# Patient Record
Sex: Male | Born: 1935 | Race: White | Hispanic: No | Marital: Married | Smoking: Never smoker
Health system: Southern US, Community
[De-identification: ages and names within clinical notes are randomized; demographics above are authoritative.]

## PROBLEM LIST (undated history)

## (undated) DIAGNOSIS — R1310 Dysphagia, unspecified: Secondary | ICD-10-CM

## (undated) DIAGNOSIS — G459 Transient cerebral ischemic attack, unspecified: Secondary | ICD-10-CM

## (undated) DIAGNOSIS — M5136 Other intervertebral disc degeneration, lumbar region: Secondary | ICD-10-CM

## (undated) DIAGNOSIS — N183 Chronic kidney disease, stage 3 unspecified: Secondary | ICD-10-CM

## (undated) DIAGNOSIS — E785 Hyperlipidemia, unspecified: Secondary | ICD-10-CM

## (undated) DIAGNOSIS — M545 Low back pain: Secondary | ICD-10-CM

## (undated) DIAGNOSIS — I10 Essential (primary) hypertension: Secondary | ICD-10-CM

## (undated) DIAGNOSIS — K1120 Sialoadenitis, unspecified: Secondary | ICD-10-CM

## (undated) DIAGNOSIS — Z741 Need for assistance with personal care: Secondary | ICD-10-CM

## (undated) DIAGNOSIS — R296 Repeated falls: Secondary | ICD-10-CM

## (undated) DIAGNOSIS — R4182 Altered mental status, unspecified: Secondary | ICD-10-CM

## (undated) DIAGNOSIS — M51369 Other intervertebral disc degeneration, lumbar region without mention of lumbar back pain or lower extremity pain: Secondary | ICD-10-CM

## (undated) DIAGNOSIS — G8929 Other chronic pain: Secondary | ICD-10-CM

## (undated) DIAGNOSIS — E119 Type 2 diabetes mellitus without complications: Secondary | ICD-10-CM

## (undated) DIAGNOSIS — F0390 Unspecified dementia without behavioral disturbance: Secondary | ICD-10-CM

## (undated) DIAGNOSIS — C801 Malignant (primary) neoplasm, unspecified: Secondary | ICD-10-CM

## (undated) DIAGNOSIS — G40909 Epilepsy, unspecified, not intractable, without status epilepticus: Secondary | ICD-10-CM

## (undated) HISTORY — PX: BRAIN TUMOR EXCISION: SHX577

---

## 1997-12-04 ENCOUNTER — Other Ambulatory Visit: Admission: RE | Admit: 1997-12-04 | Discharge: 1997-12-04 | Payer: Self-pay | Admitting: Internal Medicine

## 1998-05-08 ENCOUNTER — Encounter: Payer: Self-pay | Admitting: Internal Medicine

## 1998-05-08 ENCOUNTER — Ambulatory Visit (HOSPITAL_COMMUNITY): Admission: RE | Admit: 1998-05-08 | Discharge: 1998-05-08 | Payer: Self-pay | Admitting: Internal Medicine

## 2000-08-07 HISTORY — PX: BRAIN MENINGIOMA EXCISION: SHX576

## 2001-04-16 ENCOUNTER — Encounter (INDEPENDENT_AMBULATORY_CARE_PROVIDER_SITE_OTHER): Payer: Self-pay | Admitting: Specialist

## 2001-04-16 ENCOUNTER — Ambulatory Visit (HOSPITAL_COMMUNITY): Admission: RE | Admit: 2001-04-16 | Discharge: 2001-04-16 | Payer: Self-pay | Admitting: Gastroenterology

## 2009-05-06 ENCOUNTER — Inpatient Hospital Stay (HOSPITAL_COMMUNITY): Admission: EM | Admit: 2009-05-06 | Discharge: 2009-05-15 | Payer: Self-pay | Admitting: Emergency Medicine

## 2009-05-06 ENCOUNTER — Emergency Department (HOSPITAL_COMMUNITY): Admission: EM | Admit: 2009-05-06 | Discharge: 2009-05-06 | Payer: Self-pay | Admitting: Emergency Medicine

## 2009-06-26 ENCOUNTER — Encounter (HOSPITAL_BASED_OUTPATIENT_CLINIC_OR_DEPARTMENT_OTHER): Admission: RE | Admit: 2009-06-26 | Discharge: 2009-08-14 | Payer: Self-pay | Admitting: Internal Medicine

## 2010-08-25 LAB — GLUCOSE, CAPILLARY: Glucose-Capillary: 104 mg/dL — ABNORMAL HIGH (ref 70–99)

## 2010-09-10 LAB — BASIC METABOLIC PANEL
BUN: 13 mg/dL (ref 6–23)
BUN: 17 mg/dL (ref 6–23)
CO2: 29 mEq/L (ref 19–32)
Calcium: 10.4 mg/dL (ref 8.4–10.5)
Chloride: 101 mEq/L (ref 96–112)
Chloride: 102 mEq/L (ref 96–112)
Creatinine, Ser: 1.71 mg/dL — ABNORMAL HIGH (ref 0.4–1.5)
GFR calc Af Amer: 48 mL/min — ABNORMAL LOW (ref >60)
GFR calc non Af Amer: 39 mL/min — ABNORMAL LOW (ref >60)
GFR calc non Af Amer: 41 mL/min — ABNORMAL LOW (ref >60)
Glucose, Bld: 132 mg/dL — ABNORMAL HIGH (ref 70–99)
Glucose, Bld: 149 mg/dL — ABNORMAL HIGH (ref 70–99)
Glucose, Bld: 150 mg/dL — ABNORMAL HIGH (ref 70–99)
Potassium: 3.9 mEq/L (ref 3.5–5.1)
Potassium: 4.2 mEq/L (ref 3.5–5.1)
Potassium: 4.3 mEq/L (ref 3.5–5.1)
Sodium: 136 mEq/L (ref 135–145)
Sodium: 136 mEq/L (ref 135–145)

## 2010-09-10 LAB — GLUCOSE, CAPILLARY
Glucose-Capillary: 107 mg/dL — ABNORMAL HIGH (ref 70–99)
Glucose-Capillary: 121 mg/dL — ABNORMAL HIGH (ref 70–99)
Glucose-Capillary: 123 mg/dL — ABNORMAL HIGH (ref 70–99)
Glucose-Capillary: 125 mg/dL — ABNORMAL HIGH (ref 70–99)
Glucose-Capillary: 128 mg/dL — ABNORMAL HIGH (ref 70–99)
Glucose-Capillary: 131 mg/dL — ABNORMAL HIGH (ref 70–99)
Glucose-Capillary: 133 mg/dL — ABNORMAL HIGH (ref 70–99)
Glucose-Capillary: 138 mg/dL — ABNORMAL HIGH (ref 70–99)
Glucose-Capillary: 143 mg/dL — ABNORMAL HIGH (ref 70–99)
Glucose-Capillary: 147 mg/dL — ABNORMAL HIGH (ref 70–99)
Glucose-Capillary: 148 mg/dL — ABNORMAL HIGH (ref 70–99)
Glucose-Capillary: 151 mg/dL — ABNORMAL HIGH (ref 70–99)
Glucose-Capillary: 165 mg/dL — ABNORMAL HIGH (ref 70–99)
Glucose-Capillary: 167 mg/dL — ABNORMAL HIGH (ref 70–99)
Glucose-Capillary: 172 mg/dL — ABNORMAL HIGH (ref 70–99)
Glucose-Capillary: 194 mg/dL — ABNORMAL HIGH (ref 70–99)
Glucose-Capillary: 196 mg/dL — ABNORMAL HIGH (ref 70–99)
Glucose-Capillary: 294 mg/dL — ABNORMAL HIGH (ref 70–99)

## 2010-09-10 LAB — PROTEIN AND GLUCOSE, CSF: Total  Protein, CSF: 38 mg/dL (ref 15–45)

## 2010-09-10 LAB — VITAMIN B1: Vitamin B1 (Thiamine): 12 nmol/L (ref 9–44)

## 2010-09-10 LAB — RPR: RPR Ser Ql: NONREACTIVE

## 2010-09-10 LAB — CRYPTOCOCCAL ANTIGEN, CSF: Crypto Ag: NEGATIVE

## 2010-09-10 LAB — CBC
HCT: 41.6 % (ref 39.0–52.0)
HCT: 42.9 % (ref 39.0–52.0)
Hemoglobin: 14.2 g/dL (ref 13.0–17.0)
MCV: 93.5 fL (ref 78.0–100.0)
Platelets: 167 10*3/uL (ref 150–400)
RBC: 4.48 MIL/uL (ref 4.22–5.81)
RDW: 13.4 % (ref 11.5–15.5)
RDW: 13.6 % (ref 11.5–15.5)
WBC: 4.9 10*3/uL (ref 4.0–10.5)

## 2010-09-10 LAB — LIPID PANEL
Cholesterol: 209 mg/dL — ABNORMAL HIGH (ref 0–200)
LDL Cholesterol: 148 mg/dL — ABNORMAL HIGH (ref 0–99)
Triglycerides: 105 mg/dL (ref <150)

## 2010-09-10 LAB — PHENYTOIN LEVEL, TOTAL: Phenytoin Lvl: 7 ug/mL — ABNORMAL LOW (ref 10.0–20.0)

## 2010-09-10 LAB — CSF CELL COUNT WITH DIFFERENTIAL
RBC Count, CSF: 3 /mm3 — ABNORMAL HIGH
WBC, CSF: 1 /mm3 (ref 0–5)

## 2010-09-10 LAB — VITAMIN B12: Vitamin B-12: 383 pg/mL (ref 211–911)

## 2010-09-10 LAB — T4, FREE: Free T4: 1.36 ng/dL (ref 0.80–1.80)

## 2010-09-10 LAB — HEMOGLOBIN A1C
Hgb A1c MFr Bld: 5.7 % (ref 4.6–6.1)
Mean Plasma Glucose: 117 mg/dL

## 2010-09-11 LAB — GLUCOSE, CAPILLARY
Glucose-Capillary: 100 mg/dL — ABNORMAL HIGH (ref 70–99)
Glucose-Capillary: 116 mg/dL — ABNORMAL HIGH (ref 70–99)
Glucose-Capillary: 118 mg/dL — ABNORMAL HIGH (ref 70–99)
Glucose-Capillary: 126 mg/dL — ABNORMAL HIGH (ref 70–99)
Glucose-Capillary: 135 mg/dL — ABNORMAL HIGH (ref 70–99)
Glucose-Capillary: 138 mg/dL — ABNORMAL HIGH (ref 70–99)
Glucose-Capillary: 138 mg/dL — ABNORMAL HIGH (ref 70–99)
Glucose-Capillary: 150 mg/dL — ABNORMAL HIGH (ref 70–99)
Glucose-Capillary: 52 mg/dL — ABNORMAL LOW (ref 70–99)
Glucose-Capillary: 55 mg/dL — ABNORMAL LOW (ref 70–99)
Glucose-Capillary: 75 mg/dL (ref 70–99)
Glucose-Capillary: 93 mg/dL (ref 70–99)

## 2010-09-11 LAB — POCT CARDIAC MARKERS
CKMB, poc: 2.5 ng/mL (ref 1.0–8.0)
Troponin i, poc: <0.05 ng/mL (ref 0.00–0.09)

## 2010-09-11 LAB — COMPREHENSIVE METABOLIC PANEL WITH GFR
Alkaline Phosphatase: 61 U/L (ref 39–117)
BUN: 18 mg/dL (ref 6–23)
CO2: 27 meq/L (ref 19–32)
Calcium: 9.9 mg/dL (ref 8.4–10.5)
GFR calc non Af Amer: 41 mL/min — ABNORMAL LOW (ref >60)
Glucose, Bld: 53 mg/dL — ABNORMAL LOW (ref 70–99)
Potassium: 4.4 meq/L (ref 3.5–5.1)
Total Protein: 7.2 g/dL (ref 6.0–8.3)

## 2010-09-11 LAB — URINALYSIS, ROUTINE W REFLEX MICROSCOPIC
Protein, ur: 100 mg/dL — AB
Urobilinogen, UA: 0.2 mg/dL (ref 0.0–1.0)

## 2010-09-11 LAB — DIFFERENTIAL
Basophils Absolute: 0 K/uL (ref 0.0–0.1)
Basophils Relative: 0 % (ref 0–1)
Basophils Relative: 0 % (ref 0–1)
Eosinophils Absolute: 0 10*3/uL (ref 0.0–0.7)
Eosinophils Absolute: 0 10*3/uL (ref 0.0–0.7)
Eosinophils Relative: 0 % (ref 0–5)
Lymphocytes Relative: 17 % (ref 12–46)
Lymphs Abs: 0.9 10*3/uL (ref 0.7–4.0)
Monocytes Absolute: 0.2 10*3/uL (ref 0.1–1.0)
Monocytes Relative: 4 % (ref 3–12)
Monocytes Relative: 8 % (ref 3–12)
Neutro Abs: 4.3 K/uL (ref 1.7–7.7)
Neutrophils Relative %: 64 % (ref 43–77)
Neutrophils Relative %: 79 % — ABNORMAL HIGH (ref 43–77)

## 2010-09-11 LAB — COMPREHENSIVE METABOLIC PANEL
ALT: 14 U/L (ref 0–53)
ALT: 14 U/L (ref 0–53)
AST: 21 U/L (ref 0–37)
Albumin: 3.8 g/dL (ref 3.5–5.2)
Alkaline Phosphatase: 58 U/L (ref 39–117)
CO2: 27 mEq/L (ref 19–32)
Chloride: 107 mEq/L (ref 96–112)
Chloride: 109 mEq/L (ref 96–112)
Creatinine, Ser: 1.65 mg/dL — ABNORMAL HIGH (ref 0.4–1.5)
GFR calc Af Amer: 49 mL/min — ABNORMAL LOW (ref >60)
GFR calc non Af Amer: 39 mL/min — ABNORMAL LOW (ref >60)
Glucose, Bld: 67 mg/dL — ABNORMAL LOW (ref 70–99)
Potassium: 4.3 mEq/L (ref 3.5–5.1)
Sodium: 139 mEq/L (ref 135–145)
Sodium: 140 mEq/L (ref 135–145)
Total Bilirubin: 0.6 mg/dL (ref 0.3–1.2)
Total Bilirubin: 0.9 mg/dL (ref 0.3–1.2)

## 2010-09-11 LAB — BASIC METABOLIC PANEL
BUN: 14 mg/dL (ref 6–23)
BUN: 15 mg/dL (ref 6–23)
Calcium: 10.1 mg/dL (ref 8.4–10.5)
Chloride: 103 mEq/L (ref 96–112)
Chloride: 105 mEq/L (ref 96–112)
Creatinine, Ser: 1.54 mg/dL — ABNORMAL HIGH (ref 0.4–1.5)
Creatinine, Ser: 1.67 mg/dL — ABNORMAL HIGH (ref 0.4–1.5)
GFR calc Af Amer: 53 mL/min — ABNORMAL LOW (ref >60)
Glucose, Bld: 156 mg/dL — ABNORMAL HIGH (ref 70–99)
Potassium: 4.6 mEq/L (ref 3.5–5.1)

## 2010-09-11 LAB — LIPASE, BLOOD: Lipase: 36 U/L (ref 11–59)

## 2010-09-11 LAB — CBC
HCT: 38.8 % — ABNORMAL LOW (ref 39.0–52.0)
HCT: 38.8 % — ABNORMAL LOW (ref 39.0–52.0)
Hemoglobin: 13.1 g/dL (ref 13.0–17.0)
Hemoglobin: 13.2 g/dL (ref 13.0–17.0)
MCHC: 34 g/dL (ref 30.0–36.0)
MCHC: 34 g/dL (ref 30.0–36.0)
MCV: 92.7 fL (ref 78.0–100.0)
MCV: 93.2 fL (ref 78.0–100.0)
MCV: 93.7 fL (ref 78.0–100.0)
Platelets: 147 10*3/uL — ABNORMAL LOW (ref 150–400)
Platelets: 152 10*3/uL (ref 150–400)
Platelets: 153 10*3/uL (ref 150–400)
RBC: 4.11 MIL/uL — ABNORMAL LOW (ref 4.22–5.81)
RBC: 4.17 MIL/uL — ABNORMAL LOW (ref 4.22–5.81)
RBC: 4.23 MIL/uL (ref 4.22–5.81)
RDW: 13.4 % (ref 11.5–15.5)
RDW: 13.6 % (ref 11.5–15.5)
WBC: 4.4 10*3/uL (ref 4.0–10.5)
WBC: 5.4 10*3/uL (ref 4.0–10.5)

## 2010-09-11 LAB — HEMOGLOBIN A1C
Hgb A1c MFr Bld: 5.9 % (ref 4.6–6.1)
Mean Plasma Glucose: 123 mg/dL

## 2010-09-11 LAB — URINE MICROSCOPIC-ADD ON

## 2010-09-11 LAB — URINE CULTURE: Colony Count: NO GROWTH

## 2010-10-25 NOTE — Procedures (Signed)
[facility]  Patient:    Kurt [name], Kurt [name] Visit Number: [id number] MRN: [id number]          Service Type: END Location: ENDO Attending Physician:  Orland Mustard Dictated by:   Llana Aliment. Randa Evens, M.D. Proc. Date: 04/16/01 Admit Date:  04/16/2001 Discharge Date: 04/16/2001   CC:         Dr. Jackquline Bosch   Procedure Report  PREOPERATIVE DIAGNOSIS:  POSTOPERATIVE DIAGNOSIS:  OPERATION/PROCEDURE:  Colonoscopy and polypectomy.  SURGEON:  James L. Randa Evens, M.D.  MEDICATIONS:  Fentanyl 75 uCG, Versed 8 mg IV.  INDICATIONS:  The patient with a history of colon polyps.  He refused repeat colonoscopy.  He has a strong family history of colon cancer.  This is done as a follow up.  SCOPE:  Adult Olympus videocolonoscope.  DESCRIPTION OF PROCEDURE:  The procedure had been explained to the patient and consent obtained.  With the patient in the left lateral decubitus position the Olympus adult videocolonoscope was inserted and advanced under direct visualization.  Prep excellent.  We were able to advance to the cecum.  The ileocecal valve and appendiceal orifice seen.  The scope withdrawn just above the cecum in the ascending colon.  A 0.5 cm sessile polyp was cauterized.  The scope was withdrawn and the remainder of the ascending colon, transverse colon, splenic flexure, and descending colon were seen well at 60 cm from the anal verge of the descending colon, approximately 1/2 somewhat larger sessile polyp was encountered.  It was removed with the snare and, we felt, sucked up against the scope and it was lost.  Despite multiple passes in this area I could never locate it.  The prep was somewhat marginal in this area and it was felt that it got back behind the fold of some stool.  There was no bleeding at either of the sites. No polyps were seen in the sigmoid colon or rectum.  The scope was withdrawn.  The patient tolerated the procedure well and  was maintained on low-flow oxygen and pulse oximeter throughout the procedure with no obvious problems.  ASSESSMENT: Two polyps removed.  One unfortunately lost  PLAN:  Routine postpolypectomy instructions.  We recommend repeating in three years. Dictated by:   Llana Aliment. Randa Evens, M.D. Attending Physician:  Orland Mustard DD:  04/16/01 TD:  04/18/01 Job: [id number]

## 2012-08-30 ENCOUNTER — Inpatient Hospital Stay (HOSPITAL_COMMUNITY)
Admission: EM | Admit: 2012-08-30 | Discharge: 2012-09-02 | DRG: 101 | Disposition: A | Discharge status: Skilled Nursing Facility | Payer: Medicare Other | Attending: Internal Medicine | Admitting: Internal Medicine

## 2012-08-30 ENCOUNTER — Emergency Department (HOSPITAL_COMMUNITY): Payer: Medicare Other

## 2012-08-30 ENCOUNTER — Encounter (HOSPITAL_COMMUNITY): Payer: Self-pay | Admitting: *Deleted

## 2012-08-30 DIAGNOSIS — N183 Chronic kidney disease, stage 3 unspecified: Secondary | ICD-10-CM

## 2012-08-30 DIAGNOSIS — G40909 Epilepsy, unspecified, not intractable, without status epilepticus: Secondary | ICD-10-CM | POA: Diagnosis present

## 2012-08-30 DIAGNOSIS — Z833 Family history of diabetes mellitus: Secondary | ICD-10-CM

## 2012-08-30 DIAGNOSIS — D320 Benign neoplasm of cerebral meninges: Secondary | ICD-10-CM | POA: Diagnosis present

## 2012-08-30 DIAGNOSIS — I10 Essential (primary) hypertension: Secondary | ICD-10-CM

## 2012-08-30 DIAGNOSIS — IMO0001 Reserved for inherently not codable concepts without codable children: Secondary | ICD-10-CM | POA: Diagnosis present

## 2012-08-30 DIAGNOSIS — G40802 Other epilepsy, not intractable, without status epilepticus: Principal | ICD-10-CM | POA: Diagnosis present

## 2012-08-30 DIAGNOSIS — D696 Thrombocytopenia, unspecified: Secondary | ICD-10-CM | POA: Diagnosis present

## 2012-08-30 DIAGNOSIS — M545 Low back pain, unspecified: Secondary | ICD-10-CM | POA: Diagnosis present

## 2012-08-30 DIAGNOSIS — G8929 Other chronic pain: Secondary | ICD-10-CM | POA: Diagnosis present

## 2012-08-30 DIAGNOSIS — I129 Hypertensive chronic kidney disease with stage 1 through stage 4 chronic kidney disease, or unspecified chronic kidney disease: Secondary | ICD-10-CM | POA: Diagnosis present

## 2012-08-30 DIAGNOSIS — Z8249 Family history of ischemic heart disease and other diseases of the circulatory system: Secondary | ICD-10-CM

## 2012-08-30 DIAGNOSIS — D631 Anemia in chronic kidney disease: Secondary | ICD-10-CM | POA: Diagnosis present

## 2012-08-30 DIAGNOSIS — G8384 Todd's paralysis (postepileptic): Secondary | ICD-10-CM

## 2012-08-30 DIAGNOSIS — Z66 Do not resuscitate: Secondary | ICD-10-CM | POA: Diagnosis present

## 2012-08-30 DIAGNOSIS — E785 Hyperlipidemia, unspecified: Secondary | ICD-10-CM

## 2012-08-30 DIAGNOSIS — F0390 Unspecified dementia without behavioral disturbance: Secondary | ICD-10-CM | POA: Diagnosis present

## 2012-08-30 DIAGNOSIS — R569 Unspecified convulsions: Secondary | ICD-10-CM

## 2012-08-30 DIAGNOSIS — E119 Type 2 diabetes mellitus without complications: Secondary | ICD-10-CM

## 2012-08-30 DIAGNOSIS — G8389 Other specified paralytic syndromes: Secondary | ICD-10-CM | POA: Diagnosis present

## 2012-08-30 HISTORY — DX: Other chronic pain: G89.29

## 2012-08-30 HISTORY — DX: Chronic kidney disease, stage 3 (moderate): N18.3

## 2012-08-30 HISTORY — DX: Hyperlipidemia, unspecified: E78.5

## 2012-08-30 HISTORY — DX: Low back pain: M54.5

## 2012-08-30 HISTORY — DX: Type 2 diabetes mellitus without complications: E11.9

## 2012-08-30 HISTORY — DX: Chronic kidney disease, stage 3 unspecified: N18.30

## 2012-08-30 HISTORY — DX: Epilepsy, unspecified, not intractable, without status epilepticus: G40.909

## 2012-08-30 HISTORY — DX: Essential (primary) hypertension: I10

## 2012-08-30 HISTORY — DX: Altered mental status, unspecified: R41.82

## 2012-08-30 LAB — GLUCOSE, CAPILLARY
Glucose-Capillary: 213 mg/dL — ABNORMAL HIGH (ref 70–99)
Glucose-Capillary: 162 mg/dL — ABNORMAL HIGH (ref 70–99)

## 2012-08-30 LAB — DIFFERENTIAL
Basophils Absolute: 0 10*3/uL (ref 0.0–0.1)
Basophils Relative: 0 % (ref 0–1)
Eosinophils Absolute: 0 10*3/uL (ref 0.0–0.7)
Eosinophils Relative: 0 % (ref 0–5)
Monocytes Absolute: 1.1 10*3/uL — ABNORMAL HIGH (ref 0.1–1.0)

## 2012-08-30 LAB — POCT I-STAT, CHEM 8
Creatinine, Ser: 1.9 mg/dL — ABNORMAL HIGH (ref 0.50–1.35)
Hemoglobin: 11.2 g/dL — ABNORMAL LOW (ref 13.0–17.0)
Potassium: 4.2 mEq/L (ref 3.5–5.1)
Sodium: 139 mEq/L (ref 135–145)

## 2012-08-30 LAB — LACTIC ACID, PLASMA: Lactic Acid, Venous: 2.4 mmol/L — ABNORMAL HIGH (ref 0.5–2.2)

## 2012-08-30 LAB — CBC
HCT: 30.6 % — ABNORMAL LOW (ref 39.0–52.0)
MCHC: 35.3 g/dL (ref 30.0–36.0)
MCV: 87.4 fL (ref 78.0–100.0)
RDW: 13.4 % (ref 11.5–15.5)

## 2012-08-30 LAB — PROTIME-INR: Prothrombin Time: 15.9 seconds — ABNORMAL HIGH (ref 11.6–15.2)

## 2012-08-30 LAB — COMPREHENSIVE METABOLIC PANEL
AST: 10 U/L (ref 0–37)
Albumin: 3.4 g/dL — ABNORMAL LOW (ref 3.5–5.2)
CO2: 21 mEq/L (ref 19–32)
Calcium: 9.5 mg/dL (ref 8.4–10.5)
Creatinine, Ser: 1.91 mg/dL — ABNORMAL HIGH (ref 0.50–1.35)
GFR calc non Af Amer: 31 mL/min — ABNORMAL LOW (ref >90)
Total Protein: 7.6 g/dL (ref 6.0–8.3)

## 2012-08-30 LAB — PHENYTOIN LEVEL, TOTAL: Phenytoin Lvl: 38 ug/mL (ref 10.0–20.0)

## 2012-08-30 LAB — POCT I-STAT TROPONIN I

## 2012-08-30 LAB — TROPONIN I: Troponin I: 0.39 ng/mL (ref <0.30)

## 2012-08-30 MED ORDER — INSULIN ASPART 100 UNIT/ML ~~LOC~~ SOLN: 0.0000 [IU] | Freq: Every day | SUBCUTANEOUS | Status: DC

## 2012-08-30 MED ORDER — ACETAMINOPHEN 325 MG PO TABS: 650.0000 mg | ORAL_TABLET | Freq: Four times a day (QID) | ORAL | Status: DC | PRN

## 2012-08-30 MED ORDER — ENOXAPARIN SODIUM 40 MG/0.4ML ~~LOC~~ SOLN
40.0000 mg | SUBCUTANEOUS | Status: DC
  Administered 2012-08-30 – 2012-09-01 (×3): 40 mg via SUBCUTANEOUS
  Filled 2012-08-30 (×4): qty 0.4

## 2012-08-30 MED ORDER — VITAMIN D3 25 MCG (1000 UNIT) PO TABS
5000.0000 [IU] | ORAL_TABLET | Freq: Every day | ORAL | Status: DC
  Administered 2012-08-30 – 2012-09-02 (×4): 5000 [IU] via ORAL
  Filled 2012-08-30 (×4): qty 5

## 2012-08-30 MED ORDER — SODIUM CHLORIDE 0.9 % IJ SOLN
3.0000 mL | Freq: Two times a day (BID) | INTRAMUSCULAR | Status: DC
  Administered 2012-08-30: 3 mL via INTRAVENOUS
  Administered 2012-08-31: 10:00:00 via INTRAVENOUS
  Administered 2012-08-31 – 2012-09-01 (×3): 3 mL via INTRAVENOUS

## 2012-08-30 MED ORDER — ATENOLOL 100 MG PO TABS
100.0000 mg | ORAL_TABLET | Freq: Every day | ORAL | Status: DC
  Administered 2012-08-30: 100 mg via ORAL
  Filled 2012-08-30 (×2): qty 1

## 2012-08-30 MED ORDER — LOSARTAN POTASSIUM 50 MG PO TABS
100.0000 mg | ORAL_TABLET | Freq: Every day | ORAL | Status: DC
  Administered 2012-08-30: 100 mg via ORAL
  Filled 2012-08-30 (×2): qty 2

## 2012-08-30 MED ORDER — SIMVASTATIN 40 MG PO TABS
40.0000 mg | ORAL_TABLET | Freq: Every day | ORAL | Status: DC
  Administered 2012-08-30 – 2012-09-01 (×3): 40 mg via ORAL
  Filled 2012-08-30 (×4): qty 1

## 2012-08-30 MED ORDER — FINASTERIDE 5 MG PO TABS
5.0000 mg | ORAL_TABLET | Freq: Every day | ORAL | Status: DC
  Administered 2012-08-30 – 2012-09-02 (×4): 5 mg via ORAL
  Filled 2012-08-30 (×4): qty 1

## 2012-08-30 MED ORDER — ASPIRIN EC 81 MG PO TBEC
81.0000 mg | DELAYED_RELEASE_TABLET | Freq: Every day | ORAL | Status: DC
  Administered 2012-08-30 – 2012-09-02 (×4): 81 mg via ORAL
  Filled 2012-08-30 (×4): qty 1

## 2012-08-30 MED ORDER — INSULIN DETEMIR 100 UNIT/ML ~~LOC~~ SOLN
5.0000 [IU] | Freq: Every day | SUBCUTANEOUS | Status: DC
  Administered 2012-08-30 – 2012-09-01 (×3): 5 [IU] via SUBCUTANEOUS
  Filled 2012-08-30 (×4): qty 0.05

## 2012-08-30 MED ORDER — ONDANSETRON HCL 4 MG/2ML IJ SOLN: 4.0000 mg | Freq: Four times a day (QID) | INTRAMUSCULAR | Status: DC | PRN

## 2012-08-30 MED ORDER — INSULIN ASPART 100 UNIT/ML ~~LOC~~ SOLN
0.0000 [IU] | Freq: Three times a day (TID) | SUBCUTANEOUS | Status: DC
  Administered 2012-09-01: 1 [IU] via SUBCUTANEOUS
  Administered 2012-09-01: 3 [IU] via SUBCUTANEOUS

## 2012-08-30 MED ORDER — HYDROCHLOROTHIAZIDE 12.5 MG PO CAPS
12.5000 mg | ORAL_CAPSULE | Freq: Every day | ORAL | Status: DC
  Administered 2012-08-30: 12.5 mg via ORAL
  Filled 2012-08-30 (×2): qty 1

## 2012-08-30 MED ORDER — ONDANSETRON HCL 4 MG PO TABS: 4.0000 mg | ORAL_TABLET | Freq: Four times a day (QID) | ORAL | Status: DC | PRN

## 2012-08-30 MED ORDER — LEVETIRACETAM 500 MG PO TABS
500.0000 mg | ORAL_TABLET | Freq: Two times a day (BID) | ORAL | Status: DC
  Administered 2012-08-30 – 2012-09-02 (×6): 500 mg via ORAL
  Filled 2012-08-30 (×7): qty 1

## 2012-08-30 MED ORDER — SODIUM CHLORIDE 0.9 % IV SOLN
1000.0000 mg | INTRAVENOUS | Status: AC
  Administered 2012-08-30: 1000 mg via INTRAVENOUS
  Filled 2012-08-30: qty 20

## 2012-08-30 MED ORDER — LOSARTAN POTASSIUM-HCTZ 100-12.5 MG PO TABS: 1.0000 | ORAL_TABLET | Freq: Every day | ORAL | Status: DC

## 2012-08-30 MED ORDER — ALBUTEROL SULFATE (5 MG/ML) 0.5% IN NEBU: 2.5000 mg | INHALATION_SOLUTION | RESPIRATORY_TRACT | Status: DC | PRN

## 2012-08-30 MED ORDER — ACETAMINOPHEN 650 MG RE SUPP: 650.0000 mg | Freq: Four times a day (QID) | RECTAL | Status: DC | PRN

## 2012-08-30 MED ORDER — SODIUM CHLORIDE 0.9 % IV SOLN
INTRAVENOUS | Status: AC
  Administered 2012-08-30: 19:00:00 via INTRAVENOUS

## 2012-08-30 NOTE — ED Notes (Signed)
Dr. Lavena Bullion pager # [phone]

## 2012-08-30 NOTE — ED Notes (Signed)
EMS states that patient was very warm to touch. Patient's sheets were soaked in what appeared to be sweat. "Strong bacteria like smelling odor coming from urine"

## 2012-08-30 NOTE — ED Notes (Signed)
Per EMS: Patient called out by family for seizure like activity. Patient's left side of body was flaccid on scene. Pt able to state his name and birthday at CT scanner.

## 2012-08-30 NOTE — Procedures (Signed)
History: 79 year old male with a history of frontal meningioma. He had a seizure this morning, and has taken a prolonged time for his left side to return to baseline. Concern for possible partial status epilepticus.  Sedation: None  Background: There is a well defined posterior dominant rhythm of 8.5 Hz that attenuates with eye opening. There is generalized irregular delta which is more prominent in the right side. He also has independent sharp waves in the right frontotemporal and left frontal regions.  Photic stimulation: Physiologic driving is not performed  EEG Abnormalities: 1) right frontotemporal sharp waves 2) left frontal sharp waves 3) right greater than left generalized irregular delta  Clinical Interpretation: This EEG is consistent with bilateral independent foci of potential epileptogenicity the right frontotemporal and left frontal regions. There is also evidence of right greater than left posterior dysfunction which could be consistent with his postictal state. There is no sign of ongoing seizure activity.  Ritta Slot, MD [facility] [phone]  If 7pm- 7am, please page neurology on call at [phone].

## 2012-08-30 NOTE — Progress Notes (Signed)
[facility] ED Cm covering for [facility] Cm consulted by [facility] about medication assistance for pt Reports pt is being admitted to [facility]. Cm reviewed pt chart Pt is listed as BLUE MEDICARE [facility] therefore is not eligible for [facility] MATCH program for medication assistance CM paged Dr Clifton Custard [phone] Pending return call from MD

## 2012-08-30 NOTE — ED Provider Notes (Signed)
History     CSN: [id number]  Arrival date & time 08/30/12  4098   First MD Initiated Contact with Patient 08/30/12 [phone]      Chief Complaint  Patient presents with  . Seizures    (Consider location/radiation/quality/duration/timing/severity/associated sxs/prior treatment) Patient is a 79 y.o. [name] presenting with seizures. The history is provided by the patient. No language interpreter was used.  Seizures Seizure activity on arrival: no   Seizure type:  Focal Initial focality:  None Severity:  Moderate Timing:  Once Progression:  Worsening Recent head injury:  No recent head injuries Pt is reported by EMS to have had a seizure at home.   Pt has a history of seizuresbut has not had a seizure in several months.   Pt has a history of a tumor on his brain.  No past medical history on file.  No past surgical history on file.  No family history on file.  History  Substance Use Topics  . Smoking status: Not on file  . Smokeless tobacco: Not on file  . Alcohol Use: Not on file      Review of Systems  Neurological: Positive for seizures.  All other systems reviewed and are negative.    Allergies  Review of patient's allergies indicates not on file.  Home Medications  No current outpatient prescriptions on file.  BP 199/85  Temp(Src) 98.3 F (36.8 C) (Oral)  Resp 21  SpO2 100%  Physical Exam  Nursing note and vitals reviewed. Constitutional: He appears well-developed and well-nourished.  HENT:  Head: Normocephalic.  Right Ear: External ear normal.  Left Ear: External ear normal.  Nose: Nose normal.  Mouth/Throat: Oropharynx is clear and moist.  Eyes: Conjunctivae are normal. Pupils are equal, round, and reactive to light.  Neck: Normal range of motion. Neck supple.  Cardiovascular: Normal rate and normal heart sounds.   Pulmonary/Chest: Effort normal.  Abdominal: Soft.  Musculoskeletal: He exhibits no tenderness.  Decreased range of motion left leg,   Weak when trying to lift.  Neurological: He is alert.  Skin: Skin is warm.  Psychiatric: He has a normal mood and affect.    ED Course  Procedures (including critical care time)  Labs Reviewed  PROTIME-INR - Abnormal; Notable for the following:    Prothrombin Time 15.9 (*)    All other components within normal limits  APTT - Abnormal; Notable for the following:    aPTT 39 (*)    All other components within normal limits  CBC - Abnormal; Notable for the following:    WBC 12.4 (*)    RBC 3.50 (*)    Hemoglobin 10.8 (*)    HCT 30.6 (*)    Platelets 128 (*)    All other components within normal limits  DIFFERENTIAL - Abnormal; Notable for the following:    Neutrophils Relative 87 (*)    Neutro Abs 10.8 (*)    Lymphocytes Relative 4 (*)    Lymphs Abs 0.5 (*)    Monocytes Absolute 1.1 (*)    All other components within normal limits  GLUCOSE, CAPILLARY - Abnormal; Notable for the following:    Glucose-Capillary 213 (*)    All other components within normal limits  POCT I-STAT, CHEM 8 - Abnormal; Notable for the following:    BUN 28 (*)    Creatinine, Ser 1.90 (*)    Glucose, Bld 231 (*)    Hemoglobin 11.2 (*)    HCT 33.0 (*)    All other components  within normal limits  POCT I-STAT TROPONIN I - Abnormal; Notable for the following:    Troponin i, poc 0.50 (*)    All other components within normal limits  COMPREHENSIVE METABOLIC PANEL  TROPONIN I  URINALYSIS, ROUTINE W REFLEX MICROSCOPIC  LACTIC ACID, PLASMA   No results found.   No diagnosis found.    MDM   Results for orders placed during the hospital encounter of 08/30/12  PROTIME-INR      Result Value Range   Prothrombin Time 15.9 (*) 11.6 - 15.2 seconds   INR 1.30  0.00 - 1.Kurt  APTT      Result Value Range   aPTT 39 (*) 24 - 37 seconds  CBC      Result Value Range   WBC 12.4 (*) 4.0 - 10.5 K/uL   RBC 3.50 (*) 4.22 - 5.81 MIL/uL   Hemoglobin 10.8 (*) 13.0 - 17.0 g/dL   HCT 96.0 (*) 45.4 - 09.8 %    MCV 87.4  78.0 - 100.0 fL   MCH 30.9  26.0 - 34.0 pg   MCHC 35.3  30.0 - 36.0 g/dL   RDW 11.9  14.7 - 82.9 %   Platelets 128 (*) 150 - 400 K/uL  DIFFERENTIAL      Result Value Range   Neutrophils Relative 87 (*) 43 - 77 %   Neutro Abs 10.8 (*) 1.7 - 7.7 K/uL   Lymphocytes Relative 4 (*) 12 - 46 %   Lymphs Abs 0.5 (*) 0.7 - 4.0 K/uL   Monocytes Relative 9  3 - 12 %   Monocytes Absolute 1.1 (*) 0.1 - 1.0 K/uL   Eosinophils Relative 0  0 - 5 %   Eosinophils Absolute 0.0  0.0 - 0.7 K/uL   Basophils Relative 0  0 - 1 %   Basophils Absolute 0.0  0.0 - 0.1 K/uL  COMPREHENSIVE METABOLIC PANEL      Result Value Range   Sodium 133 (*) 135 - 145 mEq/L   Potassium 3.9  3.5 - 5.1 mEq/L   Chloride 101  96 - 112 mEq/L   CO2 21  19 - 32 mEq/L   Glucose, Bld 230 (*) 70 - 99 mg/dL   BUN 28 (*) 6 - 23 mg/dL   Creatinine, Ser 5.62 (*) 0.50 - 1.35 mg/dL   Calcium 9.5  8.4 - 13.0 mg/dL   Total Protein 7.6  6.0 - 8.3 g/dL   Albumin 3.4 (*) 3.5 - 5.2 g/dL   AST 10  0 - 37 U/L   ALT 5  0 - 53 U/L   Alkaline Phosphatase 79  39 - 117 U/L   Total Bilirubin 0.5  0.3 - 1.2 mg/dL   GFR calc non Af Amer 31 (*) >90 mL/min   GFR calc Af Amer 36 (*) >90 mL/min  TROPONIN I      Result Value Range   Troponin I 0.58 (*) <0.30 ng/mL  LACTIC ACID, PLASMA      Result Value Range   Lactic Acid, Venous 2.4 (*) 0.5 - 2.2 mmol/L  GLUCOSE, CAPILLARY      Result Value Range   Glucose-Capillary 213 (*) 70 - 99 mg/dL  PHENYTOIN LEVEL, TOTAL      Result Value Range   Phenytoin Lvl 3.2 (*) 10.0 - 20.0 ug/mL  POCT I-STAT, CHEM 8      Result Value Range   Sodium 139  135 - 145 mEq/L   Potassium 4.2  3.5 -  5.1 mEq/L   Chloride 107  96 - 112 mEq/L   BUN 28 (*) 6 - 23 mg/dL   Creatinine, Ser 1.61 (*) 0.50 - 1.35 mg/dL   Glucose, Bld 096 (*) 70 - 99 mg/dL   Calcium, Ion 0.45  4.09 - 1.30 mmol/L   TCO2 22  0 - 100 mmol/L   Hemoglobin 11.2 (*) 13.0 - 17.0 g/dL   HCT 81.1 (*) 91.4 - 78.2 %  POCT I-STAT TROPONIN I       Result Value Range   Troponin i, poc 0.50 (*) 0.00 - 0.08 ng/mL   Comment NOTIFIED PHYSICIAN     Comment 3            Dg Chest 2 View  08/30/2012  *RADIOLOGY REPORT*  Clinical Data: Fever, hypertension, diabetes  CHEST - 2 VIEW  Comparison: 05/13/2009  Findings: Upper-normal size of cardiac silhouette. Mildly tortuous thoracic aorta with atherosclerotic calcification. Pulmonary vascularity normal. Lungs grossly clear. No pleural effusion, pneumothorax, or acute osseous abnormality.  IMPRESSION: No acute abnormalities.   Original Report Authenticated By: Ulyses Southward, M.D.    Ct Head Wo Contrast  08/30/2012  *RADIOLOGY REPORT*  Clinical Data: Left-sided weakness.  History of brain tumor.  CT HEAD WITHOUT CONTRAST  Technique:  Contiguous axial images were obtained from the base of the skull through the vertex without contrast.  Comparison: Head CT from 05/06/2009.  Brain MRI from 05/11/2009.  Findings: There is no evidence for an acute hemorrhage.  No hydrocephalus.  The bifrontal encephalomalacia with ex vacuo dilatation of both frontal horns is stable.  There is evidence of prior frontal craniotomy. There is progression of tumor along the superior cribiform plate, extending into the floor of the frontal skull.  The sphenoid sinuses are opacified.  On this CT scan from 05/06/2009, the right sphenoid sinus was opacified on today's study, both sphenoid sinuses are completely opacified and there is extension of the material in the posterior ethmoid air cells.  MRI from 05/11/2009 indicated that this represented residual tumor which has apparently progressed in the interval.  There is no interval substantial enlargement of the sphenoid sinuses and no gross destruction of the sphenoid or posterior ethmoid sinuses is evident.  IMPRESSION: Interval progression of tumor in the inferior frontal region with further expansion into the sphenoid sinuses and posterior ethmoid air cells.  Extensive postoperative  encephalomalacia in the frontal lobes with ex vacuo dilatation of the frontal horns.  This is not substantially changed in the interval.   Original Report Authenticated By: Kennith Center, M.D.    Mr Brain Wo Contrast  08/30/2012  *RADIOLOGY REPORT*  Clinical Data: 79 year old [name] with right side weakness.  Previous frontal craniotomy surgery for meningioma Renal failure precludes IV contrast today.  MRI HEAD WITHOUT CONTRAST  Technique:  Multiplanar, multiecho pulse sequences of the brain and surrounding structures were obtained according to standard protocol without intravenous contrast.  Comparison: Brain MRI without and with contrast 05/11/2009.  Head CT 08/30/2012, 05/06/2009.  Findings: Absence of IV contrast limits delineation of the residual skull base tumor.  Based on the 2010 exam, chronic meningioma at the planum sphenoidale owl and involving the posterior ethmoid and superior sphenoid sinuses is estimated to measure up to 43 x 51 x 37 mm (AP by transverse by CC).  Previously, enhancing component of the tumor at a similar level measured 43 x 53 x 36 mm.  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  As before, there are obstructed secretions in the sphenoid sinuses, and these have increased.  There appears to be more frank tumor extension into the right sphenoid, while the secretions on the left are felt to be inspissated.  Underlying sphenoid sinus hyperplasia again noted, they occupy most of the clivus.  Mildly restricted diffusion in the chronic tumor, especially at the right greater wing of the sphenoid. No restricted diffusion to suggest acute infarction.  Chronic by frontal encephalomalacia has progressed.  Ex vacuo enlargement of the ventricles has not significantly changed.  No midline shift. No acute intracranial hemorrhage identified.  Major intracranial vascular flow voids are preserved.     Stable gray and white matter signal elsewhere. Negative  cervicomedullary junction and visualized cervical spine.  Bone marrow signal outside the area of tumor infiltration is normal.  Orbit soft tissues within normal limits.  Maxillary sinuses and mastoids remain clear.  No acute scalp soft tissue abnormality.  IMPRESSION: 1.  No evidence of acute infarct. 2.  Chronic bulky skull base meningioma, with interval increased obstructed secretions in the sphenoid sinuses.  Gross tumor dimensions are not significantly changed since 2010 (tumor size today is estimated due to the lack of IV contrast). No associated cerebral edema. 3.  Progression of chronic bifrontal encephalomalacia which is at least in part postoperative. 4.  No new signal abnormality identified in the brain.   Original Report Authenticated By: Erskine Speed, M.D.     I discussed troponin with Dr. Jens Som.   Pt has no chest pain.  Troponin elevated due to seizure.    Ct scan returned and shows increase in menigoma   Pt has had improvement of symptoms but still has continued leg weakness.    Dr. Amada Jupiter Neurology consulted on pt,   MRi returned.   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    He advised Medicine admission      Elson Areas, PA-C 08/30/12 1357

## 2012-08-30 NOTE — Consult Note (Addendum)
NEURO HOSPITALIST CONSULT NOTE    Reason for Consult:Seizure  HPI:                                                                                                                                          Kurt [name] is an 79 y.o. male with known history of meningioma S/P resection and is followed by [facility] hospital.  He was last seen by [facility] about one year ago.  Family was told that meningioma has grown and Gama knife radiation would be next step.  Patient did not want any further treatment done. Patient comes to ED today after having witnessed seizure like activity. He had woken up this AM and apparently was diaphoretic and wife (who has known dementia ) told daughter she brought him water.  Daughter arrived at the house and noted he had bilateral UE arm jerking, staring forward but able to follow some commands and answer some questions.  He was brought to hospital for further evaluation.  Currently he is able to answer my questions, follow simple commands, he does not know the month or year and is neglecting his left arm and leg. Per out patient records he is on Dilantin but daughter at bedside was not sure about this. Patient lives at home with wife who has Dx dementia.  Dilantin level has returned at 3.2.     Past Medical History  Diagnosis Date  . Hypertension   . Diabetes mellitus without complication     Past Surgical History  Procedure Laterality Date  . Brain tumor excision      History reviewed. No pertinent family history.  Family History: Mother: HTN, DM Father HTN  Social History:  reports that he has never smoked. He does not have any smokeless tobacco history on file. He reports that he does not drink alcohol or use illicit drugs.  No Known Allergies  MEDICATIONS:                                                                                                                     No current facility-administered medications for this encounter.    Current Outpatient Prescriptions  Medication Sig Dispense Refill  . aspirin EC 81 MG tablet Take 81 mg by mouth daily.      Marland Kitchen  atenolol (TENORMIN) 100 MG tablet Take 100 mg by mouth daily.      . Cholecalciferol (D-3-5) 5000 UNITS capsule Take 5,000 Units by mouth daily.      . finasteride (PROSCAR) 5 MG tablet Take 5 mg by mouth daily.      Marland Kitchen losartan-hydrochlorothiazide (HYZAAR) 100-12.5 MG per tablet Take 1 tablet by mouth daily.      . metFORMIN (GLUMETZA) 500 MG (MOD) 24 hr tablet Take 1,000 mg by mouth 2 (two) times daily with a meal.      . phenytoin (DILANTIN) 100 MG ER capsule Take 200 mg by mouth 3 (three) times daily.      . simvastatin (ZOCOR) 40 MG tablet Take 40 mg by mouth every evening.          ROS:                                                                                                                                       History obtained from the patient  General ROS: negative for - chills, fatigue, fever, night sweats, weight gain or weight loss Psychological ROS: negative for - behavioral disorder, hallucinations, memory difficulties, mood swings or suicidal ideation Ophthalmic ROS: negative for - blurry vision, double vision, eye pain or loss of vision ENT ROS: negative for - epistaxis, nasal discharge, oral lesions, sore throat, tinnitus or vertigo Allergy and Immunology ROS: negative for - hives or itchy/watery eyes Hematological and Lymphatic ROS: negative for - bleeding problems, bruising or swollen lymph nodes Endocrine ROS: negative for - galactorrhea, hair pattern changes, polydipsia/polyuria or temperature intolerance Respiratory ROS: negative for - cough, hemoptysis, shortness of breath or wheezing Cardiovascular ROS: negative for - chest pain, dyspnea on exertion, edema or irregular heartbeat Gastrointestinal ROS: negative for - abdominal pain, diarrhea, hematemesis, nausea/vomiting or stool incontinence Genito-Urinary ROS: negative for - dysuria,  hematuria, incontinence or urinary frequency/urgency Musculoskeletal ROS: negative for - joint swelling or muscular weakness Neurological ROS: as noted in HPI Dermatological ROS: negative for rash and skin lesion changes   Blood pressure 178/80, pulse 57, temperature 98.8 F (37.1 C), temperature source Rectal, resp. rate 19, height 6\' 3"  (1.905 m), weight 97.523 kg (215 lb), SpO2 99.00%.   Neurologic Examination:                                                                                                      Mental Status: Alert, not oriented to place or year, follows commands.  Speech  fluent without evidence of aphasia able to name objects and repeat words.   Able to follow simple commands. Cranial Nerves: II: Discs flat bilaterally; Visual fields grossly normal, pupils equal, round, reactive to light and accommodation III,IV, VI: ptosis not present, extra-ocular motions intact bilaterally V,VII: smile symmetric, facial light touch sensation normal bilaterally VIII: hearing normal bilaterally IX,X: gag reflex present XI: bilateral shoulder shrug XII: midline tongue extension Motor: Right : Upper extremity   5/5    Left:     Upper extremity   4/5  Lower extremity   5/5     Lower extremity   3/5 --patient is neglecting Left LE  Tone and bulk:normal tone throughout; no atrophy noted Sensory: Pinprick and light touch intact throughout, bilaterally--Neglecting his left leg and arm to DSS and when asked to lift or move left arm he will move right arm or right leg.  Deep Tendon Reflexes: 2+ and symmetric throughout Plantars: Right: downgoing   Left: upgoing Cerebellar: normal finger-to-nose,  normal heel-to-shin test CV: pulses palpable throughout    Lab Results  Component Value Date/Time   CHOL  Value: 209        ATP III CLASSIFICATION:  <200     mg/dL   Desirable  161-096  mg/dL   Borderline High  >=045    mg/dL   High       * 40/02/8118  6:25 AM    Results for orders placed  during the hospital encounter of 08/30/12 (from the past 48 hour(s))  TROPONIN I     Status: Abnormal   Collection Time    08/30/12  6:52 AM      Result Value Range   Troponin I 0.58 (*) <0.30 ng/mL   Comment:            Due to the release kinetics of cTnI,     a negative result within the first hours     of the onset of symptoms does not rule out     myocardial infarction with certainty.     If myocardial infarction is still suspected,     repeat the test at appropriate intervals.     CRITICAL RESULT CALLED TO, READ BACK BY AND VERIFIED WITH:     Everlean Cherry RN 08/30/12 0737 COSTELLO B  PROTIME-INR     Status: Abnormal   Collection Time    08/30/12  6:55 AM      Result Value Range   Prothrombin Time 15.9 (*) 11.6 - 15.2 seconds   INR 1.30  0.00 - 1.49  APTT     Status: Abnormal   Collection Time    08/30/12  6:55 AM      Result Value Range   aPTT 39 (*) 24 - 37 seconds   Comment:            IF BASELINE aPTT IS ELEVATED,     SUGGEST PATIENT RISK ASSESSMENT     BE USED TO DETERMINE APPROPRIATE     ANTICOAGULANT THERAPY.  CBC     Status: Abnormal   Collection Time    08/30/12  6:55 AM      Result Value Range   WBC 12.4 (*) 4.0 - 10.5 K/uL   RBC 3.50 (*) 4.22 - 5.81 MIL/uL   Hemoglobin 10.8 (*) 13.0 - 17.0 g/dL   HCT 14.7 (*) 82.9 - 56.2 %   MCV 87.4  78.0 - 100.0 fL   MCH 30.9  26.0 - 34.0 pg  MCHC 35.3  30.0 - 36.0 g/dL   RDW 16.1  09.6 - 04.5 %   Platelets 128 (*) 150 - 400 K/uL  DIFFERENTIAL     Status: Abnormal   Collection Time    08/30/12  6:55 AM      Result Value Range   Neutrophils Relative 87 (*) 43 - 77 %   Neutro Abs 10.8 (*) 1.7 - 7.7 K/uL   Lymphocytes Relative 4 (*) 12 - 46 %   Lymphs Abs 0.5 (*) 0.7 - 4.0 K/uL   Monocytes Relative 9  3 - 12 %   Monocytes Absolute 1.1 (*) 0.1 - 1.0 K/uL   Eosinophils Relative 0  0 - 5 %   Eosinophils Absolute 0.0  0.0 - 0.7 K/uL   Basophils Relative 0  0 - 1 %   Basophils Absolute 0.0  0.0 - 0.1 K/uL  COMPREHENSIVE  METABOLIC PANEL     Status: Abnormal   Collection Time    08/30/12  6:55 AM      Result Value Range   Sodium 133 (*) 135 - 145 mEq/L   Potassium 3.9  3.5 - 5.1 mEq/L   Chloride 101  96 - 112 mEq/L   CO2 21  19 - 32 mEq/L   Glucose, Bld 230 (*) 70 - 99 mg/dL   BUN 28 (*) 6 - 23 mg/dL   Creatinine, Ser 4.09 (*) 0.50 - 1.35 mg/dL   Calcium 9.5  8.4 - 81.1 mg/dL   Total Protein 7.6  6.0 - 8.3 g/dL   Albumin 3.4 (*) 3.5 - 5.2 g/dL   AST 10  0 - 37 U/L   ALT 5  0 - 53 U/L   Alkaline Phosphatase 79  39 - 117 U/L   Total Bilirubin 0.5  0.3 - 1.2 mg/dL   GFR calc non Af Amer 31 (*) >90 mL/min   GFR calc Af Amer 36 (*) >90 mL/min   Comment:            The eGFR has been calculated     using the CKD EPI equation.     This calculation has not been     validated in all clinical     situations.     eGFR's persistently     <90 mL/min signify     possible Chronic Kidney Disease.  POCT I-STAT, CHEM 8     Status: Abnormal   Collection Time    08/30/12  7:02 AM      Result Value Range   Sodium 139  135 - 145 mEq/L   Potassium 4.2  3.5 - 5.1 mEq/L   Chloride 107  96 - 112 mEq/L   BUN 28 (*) 6 - 23 mg/dL   Creatinine, Ser 9.14 (*) 0.50 - 1.35 mg/dL   Glucose, Bld 782 (*) 70 - 99 mg/dL   Calcium, Ion 9.56  2.13 - 1.30 mmol/L   TCO2 22  0 - 100 mmol/L   Hemoglobin 11.2 (*) 13.0 - 17.0 g/dL   HCT 08.6 (*) 57.8 - 46.9 %  POCT I-STAT TROPONIN I     Status: Abnormal   Collection Time    08/30/12  7:09 AM      Result Value Range   Troponin i, poc 0.50 (*) 0.00 - 0.08 ng/mL   Comment NOTIFIED PHYSICIAN     Comment 3            Comment: Due to the release kinetics of cTnI,  a negative result within the first hours     of the onset of symptoms does not rule out     myocardial infarction with certainty.     If myocardial infarction is still suspected,     repeat the test at appropriate intervals.  GLUCOSE, CAPILLARY     Status: Abnormal   Collection Time    08/30/12  7:16 AM      Result  Value Range   Glucose-Capillary 213 (*) 70 - 99 mg/dL  LACTIC ACID, PLASMA     Status: Abnormal   Collection Time    08/30/12  7:20 AM      Result Value Range   Lactic Acid, Venous 2.4 (*) 0.5 - 2.2 mmol/L  PHENYTOIN LEVEL, TOTAL     Status: Abnormal   Collection Time    08/30/12  9:25 AM      Result Value Range   Phenytoin Lvl 3.2 (*) 10.0 - 20.0 ug/mL    Dg Chest 2 View  08/30/2012  *RADIOLOGY REPORT*  Clinical Data: Fever, hypertension, diabetes  CHEST - 2 VIEW  Comparison: 05/13/2009  Findings: Upper-normal size of cardiac silhouette. Mildly tortuous thoracic aorta with atherosclerotic calcification. Pulmonary vascularity normal. Lungs grossly clear. No pleural effusion, pneumothorax, or acute osseous abnormality.  IMPRESSION: No acute abnormalities.   Original Report Authenticated By: Ulyses Southward, M.D.    Ct Head Wo Contrast  08/30/2012  *RADIOLOGY REPORT*  Clinical Data: Left-sided weakness.  History of brain tumor.  CT HEAD WITHOUT CONTRAST  Technique:  Contiguous axial images were obtained from the base of the skull through the vertex without contrast.  Comparison: Head CT from 05/06/2009.  Brain MRI from 05/11/2009.  Findings: There is no evidence for an acute hemorrhage.  No hydrocephalus.  The bifrontal encephalomalacia with ex vacuo dilatation of both frontal horns is stable.  There is evidence of prior frontal craniotomy. There is progression of tumor along the superior cribiform plate, extending into the floor of the frontal skull.  The sphenoid sinuses are opacified.  On this CT scan from 05/06/2009, the right sphenoid sinus was opacified on today's study, both sphenoid sinuses are completely opacified and there is extension of the material in the posterior ethmoid air cells.  MRI from 05/11/2009 indicated that this represented residual tumor which has apparently progressed in the interval.  There is no interval substantial enlargement of the sphenoid sinuses and no gross destruction  of the sphenoid or posterior ethmoid sinuses is evident.  IMPRESSION: Interval progression of tumor in the inferior frontal region with further expansion into the sphenoid sinuses and posterior ethmoid air cells.  Extensive postoperative encephalomalacia in the frontal lobes with ex vacuo dilatation of the frontal horns.  This is not substantially changed in the interval.   Original Report Authenticated By: Kennith Center, M.D.      Assessment/Plan: 79 YO male with known meningioma S/P resection. Patient is followed by [facility] and was last seen one year ago. At that time he was told the meningioma had grown and patient made decision to not go through surgical intervention. Patient is on Dilantin at home but Dilantin level is 3.2 while in ED. Patient was brought to hospital after family noted increased confusion and seizure like activity this AM . Currently patient is confused and showing left arm and leg neglect.  Concern patient may be currently seizing subclinically.    Plan:  1) load with 1000 mg Fosphenytoin STAT with follow up Dilantin level in 2 hours and  adjust dose per EEG.  2) STAT EEG 3) MRI brain W/O contrast.  Assessment and plan discussed with with attending physician and they are in agreement.    Hriday Stai PA-C [facility] [phone]  08/30/2012, 10:58 AM  I have seen and evaluated the patient. I have reviewed the above note and made appropriate changes.   EEG shows he is not in status and MRI negative for stroke. He has a persistent significant weakness of his left leg that is new  and will likely need to be admitted for observation as he is unable to walk. 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   I am not sure the patient is compliant with his dilantin, if he is, would consider change to keppra as this has more reliable pharmacokinetics. If missed doses, then would continue dilantin at home dose.    Ritta Slot, MD [facility] [phone]  If 7pm- 7am, please page neurology on call at [phone].

## 2012-08-30 NOTE — Progress Notes (Signed)
Stat EEG completed.

## 2012-08-30 NOTE — ED Notes (Signed)
Results of troponin shown to PA-C, Beverly and told to MD, Theodoro Kalata

## 2012-08-30 NOTE — H&P (Signed)
[facility] History and Physical  Kurt [name] AVW:[id number] DOB: [birth year] DOA: 08/30/2012  Referring physician: Campbell Lerner, ED PA PCP: Nadean Corwin, MD  OP Specialists:  1. [facility] Neurosurgery.  Chief Complaint: Seizure like activity  HPI: Kurt [name] is a 79 y.o. male with PMH of meningioma S/P resection, followed by [facility] and during last visit about a year ago was told that the meningioma had grown and was recommended, knife radiation which the patient declined. He also has history of seizures (last seizure 10-12 months ago), type 2 diabetes mellitus, hypertension, hyperlipidemia, chronic kidney disease stage III and possible mild dementia. Unable to obtain history from patient secondary to confusion. History is obtained from patient's daughter Ms. Rhea Pink who is at bedside. He was in usual state of health until this morning. Patient spouse called Ms. Carter at 6 AM today indicating that he was not well. At 6:15 AM, she went to patient's house and found him laying in bed with generalized jerking/seizure-like activity, eyes wide open, urinary incontinence but patient was responding to questions. No tongue biting or frothing at mouth. The episode lasted until EMS got there (approximately 30 minutes if not longer). Patient was brought to the ED where he was found to have left-sided weakness. Stroke code was called. Neurology consulted. EEG does not show status. MRI brain negative for stroke and unchanged size of meningioma. He was loaded with a gram of fosphenytoin by neurology. Currently patient is pleasantly confused and has persisting left leg weakness. Patient also had minimally elevated troponin which was discussed with [facility] cardiology who suggested that it was secondary to seizure. Hospitalist service was requested to admit for further evaluation and management. Per daughter, patient is compliant with all his medications.   Review of Systems: All systems  reviewed and apart from history of presenting illness, are negative. Specifically agent has not complained of chest pain. Daughter indicates that left side of his face appears slightly darker than the right but no history of falls or trauma.  Past Medical History  Diagnosis Date  . Hypertension   . Diabetes mellitus without complication   . Hyperlipidemia   . Seizure disorder   . CKD (chronic kidney disease), stage III    Past Surgical History  Procedure Laterality Date  . Brain tumor excision     Social History:  reports that he has never smoked. He does not have any smokeless tobacco history on file. He reports that he does not drink alcohol or use illicit drugs. Married. Spouse has dementia. Independent of activities of daily living.  No Known Allergies  Family History  Problem Relation Age of Onset  . Hypertension Daughter    Prior to Admission medications   Medication Sig Start Date End Date Taking? Authorizing Provider  aspirin EC 81 MG tablet Take 81 mg by mouth daily.   Yes Historical Provider, MD  atenolol (TENORMIN) 100 MG tablet Take 100 mg by mouth daily.   Yes Historical Provider, MD  Cholecalciferol (D-3-5) 5000 UNITS capsule Take 5,000 Units by mouth daily.   Yes Historical Provider, MD  finasteride (PROSCAR) 5 MG tablet Take 5 mg by mouth daily.   Yes Historical Provider, MD  losartan-hydrochlorothiazide (HYZAAR) 100-12.5 MG per tablet Take 1 tablet by mouth daily.   Yes Historical Provider, MD  metFORMIN (GLUMETZA) 500 MG (MOD) 24 hr tablet Take 1,000 mg by mouth 2 (two) times daily with a meal.   Yes Historical Provider, MD  phenytoin (DILANTIN) 100 MG  ER capsule Take 200 mg by mouth 3 (three) times daily.   Yes Historical Provider, MD  simvastatin (ZOCOR) 40 MG tablet Take 40 mg by mouth every evening.   Yes Historical Provider, MD   Physical Exam: Filed Vitals:   08/30/12 1045 08/30/12 1100 08/30/12 1415 08/30/12 1445  BP: 193/73 202/74 142/69 186/74  Pulse:  62 58 57 57  Temp:      TempSrc:      Resp: 18 15    Height:      Weight:      SpO2: 100% 100% 99% 99%     General exam: Moderately built and nourished male patient, lying comfortably supine on the gurney in no obvious distress.  Head, eyes and ENT: Nontraumatic and normocephalic. Pupils equally reacting to light and accommodation. Oral mucosa slightly dry.  Neck: Supple. No JVD, carotid bruit or thyromegaly.  Lymphatics: No lymphadenopathy.  Respiratory system: Clear to auscultation. No increased work of breathing.  Cardiovascular system: S1 and S2 heard, RRR. No JVD, murmurs, gallops, clicks or pedal edema. Telemetry shows sinus rhythm.  Gastrointestinal system: Abdomen is nondistended, soft and nontender. Normal bowel sounds heard. No organomegaly or masses appreciated.  Central nervous system: Alert and oriented only to self. No cranial deficits.  Extremities: Symmetric 5 x 5 power upper extremities. Patient not following commands to test power in lower extremities. Peripheral pulses symmetrically felt.  Skin: No rashes or acute findings. Hyperpigmentation of left side of temple/face >right side without acute findings.  Musculoskeletal system: Negative exam.  Psychiatry: Pleasantly confused   Labs on Admission:  Basic Metabolic Panel:  Recent Labs Lab 08/30/12 0655 08/30/12 0702  NA 133* 139  K 3.9 4.2  CL 101 107  CO2 21  --   GLUCOSE 230* 231*  BUN 28* 28*  CREATININE 1.91* 1.90*  CALCIUM 9.5  --    Liver Function Tests:  Recent Labs Lab 08/30/12 0655  AST 10  ALT 5  ALKPHOS 79  BILITOT 0.5  PROT 7.6  ALBUMIN 3.4*   No results found for this basename: LIPASE, AMYLASE,  in the last 168 hours No results found for this basename: AMMONIA,  in the last 168 hours CBC:  Recent Labs Lab 08/30/12 0655 08/30/12 0702  WBC 12.4*  --   NEUTROABS 10.8*  --   HGB 10.8* 11.2*  HCT 30.6* 33.0*  MCV 87.4  --   PLT 128*  --    Cardiac  Enzymes:  Recent Labs Lab 08/30/12 0652  TROPONINI 0.58*    BNP (last 3 results) No results found for this basename: PROBNP,  in the last 8760 hours CBG:  Recent Labs Lab 08/30/12 0716  GLUCAP 213*    Radiological Exams on Admission: Dg Chest 2 View  08/30/2012  *RADIOLOGY REPORT*  Clinical Data: Fever, hypertension, diabetes  CHEST - 2 VIEW  Comparison: 05/13/2009  Findings: Upper-normal size of cardiac silhouette. Mildly tortuous thoracic aorta with atherosclerotic calcification. Pulmonary vascularity normal. Lungs grossly clear. No pleural effusion, pneumothorax, or acute osseous abnormality.  IMPRESSION: No acute abnormalities.   Original Report Authenticated By: Ulyses Southward, M.D.    Ct Head Wo Contrast  08/30/2012  *RADIOLOGY REPORT*  Clinical Data: Left-sided weakness.  History of brain tumor.  CT HEAD WITHOUT CONTRAST  Technique:  Contiguous axial images were obtained from the base of the skull through the vertex without contrast.  Comparison: Head CT from 05/06/2009.  Brain MRI from 05/11/2009.  Findings: There is no evidence for an  acute hemorrhage.  No hydrocephalus.  The bifrontal encephalomalacia with ex vacuo dilatation of both frontal horns is stable.  There is evidence of prior frontal craniotomy. There is progression of tumor along the superior cribiform plate, extending into the floor of the frontal skull.  The sphenoid sinuses are opacified.  On this CT scan from 05/06/2009, the right sphenoid sinus was opacified on today's study, both sphenoid sinuses are completely opacified and there is extension of the material in the posterior ethmoid air cells.  MRI from 05/11/2009 indicated that this represented residual tumor which has apparently progressed in the interval.  There is no interval substantial enlargement of the sphenoid sinuses and no gross destruction of the sphenoid or posterior ethmoid sinuses is evident.  IMPRESSION: Interval progression of tumor in the inferior  frontal region with further expansion into the sphenoid sinuses and posterior ethmoid air cells.  Extensive postoperative encephalomalacia in the frontal lobes with ex vacuo dilatation of the frontal horns.  This is not substantially changed in the interval.   Original Report Authenticated By: Kennith Center, M.D.    Mr Brain Wo Contrast  08/30/2012  *RADIOLOGY REPORT*  Clinical Data: 79 year old male with right side weakness.  Previous frontal craniotomy surgery for meningioma Renal failure precludes IV contrast today.  MRI HEAD WITHOUT CONTRAST  Technique:  Multiplanar, multiecho pulse sequences of the brain and surrounding structures were obtained according to standard protocol without intravenous contrast.  Comparison: Brain MRI without and with contrast 05/11/2009.  Head CT 08/30/2012, 05/06/2009.  Findings: Absence of IV contrast limits delineation of the residual skull base tumor.  Based on the 2010 exam, chronic meningioma at the planum sphenoidale owl and involving the posterior ethmoid and superior sphenoid sinuses is estimated to measure up to 43 x 51 x 37 mm (AP by transverse by CC).  Previously, enhancing component of the tumor at a similar level measured 43 x 53 x 36 mm.  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  As before, there are obstructed secretions in the sphenoid sinuses, and these have increased.  There appears to be more frank tumor extension into the right sphenoid, while the secretions on the left are felt to be inspissated.  Underlying sphenoid sinus hyperplasia again noted, they occupy most of the clivus.  Mildly restricted diffusion in the chronic tumor, especially at the right greater wing of the sphenoid. No restricted diffusion to suggest acute infarction.  Chronic by frontal encephalomalacia has progressed.  Ex vacuo enlargement of the ventricles has not significantly changed.  No midline shift. No acute intracranial hemorrhage identified.  Major  intracranial vascular flow voids are preserved.     Stable gray and white matter signal elsewhere. Negative cervicomedullary junction and visualized cervical spine.  Bone marrow signal outside the area of tumor infiltration is normal.  Orbit soft tissues within normal limits.  Maxillary sinuses and mastoids remain clear.  No acute scalp soft tissue abnormality.  IMPRESSION: 1.  No evidence of acute infarct. 2.  Chronic bulky skull base meningioma, with interval increased obstructed secretions in the sphenoid sinuses.  Gross tumor dimensions are not significantly changed since 2010 (tumor size today is estimated due to the lack of IV contrast). No associated cerebral edema. 3.  Progression of chronic bifrontal encephalomalacia which is at least in part postoperative. 4.  No new signal abnormality identified in the brain.   Original Report Authenticated By: Erskine Speed, M.D.     EKG: Independently reviewed.  Normal sinus rhythm without changes.  Assessment/Plan Principal Problem:   Seizure disorder, with left leg weakness (Todds phenomenon) Active Problems:   Hypertension   Diabetes mellitus without complication   Hyperlipidemia   CKD (chronic kidney disease), stage III   1. Seizure disorder: With breakthrough seizure followed by postictal left leg weakness (Todd's phenomenon) and confusion. Although family claims compliance to medications, serum Dilantin level was low. Patient has been loaded with IV fosphenytoin. Neurology has consulted. Discussed with Dr. Amada Jupiter who recommends discontinuing Dilantin and switching to Keppra 500 mg by mouth twice a day without loading dose, because of more reliable pharmacokinetics. Admit to neuro telemetry. Seizure precautions. PT and OT consultation. 2. Uncontrolled type II DM: DC metformin indefinitely. Low dose Levemir and SSI. May need to consider alternate oral agent on discharge. 3. Uncontrolled hypertension: Continue atenolol and ARB. 4. Stage III  chronic kidney disease: Creatinine has slightly increased compared to 2010 which may be progression of disease. Renal ultrasound in 2010 had not shown hydronephrosis. Followup BMP in a.m. 5. Anemia: Possibly secondary to chronic kidney disease. Follow CBC in a.m. 6. Thrombocytopenia: Patient has had periodic mild low platelets in the past. Follow CBC in a.m. 7. Mildly elevated troponin: No history of chest pain and EKG without acute findings. Possibly secondary to chronic kidney disease and seizure. Trend troponins. Check CK. We'll also check 2-D echocardiogram. Continue home aspirin and beta blockers. 8. Meningioma, status post resection: Said to be worsening but patient has declined further intervention. MRI brain suggests no significant change compared to 2010.     Code Status: DO NOT RESUSCITATE-per discussion with daughter.  Family Communication: Discussed with patient's daughter Ms. Rhea Pink and spouse at bedside.  Disposition Plan: To be determined   Time spent: 70 minutes  [facility] Pager [phone]  If 7PM-7AM, please contact night-coverage [email or web address] Password [facility] 08/30/2012, 3:43 PM

## 2012-08-30 NOTE — Progress Notes (Signed)
Dr Marcellus Scott [facility] Paged at [phone] Pending return call

## 2012-08-30 NOTE — ED Notes (Signed)
Spoke with case manager Marval Regal)  at 16:50 she said normally the unit care manager takes care of this issue  about getting Rx filled since the pt. Is getting admitted, however she will contact Dr. Lavena Bullion to give confirmation.

## 2012-08-30 NOTE — ED Notes (Signed)
Admitting MD in to assess pt for admission and talk to family.

## 2012-08-31 ENCOUNTER — Encounter (HOSPITAL_COMMUNITY): Payer: Self-pay | Admitting: General Practice

## 2012-08-31 DIAGNOSIS — E782 Mixed hyperlipidemia: Secondary | ICD-10-CM

## 2012-08-31 DIAGNOSIS — R4182 Altered mental status, unspecified: Secondary | ICD-10-CM

## 2012-08-31 DIAGNOSIS — I10 Essential (primary) hypertension: Secondary | ICD-10-CM

## 2012-08-31 DIAGNOSIS — E119 Type 2 diabetes mellitus without complications: Secondary | ICD-10-CM

## 2012-08-31 HISTORY — DX: Altered mental status, unspecified: R41.82

## 2012-08-31 LAB — BASIC METABOLIC PANEL
BUN: 29 mg/dL — ABNORMAL HIGH (ref 6–23)
CO2: 23 mEq/L (ref 19–32)
Calcium: 9.7 mg/dL (ref 8.4–10.5)
Creatinine, Ser: 1.85 mg/dL — ABNORMAL HIGH (ref 0.50–1.35)
GFR calc non Af Amer: 33 mL/min — ABNORMAL LOW (ref >90)
Glucose, Bld: 108 mg/dL — ABNORMAL HIGH (ref 70–99)
Sodium: 137 mEq/L (ref 135–145)

## 2012-08-31 LAB — CBC
MCH: 31 pg (ref 26.0–34.0)
MCHC: 34.6 g/dL (ref 30.0–36.0)
MCV: 89.6 fL (ref 78.0–100.0)
Platelets: 140 10*3/uL — ABNORMAL LOW (ref 150–400)
RBC: 3.55 MIL/uL — ABNORMAL LOW (ref 4.22–5.81)

## 2012-08-31 LAB — URINALYSIS, ROUTINE W REFLEX MICROSCOPIC
Nitrite: NEGATIVE
Specific Gravity, Urine: 1.019 (ref 1.005–1.030)
Urobilinogen, UA: 0.2 mg/dL (ref 0.0–1.0)

## 2012-08-31 LAB — TSH: TSH: 1.318 u[IU]/mL (ref 0.350–4.500)

## 2012-08-31 LAB — GLUCOSE, CAPILLARY
Glucose-Capillary: 107 mg/dL — ABNORMAL HIGH (ref 70–99)
Glucose-Capillary: 92 mg/dL (ref 70–99)

## 2012-08-31 LAB — URINE MICROSCOPIC-ADD ON

## 2012-08-31 MED ORDER — ATENOLOL 100 MG PO TABS: 100.0000 mg | ORAL_TABLET | Freq: Every day | ORAL | Status: DC

## 2012-08-31 MED ORDER — ATENOLOL 100 MG PO TABS: 50.0000 mg | ORAL_TABLET | Freq: Every day | ORAL | Status: DC

## 2012-08-31 MED ORDER — ATENOLOL 50 MG PO TABS
50.0000 mg | ORAL_TABLET | Freq: Every day | ORAL | Status: DC
  Administered 2012-08-31 – 2012-09-02 (×3): 50 mg via ORAL
  Filled 2012-08-31 (×3): qty 1

## 2012-08-31 MED ORDER — LEVETIRACETAM 500 MG PO TABS: 500.0000 mg | ORAL_TABLET | Freq: Two times a day (BID) | ORAL | Status: DC

## 2012-08-31 MED ORDER — LOSARTAN POTASSIUM 50 MG PO TABS
100.0000 mg | ORAL_TABLET | Freq: Every day | ORAL | Status: DC
  Administered 2012-08-31 – 2012-09-02 (×3): 100 mg via ORAL
  Filled 2012-08-31 (×3): qty 2

## 2012-08-31 MED ORDER — LOSARTAN POTASSIUM-HCTZ 100-12.5 MG PO TABS
1.0000 | ORAL_TABLET | Freq: Every day | ORAL | Status: DC
Start: 2012-08-31 — End: 2012-08-31

## 2012-08-31 MED ORDER — HYDROCHLOROTHIAZIDE 12.5 MG PO CAPS
12.5000 mg | ORAL_CAPSULE | Freq: Every day | ORAL | Status: DC
  Administered 2012-08-31 – 2012-09-02 (×3): 12.5 mg via ORAL
  Filled 2012-08-31 (×3): qty 1

## 2012-08-31 NOTE — Progress Notes (Signed)
Pt 's HR on the tele monitor was on the 40's-50's and troponin level now is 0.47. Pt is asymptomatic. MD on call was notified. Will continue to monitor.

## 2012-08-31 NOTE — Progress Notes (Addendum)
[facility] PROGRESS NOTE Assessment/Plan:   *Seizure disorder, with left leg weakness (Todds phenomenon) - Keppra, EEG b/l foci of epileptogenicity. - Left weakness resolved - MRI 3.24.2014: No evidence of acute infarct. - Pt consult pending, home after evaluation.  Hypertension: - resume home meds.  Diabetes mellitus without complication: - continues to improved. - his HBg 5.8. No episodes of hypoglycemia.  Hyperlipidemia: - statins.  CKD (chronic kidney disease), stage III: - at baseline ( 1.7-1.8)   Elevated Topronins: - 2/2 to renal disease and probable seizure ( rhabdomyolisis)check CK.  Code Status: DO NOT RESUSCITATE-per discussion with daughter.  Family Communication: Discussed with patient's daughter Ms. Rhea Pink and spouse at bedside.  Disposition Plan: To be determined    Consultants:  neurology  Procedures: EEG 3.24.2014: EEG is consistent with bilateral independent foci of potential epileptogenicity the right frontotemporal and left frontal regions. There is also evidence of right greater than left posterior dysfunction which could be consistent with his postictal state. There is no sign of ongoing seizure activity    Antibiotics:  none (indicate start date, and stop date if known)  HPI/Subjective: No complains.   Objective: Filed Vitals:   08/30/12 1630 08/30/12 1739 08/30/12 2200 08/31/12 0600  BP: 180/109 183/71 148/71 171/90  Pulse: 58 62 52 50  Temp:  98.9 F (37.2 C) 97.9 F (36.6 C) 98 F (36.7 C)  TempSrc:  Oral Oral Oral  Resp:  18 18 18   Height:      Weight:      SpO2: 99% 100% 99% 100%    Intake/Output Summary (Last 24 hours) at 08/31/12 6962 Last data filed at 08/30/12 2200  Gross per 24 hour  Intake    423 ml  Output      0 ml  Net    423 ml   Filed Weights   08/30/12 0732  Weight: 97.523 kg (215 lb)    Exam:  General: Alert, awake, oriented x3, in no acute distress.  HEENT: No bruits, no goiter.   Heart: Regular rate and rhythm, without murmurs, rubs, gallops.  Lungs: Good air movement, bilateral air movement.  Abdomen: Soft, nontender, nondistended, positive bowel sounds.  Neuro: Left lower extremity weakness.   Data Reviewed: Basic Metabolic Panel:  Recent Labs Lab 08/30/12 0655 08/30/12 0702 08/31/12 0420  NA 133* 139 137  K 3.9 4.2 3.8  CL 101 107 104  CO2 21  --  23  GLUCOSE 230* 231* 108*  BUN 28* 28* 29*  CREATININE 1.91* 1.90* 1.85*  CALCIUM 9.5  --  9.7   Liver Function Tests:  Recent Labs Lab 08/30/12 0655  AST 10  ALT 5  ALKPHOS 79  BILITOT 0.5  PROT 7.6  ALBUMIN 3.4*   No results found for this basename: LIPASE, AMYLASE,  in the last 168 hours No results found for this basename: AMMONIA,  in the last 168 hours CBC:  Recent Labs Lab 08/30/12 0655 08/30/12 0702 08/31/12 0420  WBC 12.4*  --  12.6*  NEUTROABS 10.8*  --   --   HGB 10.8* 11.2* 11.0*  HCT 30.6* 33.0* 31.8*  MCV 87.4  --  89.6  PLT 128*  --  140*   Cardiac Enzymes:  Recent Labs Lab 08/30/12 0652 08/30/12 1823 08/30/12 2307 08/31/12 0425  TROPONINI 0.58* 0.39* 0.44* 0.47*   BNP (last 3 results) No results found for this basename: PROBNP,  in the last 8760 hours CBG:  Recent Labs Lab 08/30/12 0716 08/30/12 1803  08/30/12 2124 08/31/12 0626  GLUCAP 213* 162* 159* 107*    No results found for this or any previous visit (from the past 240 hour(s)).   Studies: Dg Chest 2 View  08/30/2012  *RADIOLOGY REPORT*  Clinical Data: Fever, hypertension, diabetes  CHEST - 2 VIEW  Comparison: 05/13/2009  Findings: Upper-normal size of cardiac silhouette. Mildly tortuous thoracic aorta with atherosclerotic calcification. Pulmonary vascularity normal. Lungs grossly clear. No pleural effusion, pneumothorax, or acute osseous abnormality.  IMPRESSION: No acute abnormalities.   Original Report Authenticated By: Ulyses Southward, M.D.    Ct Head Wo Contrast  08/30/2012  *RADIOLOGY REPORT*   Clinical Data: Left-sided weakness.  History of brain tumor.  CT HEAD WITHOUT CONTRAST  Technique:  Contiguous axial images were obtained from the base of the skull through the vertex without contrast.  Comparison: Head CT from 05/06/2009.  Brain MRI from 05/11/2009.  Findings: There is no evidence for an acute hemorrhage.  No hydrocephalus.  The bifrontal encephalomalacia with ex vacuo dilatation of both frontal horns is stable.  There is evidence of prior frontal craniotomy. There is progression of tumor along the superior cribiform plate, extending into the floor of the frontal skull.  The sphenoid sinuses are opacified.  On this CT scan from 05/06/2009, the right sphenoid sinus was opacified on today's study, both sphenoid sinuses are completely opacified and there is extension of the material in the posterior ethmoid air cells.  MRI from 05/11/2009 indicated that this represented residual tumor which has apparently progressed in the interval.  There is no interval substantial enlargement of the sphenoid sinuses and no gross destruction of the sphenoid or posterior ethmoid sinuses is evident.  IMPRESSION: Interval progression of tumor in the inferior frontal region with further expansion into the sphenoid sinuses and posterior ethmoid air cells.  Extensive postoperative encephalomalacia in the frontal lobes with ex vacuo dilatation of the frontal horns.  This is not substantially changed in the interval.   Original Report Authenticated By: Kennith Center, M.D.    Mr Brain Wo Contrast  08/30/2012  *RADIOLOGY REPORT*  Clinical Data: 79 year old male with right side weakness.  Previous frontal craniotomy surgery for meningioma Renal failure precludes IV contrast today.  MRI HEAD WITHOUT CONTRAST  Technique:  Multiplanar, multiecho pulse sequences of the brain and surrounding structures were obtained according to standard protocol without intravenous contrast.  Comparison: Brain MRI without and with contrast  05/11/2009.  Head CT 08/30/2012, 05/06/2009.  Findings: Absence of IV contrast limits delineation of the residual skull base tumor.  Based on the 2010 exam, chronic meningioma at the planum sphenoidale owl and involving the posterior ethmoid and superior sphenoid sinuses is estimated to measure up to 43 x 51 x 37 mm (AP by transverse by CC).  Previously, enhancing component of the tumor at a similar level measured 43 x 53 x 36 mm.  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  As before, there are obstructed secretions in the sphenoid sinuses, and these have increased.  There appears to be more frank tumor extension into the right sphenoid, while the secretions on the left are felt to be inspissated.  Underlying sphenoid sinus hyperplasia again noted, they occupy most of the clivus.  Mildly restricted diffusion in the chronic tumor, especially at the right greater wing of the sphenoid. No restricted diffusion to suggest acute infarction.  Chronic by frontal encephalomalacia has progressed.  Ex vacuo enlargement of the ventricles has not significantly changed.  No midline shift. No acute intracranial hemorrhage identified.  Major intracranial vascular flow voids are preserved.     Stable gray and white matter signal elsewhere. Negative cervicomedullary junction and visualized cervical spine.  Bone marrow signal outside the area of tumor infiltration is normal.  Orbit soft tissues within normal limits.  Maxillary sinuses and mastoids remain clear.  No acute scalp soft tissue abnormality.  IMPRESSION: 1.  No evidence of acute infarct. 2.  Chronic bulky skull base meningioma, with interval increased obstructed secretions in the sphenoid sinuses.  Gross tumor dimensions are not significantly changed since 2010 (tumor size today is estimated due to the lack of IV contrast). No associated cerebral edema. 3.  Progression of chronic bifrontal encephalomalacia which is at least in part  postoperative. 4.  No new signal abnormality identified in the brain.   Original Report Authenticated By: Erskine Speed, M.D.     Scheduled Meds: . aspirin EC  81 mg Oral Daily  . atenolol  100 mg Oral Daily  . cholecalciferol  5,000 Units Oral Daily  . enoxaparin (LOVENOX) injection  40 mg Subcutaneous Q24H  . finasteride  5 mg Oral Daily  . hydrochlorothiazide  12.5 mg Oral Daily  . insulin aspart  0-5 Units Subcutaneous QHS  . insulin aspart  0-9 Units Subcutaneous TID WC  . insulin detemir  5 Units Subcutaneous QHS  . levETIRAcetam  500 mg Oral BID  . losartan  100 mg Oral Daily  . simvastatin  40 mg Oral QHS  . sodium chloride  3 mL Intravenous Q12H   Continuous Infusions:    Marinda Elk  [facility] Pager [phone]. If 8PM-8AM, please contact night-coverage at [email or web address], password [facility] 08/31/2012, 8:22 AM  LOS: 1 day

## 2012-08-31 NOTE — Evaluation (Signed)
Physical Therapy Evaluation Patient Details Name: Kurt [name] MRN: [id number] DOB: [birth year] Today's Date: 08/31/2012 Time: 4098-1191 PT Time Calculation (min): 28 min  PT Assessment / Plan / Recommendation Clinical Impression  Pt is an 79 yo male admitted for seizures and L sided weakness. Per spouse report pt was ambulating with straight cane occasionally but both were more or less home bound as they didnt venture out of the home much. Patient now requires 1-2 person assist for safe transfers and was unable to ambulate this date. Spoke with spouse and she is unable to manage patient by herself. Pt to greatly benefit from ST-SNF placement to improve mentioned deficits for safe transition back home.    PT Assessment  Patient needs continued PT services    Follow Up Recommendations  SNF;Supervision/Assistance - 24 hour    Does the patient have the potential to tolerate intense rehabilitation      Barriers to Discharge  (wife unable to physically assist patient)      Equipment Recommendations  Rolling walker with 5" wheels    Recommendations for Other Services     Frequency Min 3X/week    Precautions / Restrictions Precautions Precautions: Fall Precaution Comments: possible delirium Restrictions Weight Bearing Restrictions: No   Pertinent Vitals/Pain Pt denies pain      Mobility  Bed Mobility Bed Mobility: Supine to Sit Supine to Sit: 3: Mod assist;With rails;HOB elevated Details for Bed Mobility Assistance: assist for trunk elevation and LE management, pt with extremely slow processing and difficulty motor planning/sequencing. pt with strong posterior lean with inability to self-correct Transfers Transfers: Sit to Stand;Stand to Sit;Stand Pivot Transfers Sit to Stand: 1: +1 Total assist;With upper extremity assist;From bed Stand to Sit: 3: Mod assist;With upper extremity assist;To chair/3-in-1;To bed;With armrests Stand Pivot Transfers: 1: +1 Total assist Details  for Transfer Assistance: max directional v/c's for task. blocked L foot to prevent from sliding forward. max directional v/c's for sequencing and hand placement. maxA required to achieve full upright standing. once in standing maxA provided to maintain balance and mid line. Provided RW however pt with R lateral lean. Ambulation/Gait Ambulation/Gait Assistance: Not tested (comment) Ambulation/Gait Assistance Details: pt took 3 steps to chair with modA to advance L LE and max directional v/c's. maxA required to maintain patient upright General Gait Details: pt with minimal foot clearance during taking steps to chair Wheelchair Mobility Wheelchair Mobility: No    Exercises     PT Diagnosis: Difficulty walking;Generalized weakness  PT Problem List: Decreased strength;Decreased activity tolerance;Decreased balance;Decreased mobility PT Treatment Interventions: DME instruction;Gait training;Stair training;Functional mobility training;Therapeutic activities;Therapeutic exercise   PT Goals Acute Rehab PT Goals PT Goal Formulation: With patient Time For Goal Achievement: 09/14/12 Potential to Achieve Goals: Good Pt will go Supine/Side to Sit: with min assist;with HOB 0 degrees PT Goal: Supine/Side to Sit - Progress: Goal set today Pt will go Sit to Supine/Side: with min assist;with HOB 0 degrees PT Goal: Sit to Supine/Side - Progress: Goal set today Pt will go Sit to Stand: with min assist;with upper extremity assist (up to RW) PT Goal: Sit to Stand - Progress: Goal set today Pt will Transfer Bed to Chair/Chair to Bed: with min assist (with RW) PT Transfer Goal: Bed to Chair/Chair to Bed - Progress: Goal set today Pt will Ambulate: 16 - 50 feet;with min assist;with rolling walker PT Goal: Ambulate - Progress: Goal set today Pt will Go Up / Down Stairs: with min assist;3-5 stairs;with rail(s) PT Goal: Up/Down Stairs -  Progress: Goal set today  Visit Information  Last PT Received On:  08/31/12 Assistance Needed: +2    Subjective Data  Subjective: Pt received supine in bed pleasant and agreeable to PT.   Prior Functioning  Home Living Lives With: Spouse Available Help at Discharge: Family;Available 24 hours/day (however spouse unable to physically assist) Type of Home: House Home Access: Stairs to enter [employer] of Steps: 2-4 Entrance Stairs-Rails: None (2 steps in back, 2 hand rails on 4 steps in front) Home Layout: One level Bathroom Shower/Tub: Engineer, manufacturing systems: Standard Home Adaptive Equipment: Straight cane Prior Function Level of Independence: Independent Able to Take Stairs?: Yes Driving: No Vocation: Retired Musician: Other (comment) (delayed response time) Dominant Hand: Right    Cognition  Cognition Overall Cognitive Status: Impaired Area of Impairment: Attention;Following commands;Safety/judgement;Awareness of deficits Arousal/Alertness: Awake/alert Orientation Level: Disoriented to;Time;Situation;Place Behavior During Session: [facility] for tasks performed Current Attention Level: Sustained Attention - Other Comments: pt with tangental speech, extremely difficult to get patient to focus on task at hand. was perseverating on previous work and physical therapy sessions Following Commands: Follows one step commands inconsistently Safety/Judgement: Decreased safety judgement for tasks assessed;Decreased awareness of safety precautions Awareness of Deficits: PT pointed out that patient appears weaker than PTA - patient intially denied but then agreed with PT at end of session with report "it feels like something is weighing me down in the back."    Extremity/Trunk Assessment Right Upper Extremity Assessment RUE ROM/Strength/Tone: Deficits (generalized weakness of 4-/5) Left Upper Extremity Assessment LUE ROM/Strength/Tone: Deficits LUE ROM/Strength/Tone Deficits: generalized weakness of 4-/5 Right Lower  Extremity Assessment RLE ROM/Strength/Tone: Deficits RLE ROM/Strength/Tone Deficits: MMT reveals grossly 4+/5 however pt has extremely hard time ambulating Left Lower Extremity Assessment LLE ROM/Strength/Tone: Deficits LLE ROM/Strength/Tone Deficits: grossly 3-/5 Trunk Assessment Trunk Assessment: Normal   Balance    End of Session PT - End of Session Equipment Utilized During Treatment: Gait belt Activity Tolerance: Patient tolerated treatment well Patient left: in chair;with call bell/phone within reach Nurse Communication: Mobility status (+2 assist back to bed via stand pvt)  GP     Loren Vicens Marie 08/31/2012, 10:33 AM  Lewis Shock, PT, DPT Pager #: [phone] Office #: [phone]

## 2012-08-31 NOTE — ED Provider Notes (Signed)
Medical screening examination/treatment/procedure(s) were conducted as a shared visit with non-physician practitioner(s) and myself.  I personally evaluated the patient during the encounter. BIB EMS Sz code stroke called in route for L sided weakness, had seizures activity in route broke with versed.  On arrival tot he ED, code stroke canceled and CT and labs obtained.   On exam post ictal, follows limited commands. No report of ACS symptoms, ECG no STEMI. Plan MED admit   Results for orders placed during the hospital encounter of 08/30/12  PROTIME-INR      Result Value Range   Prothrombin Time 15.9 (*) 11.6 - 15.2 seconds   INR 1.30  0.00 - 1.49  APTT      Result Value Range   aPTT 39 (*) 24 - 37 seconds  CBC      Result Value Range   WBC 12.4 (*) 4.0 - 10.5 K/uL   RBC 3.50 (*) 4.22 - 5.81 MIL/uL   Hemoglobin 10.8 (*) 13.0 - 17.0 g/dL   HCT 16.1 (*) 09.6 - 04.5 %   MCV 87.4  78.0 - 100.0 fL   MCH 30.9  26.0 - 34.0 pg   MCHC 35.3  30.0 - 36.0 g/dL   RDW 40.9  81.1 - 91.4 %   Platelets 128 (*) 150 - 400 K/uL  DIFFERENTIAL      Result Value Range   Neutrophils Relative 87 (*) 43 - 77 %   Neutro Abs 10.8 (*) 1.7 - 7.7 K/uL   Lymphocytes Relative 4 (*) 12 - 46 %   Lymphs Abs 0.5 (*) 0.7 - 4.0 K/uL   Monocytes Relative 9  3 - 12 %   Monocytes Absolute 1.1 (*) 0.1 - 1.0 K/uL   Eosinophils Relative 0  0 - 5 %   Eosinophils Absolute 0.0  0.0 - 0.7 K/uL   Basophils Relative 0  0 - 1 %   Basophils Absolute 0.0  0.0 - 0.1 K/uL  COMPREHENSIVE METABOLIC PANEL      Result Value Range   Sodium 133 (*) 135 - 145 mEq/L   Potassium 3.9  3.5 - 5.1 mEq/L   Chloride 101  96 - 112 mEq/L   CO2 21  19 - 32 mEq/L   Glucose, Bld 230 (*) 70 - 99 mg/dL   BUN 28 (*) 6 - 23 mg/dL   Creatinine, Ser 7.82 (*) 0.50 - 1.35 mg/dL   Calcium 9.5  8.4 - 95.6 mg/dL   Total Protein 7.6  6.0 - 8.3 g/dL   Albumin 3.4 (*) 3.5 - 5.2 g/dL   AST 10  0 - 37 U/L   ALT 5  0 - 53 U/L   Alkaline Phosphatase 79  39 - 117  U/L   Total Bilirubin 0.5  0.3 - 1.2 mg/dL   GFR calc non Af Amer 31 (*) >90 mL/min   GFR calc Af Amer 36 (*) >90 mL/min  TROPONIN I      Result Value Range   Troponin I 0.58 (*) <0.30 ng/mL  LACTIC ACID, PLASMA      Result Value Range   Lactic Acid, Venous 2.4 (*) 0.5 - 2.2 mmol/L  GLUCOSE, CAPILLARY      Result Value Range   Glucose-Capillary 213 (*) 70 - 99 mg/dL  PHENYTOIN LEVEL, TOTAL      Result Value Range   Phenytoin Lvl 3.2 (*) 10.0 - 20.0 ug/mL  PHENYTOIN LEVEL, TOTAL      Result Value Range  Phenytoin Lvl 38.0 (*) 10.0 - 20.0 ug/mL  TROPONIN I      Result Value Range   Troponin I 0.39 (*) <0.30 ng/mL  TROPONIN I      Result Value Range   Troponin I 0.44 (*) <0.30 ng/mL  GLUCOSE, CAPILLARY      Result Value Range   Glucose-Capillary 162 (*) 70 - 99 mg/dL   Comment 1 Documented in Chart    POCT I-STAT, CHEM 8      Result Value Range   Sodium 139  135 - 145 mEq/L   Potassium 4.2  3.5 - 5.1 mEq/L   Chloride 107  96 - 112 mEq/L   BUN 28 (*) 6 - 23 mg/dL   Creatinine, Ser 0.45 (*) 0.50 - 1.35 mg/dL   Glucose, Bld 409 (*) 70 - 99 mg/dL   Calcium, Ion 8.11  9.14 - 1.30 mmol/L   TCO2 22  0 - 100 mmol/L   Hemoglobin 11.2 (*) 13.0 - 17.0 g/dL   HCT 78.2 (*) 95.6 - 21.3 %  POCT I-STAT TROPONIN I      Result Value Range   Troponin i, poc 0.50 (*) 0.00 - 0.08 ng/mL   Comment NOTIFIED PHYSICIAN     Comment 3            Dg Chest 2 View  08/30/2012  *RADIOLOGY REPORT*  Clinical Data: Fever, hypertension, diabetes  CHEST - 2 VIEW  Comparison: 05/13/2009  Findings: Upper-normal size of cardiac silhouette. Mildly tortuous thoracic aorta with atherosclerotic calcification. Pulmonary vascularity normal. Lungs grossly clear. No pleural effusion, pneumothorax, or acute osseous abnormality.  IMPRESSION: No acute abnormalities.   Original Report Authenticated By: Ulyses Southward, M.D.    Ct Head Wo Contrast  08/30/2012  *RADIOLOGY REPORT*  Clinical Data: Left-sided weakness.  History  of brain tumor.  CT HEAD WITHOUT CONTRAST  Technique:  Contiguous axial images were obtained from the base of the skull through the vertex without contrast.  Comparison: Head CT from 05/06/2009.  Brain MRI from 05/11/2009.  Findings: There is no evidence for an acute hemorrhage.  No hydrocephalus.  The bifrontal encephalomalacia with ex vacuo dilatation of both frontal horns is stable.  There is evidence of prior frontal craniotomy. There is progression of tumor along the superior cribiform plate, extending into the floor of the frontal skull.  The sphenoid sinuses are opacified.  On this CT scan from 05/06/2009, the right sphenoid sinus was opacified on today's study, both sphenoid sinuses are completely opacified and there is extension of the material in the posterior ethmoid air cells.  MRI from 05/11/2009 indicated that this represented residual tumor which has apparently progressed in the interval.  There is no interval substantial enlargement of the sphenoid sinuses and no gross destruction of the sphenoid or posterior ethmoid sinuses is evident.  IMPRESSION: Interval progression of tumor in the inferior frontal region with further expansion into the sphenoid sinuses and posterior ethmoid air cells.  Extensive postoperative encephalomalacia in the frontal lobes with ex vacuo dilatation of the frontal horns.  This is not substantially changed in the interval.   Original Report Authenticated By: Kennith Center, M.D.    Mr Brain Wo Contrast  08/30/2012  *RADIOLOGY REPORT*  Clinical Data: 79 year old male with right side weakness.  Previous frontal craniotomy surgery for meningioma Renal failure precludes IV contrast today.  MRI HEAD WITHOUT CONTRAST  Technique:  Multiplanar, multiecho pulse sequences of the brain and surrounding structures were obtained according to standard protocol  without intravenous contrast.  Comparison: Brain MRI without and with contrast 05/11/2009.  Head CT 08/30/2012, 05/06/2009.   Findings: Absence of IV contrast limits delineation of the residual skull base tumor.  Based on the 2010 exam, chronic meningioma at the planum sphenoidale owl and involving the posterior ethmoid and superior sphenoid sinuses is estimated to measure up to 43 x 51 x 37 mm (AP by transverse by CC).  Previously, enhancing component of the tumor at a similar level measured 43 x 53 x 36 mm.  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  As before, there are obstructed secretions in the sphenoid sinuses, and these have increased.  There appears to be more frank tumor extension into the right sphenoid, while the secretions on the left are felt to be inspissated.  Underlying sphenoid sinus hyperplasia again noted, they occupy most of the clivus.  Mildly restricted diffusion in the chronic tumor, especially at the right greater wing of the sphenoid. No restricted diffusion to suggest acute infarction.  Chronic by frontal encephalomalacia has progressed.  Ex vacuo enlargement of the ventricles has not significantly changed.  No midline shift. No acute intracranial hemorrhage identified.  Major intracranial vascular flow voids are preserved.     Stable gray and white matter signal elsewhere. Negative cervicomedullary junction and visualized cervical spine.  Bone marrow signal outside the area of tumor infiltration is normal.  Orbit soft tissues within normal limits.  Maxillary sinuses and mastoids remain clear.  No acute scalp soft tissue abnormality.  IMPRESSION: 1.  No evidence of acute infarct. 2.  Chronic bulky skull base meningioma, with interval increased obstructed secretions in the sphenoid sinuses.  Gross tumor dimensions are not significantly changed since 2010 (tumor size today is estimated due to the lack of IV contrast). No associated cerebral edema. 3.  Progression of chronic bifrontal encephalomalacia which is at least in part postoperative. 4.  No new signal abnormality identified  in the brain.   Original Report Authenticated By: Erskine Speed, M.D.    Marland Kitchen  Sunnie Nielsen, MD 08/31/12 [phone]

## 2012-08-31 NOTE — Progress Notes (Signed)
Subjective: Left leg weakness has resolved. He does have some mild left-sided weakness which is chronic in nature  Exam: Filed Vitals:   08/31/12 0600  BP: 171/90  Pulse: 50  Temp: 98 F (36.7 C)  Resp: 18   Gen: In bed, NAD MS: Awake, alert. Gave the year as 2013. No extinction to suggest neglect CN: Fixates and tracks, face symmetric Motor: He has mild 4+/5 weakness in his left upper, his bilateral lower extremities seemed full strength as does his right upper. Sensory: Intact to light touch.   Impression: 79 year old male with a history of seizures secondary to meningioma. He presented with breakthrough seizure with subsequent left-sided weakness which has now resolved. He had been taking his Dilantin but had a subtherapeutic level.  Given the difficulty of titrating Dilantin, I would favor using a medication with more predictable pharmacokinetics. He has been started on Keppra  Recommendations: 1) Keppra 500 mg twice a day 2) agree with PT down, given chronic left-sided weakness.  Ritta Slot, MD [facility] [phone]  If 7pm- 7am, please page neurology on call at [phone].

## 2012-08-31 NOTE — Clinical Social Work Placement (Addendum)
Clinical Social Work Department CLINICAL SOCIAL WORK PLACEMENT NOTE 08/31/2012  Patient:  Kurt [name], Kurt [name]  Account Number:  [id number] Admit date:  08/30/2012  Clinical Social Worker:  Peggyann Shoals  Date/time:  08/31/2012 02:31 PM  Clinical Social Work is seeking post-discharge placement for this patient at the following level of care:   SKILLED NURSING   (*CSW will update this form in Epic as items are completed)   08/31/2012  Patient/family provided with [facility] Department of Clinical Social Work's list of facilities offering this level of care within the geographic area requested by the patient (or if unable, by the patient's family).  08/31/2012  Patient/family informed of their freedom to choose among providers that offer the needed level of care, that participate in Medicare, Medicaid or managed care program needed by the patient, have an available bed and are willing to accept the patient.  08/31/2012  Patient/family informed of [facility]' ownership interest in [facility], as well as of the fact that they are under no obligation to receive care at this facility.  PASARR submitted to EDS on Existing PASARR # PASARR number received from EDS on   FL2 transmitted to all facilities in geographic area requested by pt/family on  08/31/2012 FL2 transmitted to all facilities within larger geographic area on   Patient informed that his/her managed care company has contracts with or will negotiate with  certain facilities, including the following:     Patient/family informed of bed offers received:  09/01/2012 Patient chooses bed at [facility] Physician recommends and patient chooses bed at  SNF  Patient to be transferred to [facility] on 09/02/2012 Patient to be transferred to facility by [facility]  The following physician request were entered in Epic:   Additional Comments: Blue Medicare auth.  Dede Query, MSW, LCSW [phone]

## 2012-08-31 NOTE — Clinical Social Work Psychosocial (Signed)
Clinical Social Work Department BRIEF PSYCHOSOCIAL ASSESSMENT 08/31/2012  Patient:  Kurt [name], Kurt [name]     Account Number:  [id number]     Admit date:  08/30/2012  Clinical Social Worker:  Peggyann Shoals  Date/Time:  08/31/2012 02:00 PM  Referred by:  Physician  Date Referred:  08/31/2012 Referred for  SNF Placement   Other Referral:   Interview type:  Family Other interview type:    PSYCHOSOCIAL DATA Living Status:  FAMILY Admitted from facility:   Level of care:   Primary support name:  Candy Cater Primary support relationship to patient:  CHILD, ADULT Degree of support available:   supportive.    CURRENT CONCERNS Current Concerns  Post-Acute Placement   Other Concerns:    SOCIAL WORK ASSESSMENT / PLAN CSW met with pt to address consult. CSW introduced herself and explained role of social work. CSW explained process of discharge planning.    Pt is alert, but confused. Pt is agreeable to this CSW contacting pt's family to update them on discharge plan.    CSW contacted pt's daughter, Ms. Montez Morita, and explained CSW consult. Per pt's daughter, pt's wife has demenia and daughter is the main contact.    CSW will initiate SNF search and follow up with bed offers. CSW will also initiate [employer] auth.   Assessment/plan status:  Psychosocial Support/Ongoing Assessment of Needs Other assessment/ plan:   Information/referral to community resources:   SNF list    PATIENT'S/FAMILY'S RESPONSE TO PLAN OF CARE: Pt was alert and pleasantly confused. Pt's daughter is agreeable to discharge plan.   Dede Query, MSW, LCSW [phone]

## 2012-08-31 NOTE — Progress Notes (Signed)
Spoke to MD Tysin Stall, asked him if it was ok to give beta blocker to patient with a HR <60 since his BP was high, he stated it was ok to give  Kurt [name], Kurt [name]

## 2012-09-01 DIAGNOSIS — G40909 Epilepsy, unspecified, not intractable, without status epilepticus: Secondary | ICD-10-CM

## 2012-09-01 DIAGNOSIS — E785 Hyperlipidemia, unspecified: Secondary | ICD-10-CM

## 2012-09-01 DIAGNOSIS — G8190 Hemiplegia, unspecified affecting unspecified side: Secondary | ICD-10-CM

## 2012-09-01 LAB — URINE CULTURE

## 2012-09-01 LAB — GLUCOSE, CAPILLARY
Glucose-Capillary: 111 mg/dL — ABNORMAL HIGH (ref 70–99)
Glucose-Capillary: 79 mg/dL (ref 70–99)

## 2012-09-01 MED ORDER — CIPROFLOXACIN HCL 250 MG PO TABS: 250.0000 mg | ORAL_TABLET | Freq: Two times a day (BID) | ORAL | Status: DC

## 2012-09-01 MED ORDER — LEVOFLOXACIN 750 MG PO TABS
750.0000 mg | ORAL_TABLET | ORAL | Status: DC
  Administered 2012-09-01: 750 mg via ORAL
  Filled 2012-09-01: qty 1

## 2012-09-01 MED ORDER — ENSURE COMPLETE PO LIQD
237.0000 mL | Freq: Two times a day (BID) | ORAL | Status: DC
  Administered 2012-09-01 – 2012-09-02 (×2): 237 mL via ORAL

## 2012-09-01 MED ORDER — LEVOFLOXACIN 250 MG PO TABS: 250.0000 mg | ORAL_TABLET | Freq: Every day | ORAL | Status: DC

## 2012-09-01 NOTE — Clinical Social Work Psychosocial (Signed)
Clinical Social Work  CSW spoke with pt's daughter to provide bed offers. Pt's daughter will be discussing bed choice with her sister. Pt's daughter will make a discision and follow up with this CSW. CSW met with pt and provided the list of bed offers. CSW updated pt. CSW will continue to follow.   Dede Query, MSW, LCSW [phone]

## 2012-09-01 NOTE — Plan of Care (Signed)
Problem: Phase II Progression Outcomes Goal: Discharge plan established Recommend SNF for further therapy needs after acute care stay

## 2012-09-01 NOTE — Progress Notes (Signed)
[facility] PROGRESS NOTE Assessment/Plan:   *Seizure disorder, with left leg weakness (Todds phenomenon) -  Currently on Keppra - Left weakness resolved per patient, however his daughter states that he still has it. - MRI 3.24.2014: No evidence of acute infarct. - Pt consult pending, needs skilled nursing facility upon discharge, working for a bed  Hypertension: - resume home meds.  Diabetes mellitus without complication: - continues to improved. - his HBg 5.8. No episodes of hypoglycemia.  Hyperlipidemia: - statins.  CKD (chronic kidney disease), stage III: - at baseline ( 1.7-1.8)   Elevated Topronins: - 2/2 to renal disease and probable seizure ( rhabdomyolisis) check CK.  Code Status: DO NOT RESUSCITATE-per discussion with daughter.  Family Communication: Discussed with patient's daughter Ms. Rhea Pink and spouse at bedside.  Disposition Plan: SNF when bed becomes available   Consultants:  neurology  Procedures: EEG 3.24.2014: EEG is consistent with bilateral independent foci of potential epileptogenicity the right frontotemporal and left frontal regions. There is also evidence of right greater than left posterior dysfunction which could be consistent with his postictal state. There is no sign of ongoing seizure activity  Antibiotics:  none (indicate start date, and stop date if known)  HPI/Subjective: - He has no complaints for me today, however his daughter thinks he has problems with his throat in terms of pain with swallowing and a dry throat. Patient denies that.   Objective: Filed Vitals:   08/31/12 1750 08/31/12 2200 09/01/12 0500 09/01/12 0600  BP: 176/76 147/67  162/89  Pulse: 72 55  57  Temp: 99.2 F (37.3 C) 97.7 F (36.5 C)  97.8 F (36.6 C)  TempSrc: Oral Oral  Oral  Resp: 18 18  18   Height:      Weight:   99.655 kg (219 lb 11.2 oz)   SpO2: 93% 98%  100%    Intake/Output Summary (Last 24 hours) at 09/01/12 0818 Last data filed  at 09/01/12 0600  Gross per 24 hour  Intake    480 ml  Output    400 ml  Net     80 ml   Filed Weights   08/30/12 0732 09/01/12 0500  Weight: 97.523 kg (215 lb) 99.655 kg (219 lb 11.2 oz)    Exam:  General: Alert, awake, oriented x3, in no acute distress.  HEENT: No bruits, no goiter. Moist oropharynx, no erythema. Heart: Regular rate and rhythm, without murmurs, rubs, gallops.  Lungs: Good air movement, bilateral air movement.  Abdomen: Soft, nontender, nondistended, positive bowel sounds.  Neuro: Left lower extremity weakness.   Data Reviewed: Basic Metabolic Panel:  Recent Labs Lab 08/30/12 0655 08/30/12 0702 08/31/12 0420  NA 133* 139 137  K 3.9 4.2 3.8  CL 101 107 104  CO2 21  --  23  GLUCOSE 230* 231* 108*  BUN 28* 28* 29*  CREATININE 1.91* 1.90* 1.85*  CALCIUM 9.5  --  9.7   Liver Function Tests:  Recent Labs Lab 08/30/12 0655  AST 10  ALT 5  ALKPHOS 79  BILITOT 0.5  PROT 7.6  ALBUMIN 3.4*   CBC:  Recent Labs Lab 08/30/12 0655 08/30/12 0702 08/31/12 0420  WBC 12.4*  --  12.6*  NEUTROABS 10.8*  --   --   HGB 10.8* 11.2* 11.0*  HCT 30.6* 33.0* 31.8*  MCV 87.4  --  89.6  PLT 128*  --  140*   Cardiac Enzymes:  Recent Labs Lab 08/30/12 0652 08/30/12 1823 08/30/12 2307 08/31/12 0425 08/31/12  4540  CKTOTAL  --   --   --   --  771*  TROPONINI 0.58* 0.39* 0.44* 0.47*  --    CBG:  Recent Labs Lab 08/31/12 0626 08/31/12 1124 08/31/12 1618 08/31/12 2116 09/01/12 0628  GLUCAP 107* 106* 92 111* 79    Studies: Dg Chest 2 View  08/30/2012  *RADIOLOGY REPORT*  Clinical Data: Fever, hypertension, diabetes  CHEST - 2 VIEW  Comparison: 05/13/2009  Findings: Upper-normal size of cardiac silhouette. Mildly tortuous thoracic aorta with atherosclerotic calcification. Pulmonary vascularity normal. Lungs grossly clear. No pleural effusion, pneumothorax, or acute osseous abnormality.  IMPRESSION: No acute abnormalities.   Original Report  Authenticated By: Ulyses Southward, M.D.    Mr Brain Wo Contrast  08/30/2012  *RADIOLOGY REPORT*  Clinical Data: 79 year old male with right side weakness.  Previous frontal craniotomy surgery for meningioma Renal failure precludes IV contrast today.  MRI HEAD WITHOUT CONTRAST  Technique:  Multiplanar, multiecho pulse sequences of the brain and surrounding structures were obtained according to standard protocol without intravenous contrast.  Comparison: Brain MRI without and with contrast 05/11/2009.  Head CT 08/30/2012, 05/06/2009.  Findings: Absence of IV contrast limits delineation of the residual skull base tumor.  Based on the 2010 exam, chronic meningioma at the planum sphenoidale owl and involving the posterior ethmoid and superior sphenoid sinuses is estimated to measure up to 43 x 51 x 37 mm (AP by transverse by CC).  Previously, enhancing component of the tumor at a similar level measured 43 x 53 x 36 mm.  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  As before, there are obstructed secretions in the sphenoid sinuses, and these have increased.  There appears to be more frank tumor extension into the right sphenoid, while the secretions on the left are felt to be inspissated.  Underlying sphenoid sinus hyperplasia again noted, they occupy most of the clivus.  Mildly restricted diffusion in the chronic tumor, especially at the right greater wing of the sphenoid. No restricted diffusion to suggest acute infarction.  Chronic by frontal encephalomalacia has progressed.  Ex vacuo enlargement of the ventricles has not significantly changed.  No midline shift. No acute intracranial hemorrhage identified.  Major intracranial vascular flow voids are preserved.     Stable gray and white matter signal elsewhere. Negative cervicomedullary junction and visualized cervical spine.  Bone marrow signal outside the area of tumor infiltration is normal.  Orbit soft tissues within normal limits.   Maxillary sinuses and mastoids remain clear.  No acute scalp soft tissue abnormality.  IMPRESSION: 1.  No evidence of acute infarct. 2.  Chronic bulky skull base meningioma, with interval increased obstructed secretions in the sphenoid sinuses.  Gross tumor dimensions are not significantly changed since 2010 (tumor size today is estimated due to the lack of IV contrast). No associated cerebral edema. 3.  Progression of chronic bifrontal encephalomalacia which is at least in part postoperative. 4.  No new signal abnormality identified in the brain.   Original Report Authenticated By: Erskine Speed, M.D.     Scheduled Meds: . aspirin EC  81 mg Oral Daily  . atenolol  50 mg Oral Daily  . cholecalciferol  5,000 Units Oral Daily  . enoxaparin (LOVENOX) injection  40 mg Subcutaneous Q24H  . finasteride  5 mg Oral Daily  . losartan  100 mg Oral Daily   And  . hydrochlorothiazide  12.5 mg Oral Daily  . insulin aspart  0-5  Units Subcutaneous QHS  . insulin aspart  0-9 Units Subcutaneous TID WC  . insulin detemir  5 Units Subcutaneous QHS  . levETIRAcetam  500 mg Oral BID  . simvastatin  40 mg Oral QHS  . sodium chloride  3 mL Intravenous Q12H   Continuous Infusions:   Time spent: 50 minutes, for chart review, patient evaluation, and extensive time discussing with the family.  Pamella Pert  [facility] Pager [phone]. If 7 PM - 7 AM, please contact night-coverage at [email or web address], password [facility] 09/01/2012, 8:18 AM  LOS: 2 days

## 2012-09-01 NOTE — Evaluation (Signed)
Occupational Therapy Evaluation Patient Details Name: SUEDE [name] MRN: [id number] DOB: Aug 29, 1928 Today's Date: 09/01/2012 Time: 5784-6962 OT Time Calculation (min): 27 min  OT Assessment / Plan / Recommendation Clinical Impression  Pt demos declinein function with ADLs, strength, balance, safety and activity tolerance following episode of LE weakness with inability to ambulate. Pt would benefit from OT services to address these impairments to help retsore PLOF    OT Assessment  Patient needs continued OT Services    Follow Up Recommendations  SNF    Barriers to Discharge Decreased caregiver support    Equipment Recommendations  Other (comment) (TBD at SNF level of care)    Recommendations for Other Services    Frequency  Min 2X/week    Precautions / Restrictions Precautions Precautions: Fall Restrictions Weight Bearing Restrictions: No   Pertinent Vitals/Pain     ADL  Grooming: Performed;Wash/dry hands;Wash/dry face;Teeth care;Minimal assistance Where Assessed - Grooming: Supported standing total A +2 Upper Body Bathing: Simulated;Set up;Other (comment);Minimal assistance (verbal/tactile cues) Lower Body Bathing: +1 Total assistance Upper Body Dressing: Set up;Other (comment);Minimal assistance (verbal/tactile cues) Lower Body Dressing: +1 Total assistance Toilet Transfer: +2 Total assistance;Simulated Toilet Transfer Method: Sit to stand Toileting - Clothing Manipulation and Hygiene: +1 Total assistance Where Assessed - Toileting Clothing Manipulation and Hygiene: Standing Equipment Used: Gait belt;Rolling walker Transfers/Ambulation Related to ADLs: Pt required verbal and tactiel cues for correct hand placement, LE positioning, and for increasing uprigth posture during dynamic standing tasks    OT Diagnosis: Generalized weakness  OT Problem List: Decreased strength;Decreased knowledge of use of DME or AE;Decreased knowledge of precautions;Decreased activity  tolerance;Decreased cognition;Decreased safety awareness;Impaired balance (sitting and/or standing) OT Treatment Interventions:     OT Goals Acute Rehab OT Goals OT Goal Formulation: With patient Time For Goal Achievement: 09/08/12 Potential to Achieve Goals: Good ADL Goals Pt Will Perform Grooming: with set-up;with supervision;Standing at sink;Supported ADL Goal: Grooming - Progress: Goal set today Pt Will Perform Upper Body Bathing: with set-up;with supervision ADL Goal: Upper Body Bathing - Progress: Goal set today Pt Will Perform Lower Body Bathing: with mod assist;with max assist ADL Goal: Lower Body Bathing - Progress: Goal set today Pt Will Perform Upper Body Dressing: with set-up;with supervision ADL Goal: Upper Body Dressing - Progress: Goal set today Pt Will Perform Lower Body Dressing: with mod assist;with max assist ADL Goal: Lower Body Dressing - Progress: Goal set today Pt Will Transfer to Toilet: with mod assist;with max assist;with DME;Grab bars ADL Goal: Toilet Transfer - Progress: Goal set today  Visit Information  Last OT Received On: 09/01/12 Assistance Needed: +2 PT/OT Co-Evaluation/Treatment: Yes    Subjective Data  Subjective: " I'm couldn't feel any better ' Patient Stated Goal: To return home   Prior Functioning     Home Living Lives With: Spouse Available Help at Discharge: Family;Available 24 hours/day Type of Home: House Home Access: Stairs to enter [employer] of Steps: 4 Entrance Stairs-Rails: None Home Layout: One level Bathroom Shower/Tub: Engineer, manufacturing systems: Standard Home Adaptive Equipment: Straight cane Prior Function Level of Independence: Independent Able to Take Stairs?: Yes Driving: No Vocation: Retired Musician: No difficulties Dominant Hand: Right         Vision/Perception Vision - History Baseline Vision: Wears glasses only for reading Patient Visual Report: No change  from baseline Perception Perception: Within Functional Limits   Cognition  Cognition Overall Cognitive Status: Impaired Area of Impairment: Attention;Following commands;Safety/judgement;Awareness of deficits Arousal/Alertness: Awake/alert Orientation Level: Disoriented to;Time;Situation Behavior During  Session: [facility] for tasks performed Current Attention Level: Sustained Following Commands: Follows one step commands inconsistently Safety/Judgement: Decreased safety judgement for tasks assessed;Decreased awareness of safety precautions    Extremity/Trunk Assessment Right Upper Extremity Assessment RUE ROM/Strength/Tone: Deficits RUE ROM/Strength/Tone Deficits: generalized weakness Left Upper Extremity Assessment LUE ROM/Strength/Tone: Deficits LUE ROM/Strength/Tone Deficits: generalized weakness     Mobility Bed Mobility Bed Mobility: Sit to Supine Supine to Sit: 2: Max assist;With rails;HOB flat Details for Bed Mobility Assistance: assist for LE managment and trunk elevation Transfers Transfers: Sit to Stand;Stand to Sit Sit to Stand: 1: +2 Total assist;With upper extremity assist;From bed;From chair/3-in-1 Sit to Stand: Patient Percentage: 50% Stand to Sit: 3: Mod assist;With upper extremity assist;To chair/3-in-1 Details for Transfer Assistance: max directional v/c's for safety, hand placement     Exercise     Balance Balance Balance Assessed: Yes Dynamic Standing Balance Dynamic Standing - Balance Support: Left upper extremity supported Dynamic Standing - Level of Assistance: 1: +2 Total assist Dynamic Standing - Balance Activities:  (brushing teeth) Dynamic Standing - Comments: maxA to achieve/maintain bilat hip ext due. pt stood x 3 min and con't to have increased trunk/hip/knee flexion.   End of Session OT - End of Session Equipment Utilized During Treatment: Gait belt;Other (comment) (RW) Activity Tolerance: Patient tolerated treatment well Patient left: in  chair;with call bell/phone within reach  GO     Galen Manila 09/01/2012, 1:16 PM

## 2012-09-01 NOTE — Progress Notes (Signed)
INITIAL NUTRITION ASSESSMENT  Pt meets criteria for SEVERE MALNUTRITION in the context of chronic illness as evidenced by 11% weight loss x 2 months and estimated intake of </= 75% of his needs in >/= 1 month.  DOCUMENTATION CODES Per approved criteria  -Severe malnutrition in the context of chronic illness   INTERVENTION: 1. Ensure Complete po BID, each supplement provides 350 kcal and 13 grams of protein.  2. Magic cup TID between meals, each supplement provides 290 kcal and 9 grams of protein.   NUTRITION DIAGNOSIS: Inadequate oral intake related to decreased appetite/confusion as evidenced by meal completion < 50%.   Goal: Pt to meet >/= 90% of their estimated nutrition needs.   Monitor:  PO intake, supplement acceptance, weight  Reason for Assessment: Positive Malnutrition Screening Tool  79 y.o. male  Admitting Dx: Seizure disorder  ASSESSMENT: Pt admitted with seizures. PMH of meningioma S/P resection, followed by [facility] and during last visit about a year ago was told that the meningioma had grown and was recommended, knife radiation which the patient declined. Pt is pleasantly confused. Pt with left leg weakness (Todds phenomenon) which is now resolved.  Pt reports weight loss but unable to specify. States he lives with wife who drinks ensure and he will drink it sometimes as well. Spoke with daughter, Drue Flirt, over the phone who reports that pt's weight at the doctor was 247 lb in 06/2012, which would reflect a 28 lb weight loss in 2 months. Per daughter pt has had at least 10-15 lb weight loss PTA.  Per daughter pt has been eating less at home. Last night pt only ate a couple of bites of dinner per daughter. Per tech pt did not eat Breakfast this am, pt tol Per daughter pt's wife with dementia and has been a tough home situation. Discharge plan is for SNF placement.   Height: Ht Readings from Last 1 Encounters:  08/30/12 6\' 3"  (1.905 m)    Weight: Wt Readings  from Last 1 Encounters:  09/01/12 219 lb 11.2 oz (99.655 kg)    Ideal Body Weight: 89 kg  % Ideal Body Weight: 112%  Wt Readings from Last 10 Encounters:  09/01/12 219 lb 11.2 oz (99.655 kg)    Usual Body Weight: 247 lb   % Usual Body Weight: 89%  BMI:  Body mass index is 27.46 kg/(m^2). Overweight  Estimated Nutritional Needs: Kcal: 2300-2500 Protein: 120-135 grams Fluid: > 2.3 L/day  Skin: no issues noted  Nutrition Focused Physical Exam:  Subcutaneous Fat:  Orbital Region: WNL Upper Arm Region: mild depletion Thoracic and Lumbar Region: WNL  Muscle:  Temple Region: WNL Clavicle Bone Region: mild depletion Clavicle and Acromion Bone Region: WNL Scapular Bone Region: NA Dorsal Hand: WNL Patellar Region: WNL Anterior Thigh Region: mild depeltion Posterior Calf Region: WNL  Edema: not present  Diet Order: Carb Control Meal Completion: 0-85%   EDUCATION NEEDS: -No education needs identified at this time   Intake/Output Summary (Last 24 hours) at 09/01/12 0958 Last data filed at 09/01/12 0600  Gross per 24 hour  Intake    480 ml  Output    400 ml  Net     80 ml    Last BM: 3/25   Labs:   Recent Labs Lab 08/30/12 0655 08/30/12 0702 08/31/12 0420  NA 133* 139 137  K 3.9 4.2 3.8  CL 101 107 104  CO2 21  --  23  BUN 28* 28* 29*  CREATININE 1.91*  1.90* 1.85*  CALCIUM 9.5  --  9.7  GLUCOSE 230* 231* 108*    CBG (last 3)   Recent Labs  08/31/12 1618 08/31/12 2116 09/01/12 0628  GLUCAP 92 111* 79    Scheduled Meds: . aspirin EC  81 mg Oral Daily  . atenolol  50 mg Oral Daily  . cholecalciferol  5,000 Units Oral Daily  . enoxaparin (LOVENOX) injection  40 mg Subcutaneous Q24H  . finasteride  5 mg Oral Daily  . losartan  100 mg Oral Daily   And  . hydrochlorothiazide  12.5 mg Oral Daily  . insulin aspart  0-5 Units Subcutaneous QHS  . insulin aspart  0-9 Units Subcutaneous TID WC  . insulin detemir  5 Units Subcutaneous QHS  .  levETIRAcetam  500 mg Oral BID  . simvastatin  40 mg Oral QHS  . sodium chloride  3 mL Intravenous Q12H    Continuous Infusions:   Past Medical History  Diagnosis Date  . Hypertension   . Hyperlipidemia   . Seizure disorder   . CKD (chronic kidney disease), stage III   . Altered mental state 08/31/2012    "came into hospital a little disoriented; it's gotten worse; this is not normal" (08/31/2012)  . Seizures   . Type II diabetes mellitus   . Chronic lower back pain     Past Surgical History  Procedure Laterality Date  . Brain tumor excision    . Brain meningioma excision  08/2000    Kendell Bane RD, LDN, CNSC [phone] Pager [phone] After Hours Pager

## 2012-09-01 NOTE — Progress Notes (Signed)
NEURO HOSPITALIST PROGRESS NOTE   SUBJECTIVE:                                                                                                                        Feels much better today and offers no neurological complains. Stated that his left leg is back to normal. Started PT. Just switched from dilantin to levetiracetam without noticeable side effects. No further seizures.   OBJECTIVE:                                                                                                                           Vital signs in last 24 hours: Temp:  [97.4 F (36.3 C)-99.2 F (37.3 C)] 97.8 F (36.6 C) (03/26 0600) Pulse Rate:  [52-72] 57 (03/26 0600) Resp:  [18] 18 (03/26 0600) BP: (147-184)/(67-90) 162/89 mmHg (03/26 0600) SpO2:  [93 %-100 %] 100 % (03/26 0600) Weight:  [99.655 kg (219 lb 11.2 oz)] 99.655 kg (219 lb 11.2 oz) (03/26 0500)  Intake/Output from previous day: 03/25 0701 - 03/26 0700 In: 720 [P.O.:720] Out: 400 [Urine:400] Intake/Output this shift:   Nutritional status: Carb Control  Past Medical History  Diagnosis Date  . Hypertension   . Hyperlipidemia   . Seizure disorder   . CKD (chronic kidney disease), stage III   . Altered mental state 08/31/2012    "came into hospital a little disoriented; it's gotten worse; this is not normal" (08/31/2012)  . Seizures   . Type II diabetes mellitus   . Chronic lower back pain     Neurologic ROS negative with exception of above.   Neurologic Exam:  Mental Status: Alert, oriented, thought content appropriate.  Speech fluent without evidence of aphasia.  Able to follow 3 step commands without difficulty. Cranial Nerves: II: Discs flat bilaterally; Visual fields grossly normal, pupils equal, round, reactive to light and accommodation III,IV, VI: ptosis not present, extra-ocular motions intact bilaterally V,VII: smile symmetric, facial light touch sensation normal  bilaterally VIII: hearing normal bilaterally IX,X: gag reflex present XI: bilateral shoulder shrug XII: midline tongue extension Motor: Mild weakness bilateral LE, but 5/5 upper extremities bilaterally. Tone and bulk:normal tone throughout; no atrophy noted Sensory: Pinprick and light touch intact throughout, bilaterally Deep Tendon  Reflexes: 2+ and symmetric throughout Plantars: Right: downgoing   Left: downgoing Cerebellar: normal finger-to-nose,  normal heel-to-shin test Gait: no ataxia. CV: pulses palpable throughout    Lab Results: Lab Results  Component Value Date/Time   CHOL  Value: 209        ATP III CLASSIFICATION:  <200     mg/dL   Desirable  161-096  mg/dL   Borderline High  >=045    mg/dL   High       * 40/02/8118  6:25 AM   Lipid Panel No results found for this basename: CHOL, TRIG, HDL, CHOLHDL, VLDL, LDLCALC,  in the last 72 hours  Studies/Results: Dg Chest 2 View  08/30/2012  *RADIOLOGY REPORT*  Clinical Data: Fever, hypertension, diabetes  CHEST - 2 VIEW  Comparison: 05/13/2009  Findings: Upper-normal size of cardiac silhouette. Mildly tortuous thoracic aorta with atherosclerotic calcification. Pulmonary vascularity normal. Lungs grossly clear. No pleural effusion, pneumothorax, or acute osseous abnormality.  IMPRESSION: No acute abnormalities.   Original Report Authenticated By: Ulyses Southward, M.D.    Mr Brain Wo Contrast  08/30/2012  *RADIOLOGY REPORT*  Clinical Data: 79 year old male with right side weakness.  Previous frontal craniotomy surgery for meningioma Renal failure precludes IV contrast today.  MRI HEAD WITHOUT CONTRAST  Technique:  Multiplanar, multiecho pulse sequences of the brain and surrounding structures were obtained according to standard protocol without intravenous contrast.  Comparison: Brain MRI without and with contrast 05/11/2009.  Head CT 08/30/2012, 05/06/2009.  Findings: Absence of IV contrast limits delineation of the residual skull base tumor.   Based on the 2010 exam, chronic meningioma at the planum sphenoidale owl and involving the posterior ethmoid and superior sphenoid sinuses is estimated to measure up to 43 x 51 x 37 mm (AP by transverse by CC).  Previously, enhancing component of the tumor at a similar level measured 43 x 53 x 36 mm.  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  As before, there are obstructed secretions in the sphenoid sinuses, and these have increased.  There appears to be more frank tumor extension into the right sphenoid, while the secretions on the left are felt to be inspissated.  Underlying sphenoid sinus hyperplasia again noted, they occupy most of the clivus.  Mildly restricted diffusion in the chronic tumor, especially at the right greater wing of the sphenoid. No restricted diffusion to suggest acute infarction.  Chronic by frontal encephalomalacia has progressed.  Ex vacuo enlargement of the ventricles has not significantly changed.  No midline shift. No acute intracranial hemorrhage identified.  Major intracranial vascular flow voids are preserved.     Stable gray and white matter signal elsewhere. Negative cervicomedullary junction and visualized cervical spine.  Bone marrow signal outside the area of tumor infiltration is normal.  Orbit soft tissues within normal limits.  Maxillary sinuses and mastoids remain clear.  No acute scalp soft tissue abnormality.  IMPRESSION: 1.  No evidence of acute infarct. 2.  Chronic bulky skull base meningioma, with interval increased obstructed secretions in the sphenoid sinuses.  Gross tumor dimensions are not significantly changed since 2010 (tumor size today is estimated due to the lack of IV contrast). No associated cerebral edema. 3.  Progression of chronic bifrontal encephalomalacia which is at least in part postoperative. 4.  No new signal abnormality identified in the brain.   Original Report Authenticated By: Erskine Speed, M.D.      MEDICATIONS  I have reviewed the patient's current medications.  ASSESSMENT/PLAN:                                                                                                           Mr. Volden is doing well from a neurological standpoint and his Todd's paralysis left leg had resolved. Continue Keppra. No further neurological intervention needed at this time. Will sign off. Please, call neurology with questions, concerns.  Wyatt Portela, MD [facility] [phone]  09/01/2012, 8:17 AM

## 2012-09-01 NOTE — Progress Notes (Signed)
Patient is currently active with long-term disease management services with [facility] Care Management Program. Patient will receive a post discharge transition of care call and continued monthly home visits for assessments and for education if he should return home at discharge. Noted discharge plan may be for SNF. Will continue to follow.  Raiford Noble, MSN-Ed, RN,BSN, [facility], [phone]

## 2012-09-01 NOTE — Progress Notes (Signed)
Physical Therapy Treatment Patient Details Name: Kurt [name] MRN: [id number] DOB: 04-20-1929 Today's Date: 09/01/2012 Time: 4098-1191 PT Time Calculation (min): 27 min  PT Assessment / Plan / Recommendation Comments on Treatment Session  Pt progressing towards all goals however con't to required 2 person assist for all OOB mobility at this time. Pt con't to have cognitive deficits as well. Pt cont' to benefit from SNF placement upon d/c to maximize fucntional recovery.    Follow Up Recommendations  SNF;Supervision/Assistance - 24 hour     Does the patient have the potential to tolerate intense rehabilitation     Barriers to Discharge        Equipment Recommendations       Recommendations for Other Services    Frequency Min 3X/week   Plan Discharge plan remains appropriate;Frequency remains appropriate    Precautions / Restrictions Precautions Precautions: Fall Restrictions Weight Bearing Restrictions: No   Pertinent Vitals/Pain Pt denies pain    Mobility  Bed Mobility Bed Mobility: Supine to Sit Supine to Sit: 2: Max assist;With rails;HOB flat Details for Bed Mobility Assistance: assist for LE managment and trunk elevation Transfers Transfers: Sit to Stand;Stand to Sit Sit to Stand: 1: +2 Total assist;With upper extremity assist;From bed;From chair/3-in-1 Sit to Stand: Patient Percentage: 50% Stand to Sit: 3: Mod assist;With upper extremity assist;To chair/3-in-1 Details for Transfer Assistance: max directional v/c's for safety, hand placement Ambulation/Gait Ambulation/Gait Assistance: 1: +2 Total assist Ambulation/Gait: Patient Percentage: 60% Ambulation Distance (Feet): 15 Feet Assistive device: Rolling walker Ambulation/Gait Assistance Details: maxA to keep hips extended, maxA for walker management. extremely short shuffled steps, v/c's for sequencing/motor planning Gait Pattern: Step-to pattern;Decreased step length - left;Decreased stance time -  left;Decreased hip/knee flexion - left;Shuffle (decreased L foot clearance) Gait velocity: extremely slow Stairs: No    Exercises     PT Diagnosis:    PT Problem List:   PT Treatment Interventions:     PT Goals Acute Rehab PT Goals PT Goal: Supine/Side to Sit - Progress: Progressing toward goal PT Goal: Sit to Stand - Progress: Progressing toward goal PT Transfer Goal: Bed to Chair/Chair to Bed - Progress: Progressing toward goal PT Goal: Ambulate - Progress: Progressing toward goal  Visit Information  Last PT Received On: 09/01/12 Assistance Needed: +2 PT/OT Co-Evaluation/Treatment: Yes    Subjective Data  Subjective: Pt received supine in bed agreeable to PT.   Cognition  Cognition Overall Cognitive Status: Impaired Area of Impairment: Attention;Following commands;Safety/judgement;Awareness of deficits Arousal/Alertness: Awake/alert Orientation Level: Disoriented to;Time;Situation;Place Behavior During Session: [facility] for tasks performed Following Commands: Follows one step commands inconsistently Safety/Judgement: Decreased safety judgement for tasks assessed;Decreased awareness of safety precautions    Balance  Balance Balance Assessed: Yes Dynamic Standing Balance Dynamic Standing - Balance Support: Left upper extremity supported Dynamic Standing - Level of Assistance: 1: +2 Total assist Dynamic Standing - Balance Activities:  (brushing teeth) Dynamic Standing - Comments: maxA to achieve/maintain bilat hip ext due. pt stood x 3 min and con't to have increaed trunk/hip/knee flexion.  End of Session PT - End of Session Equipment Utilized During Treatment: Gait belt Activity Tolerance: Patient tolerated treatment well Patient left: in chair;with call bell/phone within reach Nurse Communication: Mobility status   GP     Marcene Brawn 09/01/2012, 1:04 PM  Lewis Shock, PT, DPT Pager #: [phone] Office #: [phone]

## 2012-09-02 LAB — GLUCOSE, CAPILLARY
Glucose-Capillary: 214 mg/dL — ABNORMAL HIGH (ref 70–99)
Glucose-Capillary: 81 mg/dL (ref 70–99)
Glucose-Capillary: 94 mg/dL (ref 70–99)

## 2012-09-02 MED ORDER — LEVOFLOXACIN 750 MG PO TABS: 750.0000 mg | ORAL_TABLET | ORAL | Status: DC

## 2012-09-02 NOTE — Progress Notes (Signed)
Patient is currently active with long-term disease management services with [facility] Care Management Program. Met with patient and family at bedside to discuss discharge plans. Family reports patient to discharge to [facility] SNF at discharge. [facility] Care Management to follow once patient returns home. Left contact information at bedside with daughter, Drue Flirt.      Raiford Noble, RN,BSN, [facility] Lia[facility], [phone]

## 2012-09-02 NOTE — Clinical Social Work Note (Signed)
Clinical Social Work   Pt is ready for discharge to [place]. Blue Medicare Berk[facility] Harvey has been obtained. Facility has received discharge summary and is ready to admit pt. Pt and family are agreeable to discharge plan. PT will provide transportation. CSW is signing off as no further needs identified.   Dede Query, MSW, LCSW [phone]

## 2012-09-02 NOTE — Progress Notes (Signed)
  Echocardiogram 2D Echocardiogram has been performed.  Kurt [name] 09/02/2012, 11:01 AM

## 2012-09-02 NOTE — Discharge Summary (Signed)
Physician Discharge Summary  Kurt [name]:[id number] DOB: 22-Dec-1928 DOA: 08/30/2012  PCP: Nadean Corwin, MD  Admit date: 08/30/2012 Discharge date: 09/02/2012  Time spent: 40 minutes  Recommendations for Outpatient Follow-up:  1. Please follow up with your PCP and neurology in 2-3 weeks  Discharge Diagnoses:  Principal Problem:   Seizure disorder, with left leg weakness (Todds phenomenon) Active Problems:   Hypertension   Diabetes mellitus without complication   Hyperlipidemia   CKD (chronic kidney disease), stage III  Discharge Condition: stable  Diet recommendation: heart healthy  Filed Weights   08/30/12 0732 09/01/12 0500  Weight: 97.523 kg (215 lb) 99.655 kg (219 lb 11.2 oz)    History of present illness:  79 y.o. male with PMH of meningioma S/P resection, followed by [facility] and during last visit about a year ago was told that the meningioma had grown and was recommended, knife radiation which the patient declined. He also has history of seizures (last seizure 10-12 months ago), type 2 diabetes mellitus, hypertension, hyperlipidemia, chronic kidney disease stage III and possible mild dementia. Unable to obtain history from patient secondary to confusion. History is obtained from patient's daughter Ms. Rhea Pink who is at bedside. He was in usual state of health until this morning. Patient spouse called Ms. Carter at 6 AM today indicating that he was not well. At 6:15 AM, she went to patient's house and found him laying in bed with generalized jerking/seizure-like activity, eyes wide open, urinary incontinence but patient was responding to questions. No tongue biting or frothing at mouth. The episode lasted until EMS got there (approximately 30 minutes if not longer). Patient was brought to the ED where he was found to have left-sided weakness. Stroke code was called. Neurology consulted. EEG does not show status. MRI brain negative for stroke and unchanged  size of meningioma. He was loaded with a gram of fosphenytoin by neurology. Currently patient is pleasantly confused and has persisting left leg weakness. Patient also had minimally elevated troponin which was discussed with [facility] cardiology who suggested that it was secondary to seizure. Hospitalist service was requested to admit for further evaluation and management. Per daughter, patient is compliant with all his medications.   Hospital Course:  Seizure disorder, with left leg weakness (Todds phenomenon) - patient's anti-seizure regimen has been changed to Keppra per Neurology recommendations instead of phenytoin. Patient was seizure free following this change. His left sided weakness has improved and patient will benefit from further PT. MRI 3.24.2014: No evidence of acute infarct.  Hypertension:  - continue home medications.  Diabetes mellitus without complication:  - continues to improved.  - his HBg 5.8. No episodes of hypoglycemia.  Hyperlipidemia:  - statins.  CKD (chronic kidney disease), stage III:  - at baseline ( 1.7-1.8)  Elevated Topronins: likely in the setting of seizure in the setting of renal disease.  UTI - noted on UA on admission. Patient is asymptomatic. Continue Levofloxacin as below for 5 more doses.  - grew E coli 10^5 sensitive to fluoroquinolones.    Procedures:  EEG 1) right frontotemporal sharp waves 2) left frontal sharp waves 3) right greater than left generalized irregular delta  Consultations:  Neurology  Discharge Exam: Filed Vitals:   09/01/12 1339 09/01/12 1755 09/01/12 2200 09/02/12 0600  BP: 148/67 152/73 141/80 163/74  Pulse: 57 59 59 59  Temp: 98 F (36.7 C) 98.4 F (36.9 C) 98.3 F (36.8 C) 97.8 F (36.6 C)  TempSrc: Oral Oral  Resp: 18 18 20 20   Height:      Weight:      SpO2: 100% 100% 100% 100%    General: NAD Cardiovascular: RRR without MRG Respiratory: CTA biL  Discharge Instructions  Discharge Orders   Future  Orders Complete By Expires     Diet - low sodium heart healthy  As directed     Increase activity slowly  As directed         Medication List    STOP taking these medications       phenytoin 100 MG ER capsule  Commonly known as:  DILANTIN      TAKE these medications       aspirin EC 81 MG tablet  Take 81 mg by mouth daily.     atenolol 100 MG tablet  Commonly known as:  TENORMIN  Take 1 tablet (100 mg total) by mouth daily.     D-3-5 5000 UNITS capsule  Generic drug:  Cholecalciferol  Take 5,000 Units by mouth daily.     finasteride 5 MG tablet  Commonly known as:  PROSCAR  Take 5 mg by mouth daily.     levETIRAcetam 500 MG tablet  Commonly known as:  KEPPRA  Take 1 tablet (500 mg total) by mouth 2 (two) times daily.     levofloxacin 750 MG tablet  Commonly known as:  LEVAQUIN  Take 1 tablet (750 mg total) by mouth every other day.     losartan-hydrochlorothiazide 100-12.5 MG per tablet  Commonly known as:  HYZAAR  Take 1 tablet by mouth daily.     metFORMIN 500 MG (MOD) 24 hr tablet  Commonly known as:  GLUMETZA  Take 1,000 mg by mouth 2 (two) times daily with a meal.     simvastatin 40 MG tablet  Commonly known as:  ZOCOR  Take 40 mg by mouth every evening.           Follow-up Information   Follow up with MCKEOWN,WILLIAM Shaarav, MD In 2 weeks. (follow up )    Contact information:   [address] [place] [address] [phone]       Follow up with [facility] In 3 weeks. (hospital follow up)    Contact information:   [address]     [address] [place] [address] [phone]       The results of significant diagnostics from this hospitalization (including imaging, microbiology, ancillary and laboratory) are listed below for reference.    Significant Diagnostic Studies: Dg Chest 2 View  08/30/2012  *RADIOLOGY REPORT*  Clinical Data: Fever, hypertension, diabetes  CHEST - 2 VIEW  Comparison:  05/13/2009  Findings: Upper-normal size of cardiac silhouette. Mildly tortuous thoracic aorta with atherosclerotic calcification. Pulmonary vascularity normal. Lungs grossly clear. No pleural effusion, pneumothorax, or acute osseous abnormality.  IMPRESSION: No acute abnormalities.   Original Report Authenticated By: Ulyses Southward, M.D.    Ct Head Wo Contrast  08/30/2012  *RADIOLOGY REPORT*  Clinical Data: Left-sided weakness.  History of brain tumor.  CT HEAD WITHOUT CONTRAST  Technique:  Contiguous axial images were obtained from the base of the skull through the vertex without contrast.  Comparison: Head CT from 05/06/2009.  Brain MRI from 05/11/2009.  Findings: There is no evidence for an acute hemorrhage.  No hydrocephalus.  The bifrontal encephalomalacia with ex vacuo dilatation of both frontal horns is stable.  There is evidence of prior frontal craniotomy. There is progression of tumor along the superior cribiform  plate, extending into the floor of the frontal skull.  The sphenoid sinuses are opacified.  On this CT scan from 05/06/2009, the right sphenoid sinus was opacified on today's study, both sphenoid sinuses are completely opacified and there is extension of the material in the posterior ethmoid air cells.  MRI from 05/11/2009 indicated that this represented residual tumor which has apparently progressed in the interval.  There is no interval substantial enlargement of the sphenoid sinuses and no gross destruction of the sphenoid or posterior ethmoid sinuses is evident.  IMPRESSION: Interval progression of tumor in the inferior frontal region with further expansion into the sphenoid sinuses and posterior ethmoid air cells.  Extensive postoperative encephalomalacia in the frontal lobes with ex vacuo dilatation of the frontal horns.  This is not substantially changed in the interval.   Original Report Authenticated By: Kennith Center, M.D.    Mr Brain Wo Contrast  08/30/2012  *RADIOLOGY REPORT*   Clinical Data: 79 year old male with right side weakness.  Previous frontal craniotomy surgery for meningioma Renal failure precludes IV contrast today.  MRI HEAD WITHOUT CONTRAST  Technique:  Multiplanar, multiecho pulse sequences of the brain and surrounding structures were obtained according to standard protocol without intravenous contrast.  Comparison: Brain MRI without and with contrast 05/11/2009.  Head CT 08/30/2012, 05/06/2009.  Findings: Absence of IV contrast limits delineation of the residual skull base tumor.  Based on the 2010 exam, chronic meningioma at the planum sphenoidale owl and involving the posterior ethmoid and superior sphenoid sinuses is estimated to measure up to 43 x 51 x 37 mm (AP by transverse by CC).  Previously, enhancing component of the tumor at a similar level measured 43 x 53 x 36 mm.  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  As before, there are obstructed secretions in the sphenoid sinuses, and these have increased.  There appears to be more frank tumor extension into the right sphenoid, while the secretions on the left are felt to be inspissated.  Underlying sphenoid sinus hyperplasia again noted, they occupy most of the clivus.  Mildly restricted diffusion in the chronic tumor, especially at the right greater wing of the sphenoid. No restricted diffusion to suggest acute infarction.  Chronic by frontal encephalomalacia has progressed.  Ex vacuo enlargement of the ventricles has not significantly changed.  No midline shift. No acute intracranial hemorrhage identified.  Major intracranial vascular flow voids are preserved.     Stable gray and white matter signal elsewhere. Negative cervicomedullary junction and visualized cervical spine.  Bone marrow signal outside the area of tumor infiltration is normal.  Orbit soft tissues within normal limits.  Maxillary sinuses and mastoids remain clear.  No acute scalp soft tissue abnormality.   IMPRESSION: 1.  No evidence of acute infarct. 2.  Chronic bulky skull base meningioma, with interval increased obstructed secretions in the sphenoid sinuses.  Gross tumor dimensions are not significantly changed since 2010 (tumor size today is estimated due to the lack of IV contrast). No associated cerebral edema. 3.  Progression of chronic bifrontal encephalomalacia which is at least in part postoperative. 4.  No new signal abnormality identified in the brain.   Original Report Authenticated By: Erskine Speed, M.D.     Microbiology: Recent Results (from the past 240 hour(s))  URINE CULTURE     Status: None   Collection Time    08/31/12  1:55 PM      Result Value Range Status  Specimen Description URINE, CLEAN CATCH   Final   Special Requests NONE   Final   Culture  Setup Time 08/31/2012 15:27   Final   Colony Count >=100,000 COLONIES/ML   Final   Culture ESCHERICHIA COLI   Final   Report Status 09/01/2012 FINAL   Final   Organism ID, Bacteria ESCHERICHIA COLI   Final     Labs: Basic Metabolic Panel:  Recent Labs Lab 08/30/12 0655 08/30/12 0702 08/31/12 0420  NA 133* 139 137  K 3.9 4.2 3.8  CL 101 107 104  CO2 21  --  23  GLUCOSE 230* 231* 108*  BUN 28* 28* 29*  CREATININE 1.91* 1.90* 1.85*  CALCIUM 9.5  --  9.7   Liver Function Tests:  Recent Labs Lab 08/30/12 0655  AST 10  ALT 5  ALKPHOS 79  BILITOT 0.5  PROT 7.6  ALBUMIN 3.4*   CBC:  Recent Labs Lab 08/30/12 0655 08/30/12 0702 08/31/12 0420  WBC 12.4*  --  12.6*  NEUTROABS 10.8*  --   --   HGB 10.8* 11.2* 11.0*  HCT 30.6* 33.0* 31.8*  MCV 87.4  --  89.6  PLT 128*  --  140*   Cardiac Enzymes:  Recent Labs Lab 08/30/12 0652 08/30/12 1823 08/30/12 2307 08/31/12 0425 08/31/12 0834  CKTOTAL  --   --   --   --  771*  TROPONINI 0.58* 0.39* 0.44* 0.47*  --    BNP: BNP (last 3 results) No results found for this basename: PROBNP,  in the last 8760 hours CBG:  Recent Labs Lab 09/01/12 0628  09/01/12 1146 09/01/12 1636 09/01/12 2153 09/02/12 0649  GLUCAP 79 125* 214* 81 90    Signed:  GHERGHE, COSTIN  [facility] 09/02/2012, 8:09 AM

## 2012-09-03 ENCOUNTER — Non-Acute Institutional Stay (SKILLED_NURSING_FACILITY): Payer: Medicare Other

## 2012-09-03 DIAGNOSIS — G40909 Epilepsy, unspecified, not intractable, without status epilepticus: Secondary | ICD-10-CM | POA: Diagnosis not present

## 2012-09-03 DIAGNOSIS — N183 Chronic kidney disease, stage 3 unspecified: Secondary | ICD-10-CM

## 2012-09-03 DIAGNOSIS — E119 Type 2 diabetes mellitus without complications: Secondary | ICD-10-CM

## 2012-09-03 DIAGNOSIS — D62 Acute posthemorrhagic anemia: Secondary | ICD-10-CM | POA: Diagnosis not present

## 2012-09-03 DIAGNOSIS — I10 Essential (primary) hypertension: Secondary | ICD-10-CM

## 2012-09-03 DIAGNOSIS — R269 Unspecified abnormalities of gait and mobility: Secondary | ICD-10-CM

## 2012-09-03 NOTE — Progress Notes (Signed)
Date: 09/03/2012  MRN:  [id number] Name:  Kurt [name] Sex:  male Age:  79 y.o. DOB:[birth year]   [facility] #:    [id number]                   Facility/Room;[facility] Level Of Care:SNF  Provider: Dr Murray Hodgkins  Emergency Contacts: Contact Information   Name Relation Home Work Mobile   Marion Spouse [phone]     Luisa Dago Daughter   [phone]      Code Status:Full code MOST Form:  Allergies:No Known Allergies   Chief Complaint  Patient presents with  . Medical Managment of Chronic Issues    New admit to SNF following hospitalziation [facility]   Admit to [facility] 08/30/12 to 09/02/12  HPI:79 y.o. male with PMH of meningioma S/P resection, followed by [facility] and during last visit about a year ago was told that the meningioma had grown and was recommended, knife radiation which the patient declined. He also has history of seizures (last seizure 10-12 months ago), type 2 diabetes mellitus, hypertension, hyperlipidemia, chronic kidney disease stage III and possible mild dementia. Unable to obtain history from patient secondary to confusion. History is obtained from patient's daughter Ms. Rhea Pink who is at bedside. He was in usual state of health until this morning. Patient spouse called Ms. Carter at 6 AM today indicating that he was not well. At 6:15 AM, she went to patient's house and found him laying in bed with generalized jerking/seizure-like activity, eyes wide open, urinary incontinence but patient was responding to questions. No tongue biting or frothing at mouth. The episode lasted until EMS got there (approximately 30 minutes if not longer). Patient was brought to the ED where he was found to have left-sided weakness. Stroke code was called. Neurology consulted. EEG does not show status. MRI brain negative for stroke and unchanged size of meningioma. He was loaded with a gram of fosphenytoin by neurology. Currently patient is pleasantly confused and has  persisting left leg weakness. Patient also had minimally elevated troponin which was discussed with [facility] cardiology who suggested that it was secondary to seizure. Hospitalist service was requested to admit for further evaluation and management. Per daughter, patient is compliant with all his medications.    Past Medical History  Diagnosis Date  . Hypertension   . Hyperlipidemia   . Seizure disorder   . CKD (chronic kidney disease), stage III   . Altered mental state 08/31/2012    "came into hospital a little disoriented; it's gotten worse; this is not normal" (08/31/2012)  . Seizures   . Type II diabetes mellitus   . Chronic lower back pain     Past Surgical History  Procedure Laterality Date  . Brain tumor excision    . Brain meningioma excision  08/2000     Procedures:Dg Chest 2 View  08/30/2012  *RADIOLOGY REPORT*  Clinical Data: Fever, hypertension, diabetes  CHEST - 2 VIEW  Comparison: 05/13/2009  Findings: Upper-normal size of cardiac silhouette. Mildly tortuous thoracic aorta with atherosclerotic calcification. Pulmonary vascularity normal. Lungs grossly clear. No pleural effusion, pneumothorax, or acute osseous abnormality.  IMPRESSION: No acute abnormalities.   Original Report Authenticated By: Ulyses Southward, M.D.    Ct Head Wo Contrast  08/30/2012  *RADIOLOGY REPORT*  Clinical Data: Left-sided weakness.  History of brain tumor.  CT HEAD WITHOUT CONTRAST  Technique:  Contiguous axial images were obtained from the base of the skull through the vertex without contrast.  Comparison: Head  CT from 05/06/2009.  Brain MRI from 05/11/2009.  Findings: There is no evidence for an acute hemorrhage.  No hydrocephalus.  The bifrontal encephalomalacia with ex vacuo dilatation of both frontal horns is stable.  There is evidence of prior frontal craniotomy. There is progression of tumor along the superior cribiform plate, extending into the floor of the frontal skull.  The sphenoid sinuses are  opacified.  On this CT scan from 05/06/2009, the right sphenoid sinus was opacified on today's study, both sphenoid sinuses are completely opacified and there is extension of the material in the posterior ethmoid air cells.  MRI from 05/11/2009 indicated that this represented residual tumor which has apparently progressed in the interval.  There is no interval substantial enlargement of the sphenoid sinuses and no gross destruction of the sphenoid or posterior ethmoid sinuses is evident.  IMPRESSION: Interval progression of tumor in the inferior frontal region with further expansion into the sphenoid sinuses and posterior ethmoid air cells.  Extensive postoperative encephalomalacia in the frontal lobes with ex vacuo dilatation of the frontal horns.  This is not substantially changed in the interval.   Original Report Authenticated By: Kennith Center, M.D.    Mr Brain Wo Contrast  08/30/2012  *RADIOLOGY REPORT*  Clinical Data: 79 year old male with right side weakness.  Previous frontal craniotomy surgery for meningioma Renal failure precludes IV contrast today.  MRI HEAD WITHOUT CONTRAST  Technique:  Multiplanar, multiecho pulse sequences of the brain and surrounding structures were obtained according to standard protocol without intravenous contrast.  Comparison: Brain MRI without and with contrast 05/11/2009.  Head CT 08/30/2012, 05/06/2009.  Findings: Absence of IV contrast limits delineation of the residual skull base tumor.  Based on the 2010 exam, chronic meningioma at the planum sphenoidale owl and involving the posterior ethmoid and superior sphenoid sinuses is estimated to measure up to 43 x 51 x 37 mm (AP by transverse by CC).  Previously, enhancing component of the tumor at a similar level measured 43 x 53 x 36 mm.  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  As before, there are obstructed secretions in the sphenoid sinuses, and these have increased.  There appears to be  more frank tumor extension into the right sphenoid, while the secretions on the left are felt to be inspissated.  Underlying sphenoid sinus hyperplasia again noted, they occupy most of the clivus.  Mildly restricted diffusion in the chronic tumor, especially at the right greater wing of the sphenoid. No restricted diffusion to suggest acute infarction.  Chronic by frontal encephalomalacia has progressed.  Ex vacuo enlargement of the ventricles has not significantly changed.  No midline shift. No acute intracranial hemorrhage identified.  Major intracranial vascular flow voids are preserved.     Stable gray and white matter signal elsewhere. Negative cervicomedullary junction and visualized cervical spine.  Bone marrow signal outside the area of tumor infiltration is normal.  Orbit soft tissues within normal limits.  Maxillary sinuses and mastoids remain clear.  No acute scalp soft tissue abnormality.  IMPRESSION: 1.  No evidence of acute infarct. 2.  Chronic bulky skull base meningioma, with interval increased obstructed secretions in the sphenoid sinuses.  Gross tumor dimensions are not significantly changed since 2010 (tumor size today is estimated due to the lack of IV contrast). No associated cerebral edema. 3.  Progression of chronic bifrontal encephalomalacia which is at least in part postoperative. 4.  No new signal abnormality identified in the brain.  Original Report Authenticated By: Erskine Speed, M.D.    EEG 1) right frontotemporal sharp waves 2) left frontal sharp waves 3) right greater than left generalized irregular delta     Consultants: PCP: Nadean Corwin, MD   OP Specialists:   1. [facility] Neurosurgery  Current Outpatient Prescriptions  Medication Sig Dispense Refill  . aspirin EC 81 MG tablet Take 81 mg by mouth daily.      Marland Kitchen atenolol (TENORMIN) 100 MG tablet Take 1 tablet (100 mg total) by mouth daily.  30 tablet  0  . Cholecalciferol (D-3-5) 5000 UNITS capsule Take 5,000  Units by mouth daily.      . finasteride (PROSCAR) 5 MG tablet Take 5 mg by mouth daily.      Marland Kitchen levETIRAcetam (KEPPRA) 500 MG tablet Take 1 tablet (500 mg total) by mouth 2 (two) times daily.  60 tablet  0  . levofloxacin (LEVAQUIN) 750 MG tablet Take 1 tablet (750 mg total) by mouth every other day.  5 tablet  0  . losartan-hydrochlorothiazide (HYZAAR) 100-12.5 MG per tablet Take 1 tablet by mouth daily.      . metFORMIN (GLUMETZA) 500 MG (MOD) 24 hr tablet Take 1,000 mg by mouth 2 (two) times daily with a meal.      . simvastatin (ZOCOR) 40 MG tablet Take 40 mg by mouth every evening.       No current facility-administered medications for this visit.    Immunization History  Administered Date(s) Administered  . Influenza-Generic 02/22/2012     History  Substance Use Topics  . Smoking status: Never Smoker   . Smokeless tobacco: Never Used  . Alcohol Use: No    Family History  Problem Relation Age of Onset  . Hypertension Daughter      Review of systems Gen.: Compliant appropriately responsive HEENT: Prior brain surgery with residual scarring Respiratory: No complaint  Cardiac: No complaints. Denies palpitations or chest pain GI: Denies reflux or abdominal discomfort. No diarrhea or constipation. GU: Recent urinary tract infection. No current dysuria or incontinence. History of CK 83 Musculoskeletal: Gait instability Neurologic : Memory deficiency. Seizure disorder. History of meningioma with resection.   Endocrine: Diabetes mellitus treated with Glumetza Hematologic: No complaints   Vital signs: BP 116/71  Pulse 65  SpO2 95%  Physical Exam  Constitutional: He appears well-developed and well-nourished. No distress.  Eyes:  Prescription lenses  Neck: No JVD present. No tracheal deviation present. No thyromegaly present.  Cardiovascular: Normal rate, regular rhythm, normal heart sounds and intact distal pulses.  Exam reveals no gallop and no friction rub.   No murmur  heard. Respiratory: No respiratory distress. He has no wheezes. He has no rales. He exhibits no tenderness.  GI: He exhibits no distension and no mass. There is no tenderness. There is no guarding.  Musculoskeletal: Normal range of motion. He exhibits no edema or tenderness.  Weakness in legs. Unstable on standing.  Lymphadenopathy:    He has no cervical adenopathy.  Neurological: He is alert. No cranial nerve deficit. Coordination normal.  Unstable with standing  Skin:  Scar right forehead   Problems and plans 1. Seizure disorder, with left leg weakness (Todds phenomenon) Continue current medication  2. Essential hypertension Controlled  3. Diabetes mellitus without complication Controlled  4. Acute blood loss anemia Recheck lab  5. CKD (chronic kidney disease), stage III Recheck lab  6. Abnormality of gait Engaged in physical therapy.

## 2012-09-27 ENCOUNTER — Emergency Department (HOSPITAL_COMMUNITY): Payer: Medicare Other

## 2012-09-27 ENCOUNTER — Inpatient Hospital Stay (HOSPITAL_COMMUNITY)
Admission: EM | Admit: 2012-09-27 | Discharge: 2012-10-07 | DRG: 683 | Disposition: A | Discharge status: Skilled Nursing Facility | Payer: Medicare Other | Attending: Internal Medicine | Admitting: Internal Medicine

## 2012-09-27 ENCOUNTER — Encounter: Payer: Self-pay | Admitting: Nurse Practitioner

## 2012-09-27 ENCOUNTER — Non-Acute Institutional Stay (SKILLED_NURSING_FACILITY): Payer: Medicare Other | Admitting: Nurse Practitioner

## 2012-09-27 ENCOUNTER — Encounter (HOSPITAL_COMMUNITY): Payer: Self-pay | Admitting: Neurology

## 2012-09-27 DIAGNOSIS — R4182 Altered mental status, unspecified: Secondary | ICD-10-CM

## 2012-09-27 DIAGNOSIS — G40909 Epilepsy, unspecified, not intractable, without status epilepticus: Secondary | ICD-10-CM | POA: Diagnosis present

## 2012-09-27 DIAGNOSIS — N189 Chronic kidney disease, unspecified: Secondary | ICD-10-CM

## 2012-09-27 DIAGNOSIS — N170 Acute kidney failure with tubular necrosis: Principal | ICD-10-CM | POA: Diagnosis present

## 2012-09-27 DIAGNOSIS — N183 Chronic kidney disease, stage 3 unspecified: Secondary | ICD-10-CM | POA: Diagnosis present

## 2012-09-27 DIAGNOSIS — E875 Hyperkalemia: Secondary | ICD-10-CM | POA: Diagnosis present

## 2012-09-27 DIAGNOSIS — R1310 Dysphagia, unspecified: Secondary | ICD-10-CM | POA: Diagnosis present

## 2012-09-27 DIAGNOSIS — I10 Essential (primary) hypertension: Secondary | ICD-10-CM | POA: Diagnosis present

## 2012-09-27 DIAGNOSIS — E785 Hyperlipidemia, unspecified: Secondary | ICD-10-CM | POA: Diagnosis present

## 2012-09-27 DIAGNOSIS — E870 Hyperosmolality and hypernatremia: Secondary | ICD-10-CM | POA: Diagnosis present

## 2012-09-27 DIAGNOSIS — E872 Acidosis, unspecified: Secondary | ICD-10-CM | POA: Diagnosis present

## 2012-09-27 DIAGNOSIS — M545 Low back pain, unspecified: Secondary | ICD-10-CM | POA: Diagnosis present

## 2012-09-27 DIAGNOSIS — F0390 Unspecified dementia without behavioral disturbance: Secondary | ICD-10-CM | POA: Diagnosis present

## 2012-09-27 DIAGNOSIS — Z66 Do not resuscitate: Secondary | ICD-10-CM | POA: Diagnosis present

## 2012-09-27 DIAGNOSIS — D509 Iron deficiency anemia, unspecified: Secondary | ICD-10-CM | POA: Diagnosis present

## 2012-09-27 DIAGNOSIS — I959 Hypotension, unspecified: Secondary | ICD-10-CM | POA: Diagnosis present

## 2012-09-27 DIAGNOSIS — G8929 Other chronic pain: Secondary | ICD-10-CM | POA: Diagnosis present

## 2012-09-27 DIAGNOSIS — K1120 Sialoadenitis, unspecified: Secondary | ICD-10-CM | POA: Diagnosis present

## 2012-09-27 DIAGNOSIS — R1313 Dysphagia, pharyngeal phase: Secondary | ICD-10-CM | POA: Diagnosis present

## 2012-09-27 DIAGNOSIS — E119 Type 2 diabetes mellitus without complications: Secondary | ICD-10-CM | POA: Diagnosis present

## 2012-09-27 DIAGNOSIS — N179 Acute kidney failure, unspecified: Secondary | ICD-10-CM

## 2012-09-27 DIAGNOSIS — I129 Hypertensive chronic kidney disease with stage 1 through stage 4 chronic kidney disease, or unspecified chronic kidney disease: Secondary | ICD-10-CM | POA: Diagnosis present

## 2012-09-27 DIAGNOSIS — E860 Dehydration: Secondary | ICD-10-CM

## 2012-09-27 LAB — CBC WITH DIFFERENTIAL/PLATELET
Basophils Absolute: 0 10*3/uL (ref 0.0–0.1)
Basophils Relative: 0 % (ref 0–1)
Hemoglobin: 12 g/dL — ABNORMAL LOW (ref 13.0–17.0)
MCHC: 34.8 g/dL (ref 30.0–36.0)
Monocytes Relative: 5 % (ref 3–12)
Neutro Abs: 13.6 10*3/uL — ABNORMAL HIGH (ref 1.7–7.7)
Neutrophils Relative %: 87 % — ABNORMAL HIGH (ref 43–77)

## 2012-09-27 LAB — COMPREHENSIVE METABOLIC PANEL
ALT: 10 U/L (ref 0–53)
AST: 16 U/L (ref 0–37)
Albumin: 3.4 g/dL — ABNORMAL LOW (ref 3.5–5.2)
Alkaline Phosphatase: 52 U/L (ref 39–117)
Chloride: 117 mEq/L — ABNORMAL HIGH (ref 96–112)
Potassium: 5.8 mEq/L — ABNORMAL HIGH (ref 3.5–5.1)
Sodium: 148 mEq/L — ABNORMAL HIGH (ref 135–145)
Total Bilirubin: 0.3 mg/dL (ref 0.3–1.2)

## 2012-09-27 LAB — URINALYSIS, ROUTINE W REFLEX MICROSCOPIC
Glucose, UA: NEGATIVE mg/dL
Ketones, ur: NEGATIVE mg/dL
Protein, ur: NEGATIVE mg/dL

## 2012-09-27 LAB — POCT I-STAT 3, ART BLOOD GAS (G3+)
Acid-base deficit: 14 mmol/L — ABNORMAL HIGH (ref 0.0–2.0)
Bicarbonate: 12 mEq/L — ABNORMAL LOW (ref 20.0–24.0)
O2 Saturation: 99 %
pCO2 arterial: 26.7 mmHg — ABNORMAL LOW (ref 35.0–45.0)
pO2, Arterial: 148 mmHg — ABNORMAL HIGH (ref 80.0–100.0)

## 2012-09-27 LAB — URINE MICROSCOPIC-ADD ON

## 2012-09-27 MED ORDER — STERILE WATER FOR INJECTION IV SOLN
INTRAVENOUS | Status: DC
  Administered 2012-09-27: 23:00:00 via INTRAVENOUS
  Filled 2012-09-27 (×3): qty 850

## 2012-09-27 MED ORDER — SODIUM CHLORIDE 0.9 % IV BOLUS (SEPSIS)
1000.0000 mL | Freq: Once | INTRAVENOUS | Status: AC
  Administered 2012-09-27: 1000 mL via INTRAVENOUS

## 2012-09-27 MED ORDER — DEXAMETHASONE SODIUM PHOSPHATE 10 MG/ML IJ SOLN
10.0000 mg | Freq: Three times a day (TID) | INTRAMUSCULAR | Status: AC
  Administered 2012-09-27 – 2012-09-29 (×6): 10 mg via INTRAVENOUS
  Filled 2012-09-27 (×7): qty 1

## 2012-09-27 MED ORDER — INSULIN ASPART 100 UNIT/ML ~~LOC~~ SOLN
0.0000 [IU] | Freq: Three times a day (TID) | SUBCUTANEOUS | Status: DC
  Administered 2012-09-28: 2 [IU] via SUBCUTANEOUS

## 2012-09-27 MED ORDER — ACETAMINOPHEN 650 MG RE SUPP: 650.0000 mg | Freq: Four times a day (QID) | RECTAL | Status: DC | PRN

## 2012-09-27 MED ORDER — ONDANSETRON HCL 4 MG PO TABS: 4.0000 mg | ORAL_TABLET | Freq: Four times a day (QID) | ORAL | Status: DC | PRN

## 2012-09-27 MED ORDER — ONDANSETRON HCL 8 MG PO TABS
4.0000 mg | ORAL_TABLET | Freq: Four times a day (QID) | ORAL | Status: DC | PRN
  Filled 2012-09-27: qty 0.5

## 2012-09-27 MED ORDER — VANCOMYCIN HCL 10 G IV SOLR
1750.0000 mg | INTRAVENOUS | Status: AC
  Administered 2012-09-27: 1750 mg via INTRAVENOUS
  Filled 2012-09-27: qty 1750

## 2012-09-27 MED ORDER — HYDRALAZINE HCL 20 MG/ML IJ SOLN
10.0000 mg | INTRAMUSCULAR | Status: DC | PRN
  Administered 2012-10-04: 10 mg via INTRAVENOUS
  Filled 2012-09-27: qty 1

## 2012-09-27 MED ORDER — SODIUM CHLORIDE 0.9 % IJ SOLN
3.0000 mL | Freq: Two times a day (BID) | INTRAMUSCULAR | Status: DC
  Administered 2012-09-28 – 2012-10-05 (×6): 3 mL via INTRAVENOUS

## 2012-09-27 MED ORDER — SODIUM CHLORIDE 0.9 % IV SOLN
500.0000 mg | Freq: Two times a day (BID) | INTRAVENOUS | Status: DC
  Administered 2012-09-28 – 2012-10-01 (×9): 500 mg via INTRAVENOUS
  Filled 2012-09-27 (×12): qty 5

## 2012-09-27 MED ORDER — PIPERACILLIN-TAZOBACTAM IN DEX 2-0.25 GM/50ML IV SOLN
2.2500 g | Freq: Three times a day (TID) | INTRAVENOUS | Status: DC
  Administered 2012-09-28 – 2012-10-01 (×10): 2.25 g via INTRAVENOUS
  Filled 2012-09-27 (×15): qty 50

## 2012-09-27 MED ORDER — SODIUM POLYSTYRENE SULFONATE 15 GM/60ML PO SUSP
30.0000 g | Freq: Once | ORAL | Status: AC
  Administered 2012-09-28: 30 g
  Filled 2012-09-27: qty 120

## 2012-09-27 MED ORDER — ACETAMINOPHEN 325 MG PO TABS: 650.0000 mg | ORAL_TABLET | Freq: Four times a day (QID) | ORAL | Status: DC | PRN

## 2012-09-27 MED ORDER — PIPERACILLIN-TAZOBACTAM IN DEX 2-0.25 GM/50ML IV SOLN
2.2500 g | INTRAVENOUS | Status: AC
  Administered 2012-09-27: 2.25 g via INTRAVENOUS
  Filled 2012-09-27: qty 50

## 2012-09-27 MED ORDER — CLINDAMYCIN PHOSPHATE 300 MG/50ML IV SOLN
300.0000 mg | Freq: Four times a day (QID) | INTRAVENOUS | Status: DC
  Administered 2012-09-28 (×2): 300 mg via INTRAVENOUS
  Filled 2012-09-27 (×3): qty 50

## 2012-09-27 MED ORDER — ONDANSETRON HCL 4 MG/2ML IJ SOLN: 4.0000 mg | Freq: Four times a day (QID) | INTRAMUSCULAR | Status: DC | PRN

## 2012-09-27 NOTE — Progress Notes (Signed)
Unable to get ABG as patient is at ultrasound.

## 2012-09-27 NOTE — ED Notes (Signed)
Per family, pt is at baseline. Pt maintaining O2 stats. Pt suctioned. VSS.

## 2012-09-27 NOTE — ED Notes (Signed)
Report given  Moved to C25

## 2012-09-27 NOTE — Consult Note (Signed)
Rodman, Recupero [id number] Mar 21, 1929 Gwyneth Sprout, MD  Reason for Consult: right submandibular sialoadenitis  HPI: 79yo AAM with acute renal failure and right neck swelling. CT neck without contrast showed hypotrophic left 9mm submandibular gland and right homogeneous 2x3 cm submandibular gland without obvious mass or abscess and surrounding soft tissue stranding likely submandibular sialoadenitis. ENT consulted for right submandibular sialoadenitis.  Allergies: No Known Allergies  ROS: right submandibular swelling, otherwise negative x 10 systems except per HPI  PMH:  Past Medical History  Diagnosis Date  . Hypertension   . Hyperlipidemia   . Seizure disorder   . CKD (chronic kidney disease), stage III   . Altered mental state 08/31/2012    "came into hospital a little disoriented; it's gotten worse; this is not normal" (08/31/2012)  . Seizures   . Type II diabetes mellitus   . Chronic lower back pain     FH:  Family History  Problem Relation Age of Onset  . Hypertension Daughter     SH:  History   Social History  . Marital Status: Married    Spouse Name: N/A    Number of Children: N/A  . Years of Education: N/A   Occupational History  . Not on file.   Social History Main Topics  . Smoking status: Never Smoker   . Smokeless tobacco: Never Used  . Alcohol Use: No  . Drug Use: No  . Sexually Active: Not on file   Other Topics Concern  . Not on file   Social History Narrative  . No narrative on file    PSH:  Past Surgical History  Procedure Laterality Date  . Brain tumor excision    . Brain meningioma excision  08/2000    Physical  Exam: CN 2-12 grossly intact and symmetric. EAC/TMs normal BL. Oral cavity, lips, gums, ororpharynx normal with no obvious masses or lesions. Skin warm and dry. Nasal cavity without polyps or purulence. External nose and ears without masses or lesions. EOMI, PERRLA. Neck with firm induration of right submandibular area  without obvious fluctuance or overlying cellulitis.   A/P: right submandibular sialoadenitis without obvious mass, abscess, or sialolith on CT neck. Recommend IV coverage for oral cavity flora with Zosyn or clindamycin or Unasyn, and coverage for MRSA with vancomycin or Bactrim or Doxycycline. Needs warm compresses to right submandibular area and IV or oral hydration, and sialogogues if possible  (lemon wedges, etc.). Needs IV steroids then PO steroid taper for 7-10 days for the swelling. I ordered an ultrasound guided FNA of the right submandibular gland since radiology noted they could not rule out a mass on noncontrast CT. Primary team can monitor for clinical improvement and he can see ENT back as outpatient as needed.   Melvenia Beam 09/27/2012 10:50 PM

## 2012-09-27 NOTE — Progress Notes (Addendum)
ANTIBIOTIC CONSULT NOTE - INITIAL  Pharmacy Consult for Vancomycin + Zosyn Indication: Empiric coverage for submandibular abscess  No Known Allergies  Patient Measurements: Height: 6' 3.2" (191 cm) Weight: 219 lb 12.8 oz (99.7 kg) IBW/kg (Calculated) : 84.95  Vital Signs: Temp: 97.8 F (36.6 C) (04/21 1622) BP: 108/68 mmHg (04/21 1536) Pulse Rate: 67 (04/21 1622) Intake/Output from previous day:   Intake/Output from this shift:    Labs:  Recent Labs  09/27/12 1640  WBC 15.7*  HGB 12.0*  PLT 98*  CREATININE 8.12*   Estimated Creatinine Clearance: 8.6 ml/min (by C-G formula based on Cr of 8.12). No results found for this basename: VANCOTROUGH, Leodis Binet, VANCORANDOM, GENTTROUGH, GENTPEAK, GENTRANDOM, TOBRATROUGH, TOBRAPEAK, TOBRARND, AMIKACINPEAK, AMIKACINTROU, AMIKACIN,  in the last 72 hours   Microbiology: Recent Results (from the past 720 hour(s))  URINE CULTURE     Status: None   Collection Time    08/31/12  1:55 PM      Result Value Range Status   Specimen Description URINE, CLEAN CATCH   Final   Special Requests NONE   Final   Culture  Setup Time 08/31/2012 15:27   Final   Colony Count >=100,000 COLONIES/ML   Final   Culture ESCHERICHIA COLI   Final   Report Status 09/01/2012 FINAL   Final   Organism ID, Bacteria ESCHERICHIA COLI   Final    Medical History: Past Medical History  Diagnosis Date  . Hypertension   . Hyperlipidemia   . Seizure disorder   . CKD (chronic kidney disease), stage III   . Altered mental state 08/31/2012    "came into hospital a little disoriented; it's gotten worse; this is not normal" (08/31/2012)  . Seizures   . Type II diabetes mellitus   . Chronic lower back pain     Assessment: 79 y.o. M who presented to the [facility] on 09/27/12 with AMS and increasing neck mass/abscess. Pharmacy was consulted to start Vancomycin + Zosyn for empiric coverage for a possible submandibular infection/abscess. The patient is noted to have acute  renal failure with a SCr 8.12 (baseline 1.8-2). Wt: 99.7 kg. Will plan to load Vancomycin and then will hold off on additional doses for now while monitoring trends in renal function.   Goal of Therapy:  Vancomycin trough level 10-15 mcg/ml Proper antibiotics for infection/cultures adjusted for renal/hepatic function   Plan:  1. Vancomycin 1750 mg IV x 1 dose 2. Zosyn 2.25g IV every 8 hours 3. Will monitor trends in renal function prior to entering any additional Vancomycin doses.  4. Will continue to follow renal function, culture results, LOT, and antibiotic de-escalation plans   Georgina Pillion, PharmD, BCPS Clinical Pharmacist Pager: [phone] 09/27/2012 7:36 PM

## 2012-09-27 NOTE — ED Notes (Signed)
MD Hosie Poisson made aware that pressure is lowed. Bolus started.

## 2012-09-27 NOTE — Progress Notes (Signed)
Patient ID: Kurt [name], male   DOB: Jun 17, 1928, 79 y.o.   MRN: [id number]  Chief Complaint: AMS  HPI:  79 yearold male who is at [facility] for short term rehab. Was participating and doing well until last week and then progression stopped. Wife and daughter at bedside and daughter providing most of the history. Wife has history of dementia. Currently pt is very lethargic and unable to answer questions or follow commands. Daughter reports he has not wanted to eat anything in several days and has been holding food and medications in his oral cavity. Staff reports increase secretions and gurgling today. Also noted a large nodule on right side of neck/throat that has gotten greatly increased in size over the past 24 hours. Recently hospitalized due to seizures- no recent seizures noted.   Review of Systems:  Review of Systems  Unable to perform ROS: mental status change  pt unable to provide history or answer question.    Medications: Patient's Medications  New Prescriptions   No medications on file  Previous Medications   ASPIRIN EC 81 MG TABLET    Take 81 mg by mouth daily.   ATENOLOL (TENORMIN) 100 MG TABLET    Take 1 tablet (100 mg total) by mouth daily.   CHOLECALCIFEROL (D-3-5) 5000 UNITS CAPSULE    Take 5,000 Units by mouth daily.   FINASTERIDE (PROSCAR) 5 MG TABLET    Take 5 mg by mouth daily.   LEVETIRACETAM (KEPPRA) 500 MG TABLET    Take 500 mg by mouth every 12 (twelve) hours.   LOSARTAN-HYDROCHLOROTHIAZIDE (HYZAAR) 100-12.5 MG PER TABLET    Take 1 tablet by mouth daily.   METFORMIN (GLUMETZA) 500 MG (MOD) 24 HR TABLET    Take 1,000 mg by mouth 2 (two) times daily with a meal.   SIMVASTATIN (ZOCOR) 40 MG TABLET    Take 40 mg by mouth every evening.  Modified Medications   No medications on file  Discontinued Medications   LEVETIRACETAM (KEPPRA) 500 MG TABLET    Take 1 tablet (500 mg total) by mouth 2 (two) times daily.   LEVOFLOXACIN (LEVAQUIN) 750 MG TABLET    Take 1 tablet  (750 mg total) by mouth every other day.     Physical Exam: Physical Exam  Constitutional: He appears lethargic. He appears ill. No distress.  HENT:  Mouth/Throat: Oropharynx is clear and moist and mucous membranes are normal.  Neck:  Enlarged mass to right of trachea tender to touch  Cardiovascular: Normal rate and regular rhythm.   Pulmonary/Chest: Effort normal. He has rales (heard throughout the right lung).  Abdominal: Soft. Bowel sounds are normal.  Neurological: He appears lethargic.  Skin: Skin is warm and dry.     Filed Vitals:   09/27/12 1536  BP: 108/68  Pulse: 64  Temp: 97.8 F (36.6 C)  Resp: 18      Labs reviewed: Basic Metabolic Panel:  Recent Labs  40/98/11 0655 08/30/12 0702 08/31/12 0420  NA 133* 139 137  K 3.9 4.2 3.8  CL 101 107 104  CO2 21  --  23  GLUCOSE 230* 231* 108*  BUN 28* 28* 29*  CREATININE 1.91* 1.90* 1.85*  CALCIUM 9.5  --  9.7    Liver Function Tests:  Recent Labs  08/30/12 0655  AST 10  ALT 5  ALKPHOS 79  BILITOT 0.5  PROT 7.6  ALBUMIN 3.4*    CBC:  Recent Labs  08/30/12 0655 08/30/12 0702 08/31/12 0420  WBC  12.4*  --  12.6*  NEUTROABS 10.8*  --   --   HGB 10.8* 11.2* 11.0*  HCT 30.6* 33.0* 31.8*  MCV 87.4  --  89.6  PLT 128*  --  140*    Assessment/Plan AMS-  At this time will send to the ED for further evaluation of AMS and for mass on right side of neck to ensure airway protection

## 2012-09-27 NOTE — ED Notes (Signed)
Family at bedside. 

## 2012-09-27 NOTE — ED Notes (Addendum)
Per EMS- pt comes from [place]. Noticed a golf ball sized mass to right side upper neck. Rhonchi to bilateral lung fields. Alert to normal to person. HX of dementia. Facility reports difficulty swallowing x several days. Daughter reports pulled pills out of his mouth.  BP 118/48, HR 80, 96% 2 L. CBG 102

## 2012-09-27 NOTE — H&P (Signed)
[facility] History and Physical  Kurt [name] ZOX:[id number] DOB: May 29, 1929 DOA: 09/27/2012  Referring physician: Dr.Plunket. PCP: Nadean Corwin, MD  Specialists: None.  Chief Complaint: Difficulty swallowing.  HPI: Kurt [name] is a 79 y.o. male who is recently admitted for seizures and at that time patient's seizure medications were changed to Keppra and patient was discharged to nursing home was found by patient's family that patient was finding it increasingly difficult to eat over the last one week. Last 24 hours patient has hardly eaten anything and also found it difficult to speak. Yesterday patient's family noticed increasing swelling in the submandibular area on the right side which is progressive worsened and at this time patient was brought to the ER. Labs in the ER show acute renal failure with metabolic acidosis with creatinine around 8 which was significantly higher than what it was last month of almost normal. CT of the soft tissue neck shows possibility of submandibular infection and ENT surgeon has been consulted. On arrival patient is also found to be mildly hypotensive. Patient has been admitted for further management. Patient at this time follows commands but provides minimal history. But denies any chest pain or shortness of breath and is able to make urine. Patient was found to have no fever chills.  Review of Systems: As presented in the history of presenting illness, rest negative.  Past Medical History  Diagnosis Date  . Hypertension   . Hyperlipidemia   . Seizure disorder   . CKD (chronic kidney disease), stage III   . Altered mental state 08/31/2012    "came into hospital a little disoriented; it's gotten worse; this is not normal" (08/31/2012)  . Seizures   . Type II diabetes mellitus   . Chronic lower back pain    Past Surgical History  Procedure Laterality Date  . Brain tumor excision    . 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 He has never used smokeless tobacco. He reports that he does not drink alcohol or use illicit drugs. Lives in a nursing home recently. where does patient live-- Not sure. Can patient participate in ADLs?  No Known Allergies  Family History  Problem Relation Age of Onset  . Hypertension Daughter       Prior to Admission medications   Medication Sig Start Date End Date Taking? Authorizing Provider  aspirin EC 81 MG tablet Take 81 mg by mouth daily.   Yes Historical Provider, MD  atenolol (TENORMIN) 100 MG tablet Take 1 tablet (100 mg total) by mouth daily. 08/31/12  Yes Marinda Elk, MD  Cholecalciferol (D-3-5) 5000 UNITS capsule Take 5,000 Units by mouth daily.   Yes Historical Provider, MD  finasteride (PROSCAR) 5 MG tablet Take 5 mg by mouth daily.   Yes Historical Provider, MD  levETIRAcetam (KEPPRA) 500 MG tablet Take 500 mg by mouth every 12 (twelve) hours.   Yes Historical Provider, MD  losartan-hydrochlorothiazide (HYZAAR) 100-12.5 MG per tablet Take 1 tablet by mouth daily.   Yes Historical Provider, MD  metFORMIN (GLUMETZA) 500 MG (MOD) 24 hr tablet Take 1,000 mg by mouth 2 (two) times daily with a meal.   Yes Historical Provider, MD  simvastatin (ZOCOR) 40 MG tablet Take 40 mg by mouth every evening.   Yes Historical Provider, MD   Physical Exam: Filed Vitals:   09/27/12 1622 09/27/12 1915 09/27/12 2033 09/27/12 2045  BP:  116/55 129/111 87/42  Pulse: 67  68    Temp: 97.8 F (36.6 C)  97.9 F (36.6 C)   TempSrc:   Oral   Resp: 14 19 13 14   Height: 6' 3.2" (1.91 m)     Weight: 99.7 kg (219 lb 12.8 oz)     SpO2:  100% 100%      General:  Well-developed well-nourished.  Eyes: Anicteric no pallor.  ENT: Swelling in the right submandibular area which is tender. Patient is able to protrude the tongue with difficulty.  Neck: Swelling right mandible area tender. No active discharge.  Cardiovascular: S1-S2  heard.  Respiratory: No rhonchi or crepitations.  Abdomen: Soft nontender bowel sounds present.  Skin: No rash.  Musculoskeletal: No edema.  Psychiatric: Alert awake oriented to his name and follows commands.  Neurologic: Motor all extremities.  Labs on Admission:  Basic Metabolic Panel:  Recent Labs Lab 09/27/12 1640  NA 148*  K 5.8*  CL 117*  CO2 14*  GLUCOSE 127*  BUN 205*  CREATININE 8.12*  CALCIUM 10.4   Liver Function Tests:  Recent Labs Lab 09/27/12 1640  AST 16  ALT 10  ALKPHOS 52  BILITOT 0.3  PROT 8.2  ALBUMIN 3.4*   No results found for this basename: LIPASE, AMYLASE,  in the last 168 hours No results found for this basename: AMMONIA,  in the last 168 hours CBC:  Recent Labs Lab 09/27/12 1640  WBC 15.7*  NEUTROABS 13.6*  HGB 12.0*  HCT 34.5*  MCV 87.8  PLT 98*   Cardiac Enzymes: No results found for this basename: CKTOTAL, CKMB, CKMBINDEX, TROPONINI,  in the last 168 hours  BNP (last 3 results) No results found for this basename: PROBNP,  in the last 8760 hours CBG: No results found for this basename: GLUCAP,  in the last 168 hours  Radiological Exams on Admission: Dg Chest 2 View  09/27/2012  *RADIOLOGY REPORT*  Clinical Data: altered mental status  CHEST - 2 VIEW  Comparison: 08/30/2012  Findings: Lungs remain clear.  Negative for heart failure or pneumonia.  No mass lesion.  Heart size is normal.  IMPRESSION: No acute abnormality.   Original Report Authenticated By: Janeece Riggers, M.D.    Ct Soft Tissue Neck Wo Contrast  09/27/2012  *RADIOLOGY REPORT*  Clinical Data: Palpable mass right neck.  Difficulty swallowing.  History of meningioma resection skull base.  CT NECK WITHOUT CONTRAST  Technique:  Multidetector CT imaging of the neck was performed without intravenous contrast.  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  Based on the prior MRI there  is residual meningioma present.  There is encephalomalacia in the inferior frontal lobes. There is obstruction of the sphenoid sinus which is filled with secretions, similar to prior imaging.  This is incompletely evaluated on the study of the neck.  Right submandibular gland is enlarged and markedly asymmetric when compared to the left.  The right gland measures approximately 2 x 3 cm.  Left gland is   atrophic measuring approximately 9 x 19 mm. The right submandibular gland is enlarged and there is some stranding in the surrounding fat and thickening of the right platysmus muscle.  Small submental nodes are present on the right. There is asymmetry of the floor the mouth on the right.  Without intravenous contrast, evaluation is limited.  No pathologically enlarged lymph nodes.  Thyroid is normal.  Lung apices are clear.  Parotid gland is normal bilaterally. Cervical disc  degeneration and spondylosis without acute bony abnormality. There is thickening and calcification of the transverse ligament of C1.  This is causing spinal stenosis.  IMPRESSION: Enlargement of the right submandibular gland with some stranding in the surrounding fat.  There is asymmetric soft tissue thickening of the floor the mouth on the right.  The patient's white blood count is elevated today and this may be due to acute infection.  Lack of intravenous contrast makes it difficult to evaluate for abscess. Neoplasm in the submandibular and also possible.  Given the elevated white count, treatment for infection is suggested.  If the palpable mass persists after treatment repeat CT neck with contrast is suggested to exclude a mass lesion.  Postop changes of meningioma resection with chronic sinusitis.   Original Report Authenticated By: Janeece Riggers, M.D.    US Renal  09/27/2012  *RADIOLOGY REPORT*  Clinical Data: Mass  RENAL/URINARY TRACT ULTRASOUND COMPLETE  Comparison:  Renal ultrasound 05/09/2009  Findings:  Right Kidney:  11.8 cm.  Limited  visualization of the right kidney due to overlying bowel gas.  Increased echogenicity of the cortex. Right upper pole cyst 2.5 x 3.0 cm.  Cyst in the left lower pole containing internal septations measuring 4.5 x 6.7 cm.  This is probably benign.  Negative for obstruction.  Left Kidney:  11.1 cm in length.  3 x 4 cm left upper pole cyst.  3 x 3 cm left upper pole cyst.  Bladder:  Empty bladder appear  IMPRESSION: Bilateral renal cysts.  No obstruction.   Original Report Authenticated By: Janeece Riggers, M.D.     EKG: Independently reviewed. Normal sinus rhythm with possible atrial arrhythmia.  Assessment/Plan Principal Problem:   ARF (acute renal failure) Active Problems:   Seizure disorder, with left leg weakness (Todds phenomenon)   Hypertension   Diabetes mellitus without complication   CKD (chronic kidney disease), stage III   Submandibular gland infection   Hypernatremia   1. Acute renal failure on chronic kidney disease -  possibly secondary to poor oral intake and dehydration, hypotension and on top of which patient is taking antihypertensives. At this time patient has already received 1 L bolus and I have ordered one more liter normal saline bolus. Closely follow intake output and metabolic panel. Renal sonogram does not show any obstruction. Discontinue Cozaar and HCTZ. Continue with bicarbonate drip. Patient and family is of the opinion that patient does not want to be on dialysis if kidney function were to further worsen. Check UA. 2. Submandibular infection - blood cultures have been ordered and patient has been empirically started vancomycin and Zosyn. Patient will be kept n.p.o. ENT consult has been requested with Dr. Pollyann Kennedy. 3. Metabolic acidosis from renal failure - patient has been started on bicarbonate drip. See #1. 4. History of seizures - Keppra has been changed to IV until patient can take orally. 5. Diabetes mellitus type 2 - closely follow CBGs with sliding-scale. Discontinue  metformin due to renal failure. 6. Hypertension presently hypotensive - old antihypertensives.  Patient and family at this time is of the opinion that patient does not want to be on dialysis and has requested not to be resuscitated in the event of cardiac arrest.    Code Status: DO NOT RESUSCITATE.  Family Communication: Patient's wife and daughters at the bedside.  Disposition Plan: Admit to inpatient.    Kitana Gage N. [facility] Pager [phone].  If 7PM-7AM, please contact night-coverage [email or web address] Password [facility] 09/27/2012, 9:05 PM

## 2012-09-27 NOTE — ED Provider Notes (Signed)
History     CSN: [id number]  Arrival date & time 09/27/12  1549   First MD Initiated Contact with Patient 09/27/12 1554      Chief Complaint  Patient presents with  . Mass    (Consider location/radiation/quality/duration/timing/severity/associated sxs/prior treatment) Patient is a 79 y.o. male presenting with altered mental status. The history is provided by the nursing home and a relative. The history is limited by the condition of the patient and the absence of a caregiver.  Altered Mental Status This is a new problem. Episode onset: apparently reported has been going down hill for last week. The problem occurs constantly. The problem has been gradually worsening. Associated symptoms comments: Report patient has not been eating or drinking for the last few days and has been confused. Also nursing home noted a mass in the right side of his upper neck. Nothing aggravates the symptoms. Nothing relieves the symptoms. He has tried nothing for the symptoms. The treatment provided no relief.    Past Medical History  Diagnosis Date  . Hypertension   . Hyperlipidemia   . Seizure disorder   . CKD (chronic kidney disease), stage III   . Altered mental state 08/31/2012    "came into hospital a little disoriented; it's gotten worse; this is not normal" (08/31/2012)  . Seizures   . Type II diabetes mellitus   . Chronic lower back pain     Past Surgical History  Procedure Laterality Date  . Brain tumor excision    . 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 Hypertension Daughter     History  Substance Use Topics  . Smoking status: Never Smoker   . Smokeless tobacco: Never Used  . Alcohol Use: No      Review of Systems  Unable to perform ROS Psychiatric/Behavioral: Positive for altered mental status.    Allergies  Review of patient's allergies indicates no known allergies.  Home Medications   Current Outpatient Rx  Name   Route  Sig  Dispense  Refill  . aspirin EC 81 MG tablet   Oral   Take 81 mg by mouth daily.         Marland Kitchen atenolol (TENORMIN) 100 MG tablet   Oral   Take 1 tablet (100 mg total) by mouth daily.   30 tablet   0   . Cholecalciferol (D-3-5) 5000 UNITS capsule   Oral   Take 5,000 Units by mouth daily.         . finasteride (PROSCAR) 5 MG tablet   Oral   Take 5 mg by mouth daily.         Marland Kitchen levETIRAcetam (KEPPRA) 500 MG tablet   Oral   Take 500 mg by mouth every 12 (twelve) hours.         Marland Kitchen losartan-hydrochlorothiazide (HYZAAR) 100-12.5 MG per tablet   Oral   Take 1 tablet by mouth daily.         . metFORMIN (GLUMETZA) 500 MG (MOD) 24 hr tablet   Oral   Take 1,000 mg by mouth 2 (two) times daily with a meal.         . simvastatin (ZOCOR) 40 MG tablet   Oral   Take 40 mg by mouth every evening.           Pulse 67  Temp(Src) 97.8 F (36.6 C)  Resp 14  Physical Exam  Nursing note and vitals reviewed. Constitutional:  He appears well-developed and well-nourished. No distress.  HENT:  Head: Normocephalic and atraumatic.  Mouth/Throat: Oropharynx is clear and moist.  Eyes: Conjunctivae and EOM are normal. Pupils are equal, round, and reactive to light.  Neck: Normal range of motion. Neck supple. No tracheal deviation present.  Firm tender mass in the right upper anterior neck without any warmth, erythema or fluctuance  Cardiovascular: Normal rate, regular rhythm and intact distal pulses.   No murmur heard. Pulmonary/Chest: Effort normal and breath sounds normal. No stridor. No respiratory distress. He has no wheezes. He has no rales.  Abdominal: Soft. He exhibits no distension. There is no tenderness. There is no rebound and no guarding.  Musculoskeletal: Normal range of motion. He exhibits no edema and no tenderness.  Mild contractures in the lower extremities  Neurological:  Patient will open his eyes briefly when name is called but is not answering questions   Skin: Skin is warm and dry. No rash noted. No erythema.  Psychiatric: He has a normal mood and affect. His behavior is normal.    ED Course  Procedures (including critical care time)  Labs Reviewed  CBC WITH DIFFERENTIAL - Abnormal; Notable for the following:    WBC 15.7 (*)    RBC 3.93 (*)    Hemoglobin 12.0 (*)    HCT 34.5 (*)    Platelets 98 (*)    Neutrophils Relative 87 (*)    Neutro Abs 13.6 (*)    Lymphocytes Relative 8 (*)    All other components within normal limits  COMPREHENSIVE METABOLIC PANEL - Abnormal; Notable for the following:    Sodium 148 (*)    Potassium 5.8 (*)    Chloride 117 (*)    CO2 14 (*)    Glucose, Bld 127 (*)    BUN 205 (*)    Creatinine, Ser 8.12 (*)    Albumin 3.4 (*)    GFR calc non Af Amer 5 (*)    GFR calc Af Amer 6 (*)    All other components within normal limits  URINALYSIS, ROUTINE W REFLEX MICROSCOPIC  POCT I-STAT TROPONIN I   Dg Chest 2 View  09/27/2012  *RADIOLOGY REPORT*  Clinical Data: altered mental status  CHEST - 2 VIEW  Comparison: 08/30/2012  Findings: Lungs remain clear.  Negative for heart failure or pneumonia.  No mass lesion.  Heart size is normal.  IMPRESSION: No acute abnormality.   Original Report Authenticated By: Janeece Riggers, M.D.    Ct Soft Tissue Neck Wo Contrast  09/27/2012  *RADIOLOGY REPORT*  Clinical Data: Palpable mass right neck.  Difficulty swallowing.  History of meningioma resection skull base.  CT NECK WITHOUT CONTRAST  Technique:  Multidetector CT imaging of the neck was performed without intravenous contrast.  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  Based on the prior MRI there is residual meningioma present.  There is encephalomalacia in the inferior frontal lobes. There is obstruction of the sphenoid sinus which is filled with secretions, similar to prior imaging.  This is incompletely evaluated on the study of the  neck.  Right submandibular gland is enlarged and markedly asymmetric when compared to the left.  The right gland measures approximately 2 x 3 cm.  Left gland is   atrophic measuring approximately 9 x 19 mm. The right submandibular gland is enlarged and there is some stranding in the surrounding fat and thickening of the right platysmus muscle.  Small submental nodes are present on the right. There is asymmetry of the floor the mouth on the right.  Without intravenous contrast, evaluation is limited.  No pathologically enlarged lymph nodes.  Thyroid is normal.  Lung apices are clear.  Parotid gland is normal bilaterally. Cervical disc degeneration and spondylosis without acute bony abnormality. There is thickening and calcification of the transverse ligament of C1.  This is causing spinal stenosis.  IMPRESSION: Enlargement of the right submandibular gland with some stranding in the surrounding fat.  There is asymmetric soft tissue thickening of the floor the mouth on the right.  The patient's white blood count is elevated today and this may be due to acute infection.  Lack of intravenous contrast makes it difficult to evaluate for abscess. Neoplasm in the submandibular and also possible.  Given the elevated white count, treatment for infection is suggested.  If the palpable mass persists after treatment repeat CT neck with contrast is suggested to exclude a mass lesion.  Postop changes of meningioma resection with chronic sinusitis.   Original Report Authenticated By: Janeece Riggers, M.D.      Date: 09/27/2012  Rate: 68  Rhythm: normal sinus rhythm  QRS Axis: normal  Intervals: normal  ST/T Wave abnormalities: nonspecific ST changes  Conduction Disutrbances:artifact present in all leads  Narrative Interpretation:   Old EKG Reviewed: unchanged  CRITICAL CARE Performed by: Gwyneth Sprout   Total critical care time: 30  Critical care time was exclusive of separately billable procedures and treating  other patients.  Critical care was necessary to treat or prevent imminent or life-threatening deterioration.  Critical care was time spent personally by me on the following activities: development of treatment plan with patient and/or surrogate as well as nursing, discussions with consultants, evaluation of patient's response to treatment, examination of patient, obtaining history from patient or surrogate, ordering and performing treatments and interventions, ordering and review of laboratory studies, ordering and review of radiographic studies, pulse oximetry and re-evaluation of patient's condition.   1. Submandibular gland infection   2. Renal failure (ARF), acute on chronic   3. Dehydration       MDM   Patient coming from rehabilitation at [facility] due to her worsening mental status and swelling of the right neck. Decreased by mouth intake. Patient will open eyes when his name is called however is unable answer questions. It is unclear if this is different from his baseline. The patient does have tender swollen area in his right anterior neck concerning for possible parotitis, other infectious etiology or mass. He has no respiratory distress or stridor.  CBC, CMP, UA, chest x-ray pending to evaluate for infectious etiology as the cause of his altered mental status. CT of the neck also ordered to further evaluate however unable to use contrast due to chronic kidney disease and creatinine stays around 1.9-2.  7:20 PM Patient with acute renal failure with a creatinine of 8 and had BUN of 205. Bicarbonate is 14 and patient is acidotic this renal failure may be due to dehydration due to an IV in 10 to submandibular swelling and less likely infection. Patient started on Zosyn and cefepime for suspected submandibular infection and ENT will be consulted.  Discussed patient with hospitalist to admit the patient for further care. Currently potassium is only 5.8 and he has no EKG  changes.      Gwyneth Sprout, MD 09/27/12 Ernestina [place]

## 2012-09-28 DIAGNOSIS — R1310 Dysphagia, unspecified: Secondary | ICD-10-CM | POA: Diagnosis present

## 2012-09-28 DIAGNOSIS — E860 Dehydration: Secondary | ICD-10-CM | POA: Diagnosis present

## 2012-09-28 LAB — COMPREHENSIVE METABOLIC PANEL
Alkaline Phosphatase: 45 U/L (ref 39–117)
BUN: 189 mg/dL — ABNORMAL HIGH (ref 6–23)
CO2: 18 mEq/L — ABNORMAL LOW (ref 19–32)
Chloride: 120 mEq/L — ABNORMAL HIGH (ref 96–112)
Creatinine, Ser: 6.94 mg/dL — ABNORMAL HIGH (ref 0.50–1.35)
GFR calc non Af Amer: 7 mL/min — ABNORMAL LOW (ref >90)
Potassium: 4.5 mEq/L (ref 3.5–5.1)
Total Bilirubin: 0.3 mg/dL (ref 0.3–1.2)

## 2012-09-28 LAB — CBC WITH DIFFERENTIAL/PLATELET
Basophils Absolute: 0 10*3/uL (ref 0.0–0.1)
Basophils Relative: 0 % (ref 0–1)
Eosinophils Relative: 0 % (ref 0–5)
Lymphocytes Relative: 8 % — ABNORMAL LOW (ref 12–46)
MCHC: 35.5 g/dL (ref 30.0–36.0)
MCV: 86 fL (ref 78.0–100.0)
Monocytes Absolute: 0.7 10*3/uL (ref 0.1–1.0)
Neutro Abs: 10.9 10*3/uL — ABNORMAL HIGH (ref 1.7–7.7)
Platelets: 84 10*3/uL — ABNORMAL LOW (ref 150–400)
RDW: 14.2 % (ref 11.5–15.5)
WBC: 12.7 10*3/uL — ABNORMAL HIGH (ref 4.0–10.5)

## 2012-09-28 LAB — URINALYSIS, ROUTINE W REFLEX MICROSCOPIC
Glucose, UA: NEGATIVE mg/dL
Ketones, ur: NEGATIVE mg/dL
Leukocytes, UA: NEGATIVE
Nitrite: NEGATIVE
Specific Gravity, Urine: 1.018 (ref 1.005–1.030)
pH: 5 (ref 5.0–8.0)

## 2012-09-28 LAB — GLUCOSE, CAPILLARY: Glucose-Capillary: 131 mg/dL — ABNORMAL HIGH (ref 70–99)

## 2012-09-28 LAB — URINE MICROSCOPIC-ADD ON

## 2012-09-28 LAB — CREATININE, URINE, RANDOM: Creatinine, Urine: 84.59 mg/dL

## 2012-09-28 LAB — IRON AND TIBC: Saturation Ratios: 16 % — ABNORMAL LOW (ref 20–55)

## 2012-09-28 LAB — PHOSPHORUS: Phosphorus: 6.6 mg/dL — ABNORMAL HIGH (ref 2.3–4.6)

## 2012-09-28 LAB — MRSA PCR SCREENING: MRSA by PCR: POSITIVE — AB

## 2012-09-28 MED ORDER — DEXTROSE 5 % IV SOLN: INTRAVENOUS | Status: DC

## 2012-09-28 MED ORDER — SODIUM BICARBONATE 8.4 % IV SOLN
INTRAVENOUS | Status: DC
  Administered 2012-09-28: 09:00:00 via INTRAVENOUS
  Filled 2012-09-28 (×2): qty 150

## 2012-09-28 MED ORDER — SODIUM BICARBONATE 650 MG PO TABS
650.0000 mg | ORAL_TABLET | Freq: Three times a day (TID) | ORAL | Status: DC
  Filled 2012-09-28 (×3): qty 1

## 2012-09-28 MED ORDER — INSULIN ASPART 100 UNIT/ML ~~LOC~~ SOLN
0.0000 [IU] | SUBCUTANEOUS | Status: DC
  Administered 2012-09-28: 3 [IU] via SUBCUTANEOUS
  Administered 2012-09-29: 2 [IU] via SUBCUTANEOUS
  Administered 2012-09-29 (×3): 3 [IU] via SUBCUTANEOUS
  Administered 2012-09-29: 2 [IU] via SUBCUTANEOUS

## 2012-09-28 MED ORDER — SODIUM BICARBONATE 8.4 % IV SOLN
INTRAVENOUS | Status: DC
  Administered 2012-09-28 (×2): via INTRAVENOUS
  Filled 2012-09-28 (×5): qty 100

## 2012-09-28 NOTE — Consult Note (Signed)
Reason for Consult:AKI/CKD Referring Physician: Dr. Skipper Cliche is an 79 y.o. male.  HPI: 79 yr old male with hx HTN, DM (? Duration of each), menigioma resx, sz disorder (recent hosp), and CKD 3 with Cr 1.7-2, ? Etio.   Lives in [place].  Poor intake for several days, and swelling in neck.  He cannot give hx of voiding sx, V,D itching, cramping , etc.  Cr 8.1/BUN 205 yest and 6.9/150 today.  Review of systems not obtained due to patient factors.  Confusion and same answer for multiple questions   Past Medical History  Diagnosis Date  . Hypertension   . Hyperlipidemia   . Seizure disorder   . CKD (chronic kidney disease), stage III   . Altered mental state 08/31/2012    "came into hospital a little disoriented; it's gotten worse; this is not normal" (08/31/2012)  . Seizures   . Type II diabetes mellitus   . Chronic lower back pain     Past Surgical History  Procedure Laterality Date  . Brain tumor excision    . 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 Hypertension Daughter     Social History:  reports that he has never smoked. He has never used smokeless tobacco. He reports that he does not drink alcohol or use illicit drugs.  Allergies: No Known Allergies  Medications: I have reviewed the patient's current medications.  Results for orders placed during the hospital encounter of 09/27/12 (from the past 48 hour(s))  POCT I-STAT TROPONIN I     Status: None   Collection Time    09/27/12  4:38 PM      Result Value Range   Troponin i, poc 0.01  0.00 - 0.08 ng/mL   Comment 3            Comment: Due to the release kinetics of cTnI,     a negative result within the first hours     of the onset of symptoms does not rule out     myocardial infarction with certainty.     If myocardial infarction is still suspected,     repeat the test at appropriate intervals.  CBC WITH DIFFERENTIAL     Status: Abnormal   Collection Time     09/27/12  4:40 PM      Result Value Range   WBC 15.7 (*) 4.0 - 10.5 K/uL   RBC 3.93 (*) 4.22 - 5.81 MIL/uL   Hemoglobin 12.0 (*) 13.0 - 17.0 g/dL   HCT 16.1 (*) 09.6 - 04.5 %   MCV 87.8  78.0 - 100.0 fL   MCH 30.5  26.0 - 34.0 pg   MCHC 34.8  30.0 - 36.0 g/dL   RDW 40.9  81.1 - 91.4 %   Platelets 98 (*) 150 - 400 K/uL   Comment: LARGE PLATELETS PRESENT     PLATELETS APPEAR DECREASED     REPEATED TO VERIFY     PLATELET COUNT CONFIRMED BY SMEAR   Neutrophils Relative 87 (*) 43 - 77 %   Neutro Abs 13.6 (*) 1.7 - 7.7 K/uL   Lymphocytes Relative 8 (*) 12 - 46 %   Lymphs Abs 1.3  0.7 - 4.0 K/uL   Monocytes Relative 5  3 - 12 %   Monocytes Absolute 0.8  0.1 - 1.0 K/uL   Eosinophils Relative 0  0 - 5 %   Eosinophils Absolute 0.0  0.0 - 0.7 K/uL   Basophils Relative 0  0 - 1 %   Basophils Absolute 0.0  0.0 - 0.1 K/uL  COMPREHENSIVE METABOLIC PANEL     Status: Abnormal   Collection Time    09/27/12  4:40 PM      Result Value Range   Sodium 148 (*) 135 - 145 mEq/L   Potassium 5.8 (*) 3.5 - 5.1 mEq/L   Comment: HEMOLYSIS AT THIS LEVEL MAY AFFECT RESULT   Chloride 117 (*) 96 - 112 mEq/L   CO2 14 (*) 19 - 32 mEq/L   Glucose, Bld 127 (*) 70 - 99 mg/dL   BUN 161 (*) 6 - 23 mg/dL   Creatinine, Ser 0.96 (*) 0.50 - 1.35 mg/dL   Calcium 04.5  8.4 - 40.9 mg/dL   Total Protein 8.2  6.0 - 8.3 g/dL   Albumin 3.4 (*) 3.5 - 5.2 g/dL   AST 16  0 - 37 U/L   ALT 10  0 - 53 U/L   Alkaline Phosphatase 52  39 - 117 U/L   Total Bilirubin 0.3  0.3 - 1.2 mg/dL   GFR calc non Af Amer 5 (*) >90 mL/min   GFR calc Af Amer 6 (*) >90 mL/min   Comment:            The eGFR has been calculated     using the CKD EPI equation.     This calculation has not been     validated in all clinical     situations.     eGFR's persistently     <90 mL/min signify     possible Chronic Kidney Disease.  POCT I-STAT 3, BLOOD GAS (G3+)     Status: Abnormal   Collection Time    09/27/12  8:30 PM      Result Value Range    pH, Arterial 7.259 (*) 7.350 - 7.450   pCO2 arterial 26.7 (*) 35.0 - 45.0 mmHg   pO2, Arterial 148.0 (*) 80.0 - 100.0 mmHg   Bicarbonate 12.0 (*) 20.0 - 24.0 mEq/L   TCO2 13  0 - 100 mmol/L   O2 Saturation 99.0     Acid-base deficit 14.0 (*) 0.0 - 2.0 mmol/L   Patient temperature 97.8 F     Collection site RADIAL, ALLEN'S TEST ACCEPTABLE     Drawn by Operator     Sample type ARTERIAL    URINALYSIS, ROUTINE W REFLEX MICROSCOPIC     Status: Abnormal   Collection Time    09/27/12  9:03 PM      Result Value Range   Color, Urine YELLOW  YELLOW   APPearance CLOUDY (*) CLEAR   Specific Gravity, Urine 1.018  1.005 - 1.030   pH 5.0  5.0 - 8.0   Glucose, UA NEGATIVE  NEGATIVE mg/dL   Hgb urine dipstick TRACE (*) NEGATIVE   Bilirubin Urine NEGATIVE  NEGATIVE   Ketones, ur NEGATIVE  NEGATIVE mg/dL   Protein, ur NEGATIVE  NEGATIVE mg/dL   Urobilinogen, UA 0.2  0.0 - 1.0 mg/dL   Nitrite NEGATIVE  NEGATIVE   Leukocytes, UA NEGATIVE  NEGATIVE  URINE MICROSCOPIC-ADD ON     Status: Abnormal   Collection Time    09/27/12  9:03 PM      Result Value Range   RBC / HPF 0-2  <3 RBC/hpf   Bacteria, UA FEW (*) RARE   Urine-Other AMORPHOUS URATES/PHOSPHATES    GLUCOSE, CAPILLARY     Status: Abnormal  Collection Time    09/28/12 12:10 AM      Result Value Range   Glucose-Capillary 121 (*) 70 - 99 mg/dL  GLUCOSE, CAPILLARY     Status: Abnormal   Collection Time    09/28/12  3:13 AM      Result Value Range   Glucose-Capillary 113 (*) 70 - 99 mg/dL  COMPREHENSIVE METABOLIC PANEL     Status: Abnormal   Collection Time    09/28/12  5:40 AM      Result Value Range   Sodium 153 (*) 135 - 145 mEq/L   Potassium 4.5  3.5 - 5.1 mEq/L   Comment: DELTA CHECK NOTED   Chloride 120 (*) 96 - 112 mEq/L   CO2 18 (*) 19 - 32 mEq/L   Glucose, Bld 131 (*) 70 - 99 mg/dL   BUN 952 (*) 6 - 23 mg/dL   Creatinine, Ser 8.41 (*) 0.50 - 1.35 mg/dL   Calcium 9.6  8.4 - 32.4 mg/dL   Total Protein 6.9  6.0 - 8.3 g/dL    Albumin 2.8 (*) 3.5 - 5.2 g/dL   AST 11  0 - 37 U/L   ALT 7  0 - 53 U/L   Alkaline Phosphatase 45  39 - 117 U/L   Total Bilirubin 0.3  0.3 - 1.2 mg/dL   GFR calc non Af Amer 7 (*) >90 mL/min   GFR calc Af Amer 8 (*) >90 mL/min   Comment:            The eGFR has been calculated     using the CKD EPI equation.     This calculation has not been     validated in all clinical     situations.     eGFR's persistently     <90 mL/min signify     possible Chronic Kidney Disease.  PHOSPHORUS     Status: Abnormal   Collection Time    09/28/12  5:40 AM      Result Value Range   Phosphorus 6.6 (*) 2.3 - 4.6 mg/dL  CBC WITH DIFFERENTIAL     Status: Abnormal   Collection Time    09/28/12  5:40 AM      Result Value Range   WBC 12.7 (*) 4.0 - 10.5 K/uL   RBC 3.28 (*) 4.22 - 5.81 MIL/uL   Hemoglobin 10.0 (*) 13.0 - 17.0 g/dL   HCT 40.1 (*) 02.7 - 25.3 %   MCV 86.0  78.0 - 100.0 fL   MCH 30.5  26.0 - 34.0 pg   MCHC 35.5  30.0 - 36.0 g/dL   RDW 66.4  40.3 - 47.4 %   Platelets 84 (*) 150 - 400 K/uL   Comment: CONSISTENT WITH PREVIOUS RESULT   Neutrophils Relative 86 (*) 43 - 77 %   Neutro Abs 10.9 (*) 1.7 - 7.7 K/uL   Lymphocytes Relative 8 (*) 12 - 46 %   Lymphs Abs 1.0  0.7 - 4.0 K/uL   Monocytes Relative 6  3 - 12 %   Monocytes Absolute 0.7  0.1 - 1.0 K/uL   Eosinophils Relative 0  0 - 5 %   Eosinophils Absolute 0.0  0.0 - 0.7 K/uL   Basophils Relative 0  0 - 1 %   Basophils Absolute 0.0  0.0 - 0.1 K/uL    Dg Chest 2 View  09/27/2012  *RADIOLOGY REPORT*  Clinical Data: altered mental status  CHEST - 2 VIEW  Comparison: 08/30/2012  Findings: Lungs remain clear.  Negative for heart failure or pneumonia.  No mass lesion.  Heart size is normal.  IMPRESSION: No acute abnormality.   Original Report Authenticated By: Janeece Riggers, M.D.    Ct Soft Tissue Neck Wo Contrast  09/27/2012  *RADIOLOGY REPORT*  Clinical Data: Palpable mass right neck.  Difficulty swallowing.  History of meningioma  resection skull base.  CT NECK WITHOUT CONTRAST  Technique:  Multidetector CT imaging of the neck was performed without intravenous contrast.  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  Based on the prior MRI there is residual meningioma present.  There is encephalomalacia in the inferior frontal lobes. There is obstruction of the sphenoid sinus which is filled with secretions, similar to prior imaging.  This is incompletely evaluated on the study of the neck.  Right submandibular gland is enlarged and markedly asymmetric when compared to the left.  The right gland measures approximately 2 x 3 cm.  Left gland is   atrophic measuring approximately 9 x 19 mm. The right submandibular gland is enlarged and there is some stranding in the surrounding fat and thickening of the right platysmus muscle.  Small submental nodes are present on the right. There is asymmetry of the floor the mouth on the right.  Without intravenous contrast, evaluation is limited.  No pathologically enlarged lymph nodes.  Thyroid is normal.  Lung apices are clear.  Parotid gland is normal bilaterally. Cervical disc degeneration and spondylosis without acute bony abnormality. There is thickening and calcification of the transverse ligament of C1.  This is causing spinal stenosis.  IMPRESSION: Enlargement of the right submandibular gland with some stranding in the surrounding fat.  There is asymmetric soft tissue thickening of the floor the mouth on the right.  The patient's white blood count is elevated today and this may be due to acute infection.  Lack of intravenous contrast makes it difficult to evaluate for abscess. Neoplasm in the submandibular and also possible.  Given the elevated white count, treatment for infection is suggested.  If the palpable mass persists after treatment repeat CT neck with contrast is suggested to exclude a mass lesion.  Postop  changes of meningioma resection with chronic sinusitis.   Original Report Authenticated By: Janeece Riggers, M.D.    US Renal  09/27/2012  *RADIOLOGY REPORT*  Clinical Data: Mass  RENAL/URINARY TRACT ULTRASOUND COMPLETE  Comparison:  Renal ultrasound 05/09/2009  Findings:  Right Kidney:  11.8 cm.  Limited visualization of the right kidney due to overlying bowel gas.  Increased echogenicity of the cortex. Right upper pole cyst 2.5 x 3.0 cm.  Cyst in the left lower pole containing internal septations measuring 4.5 x 6.7 cm.  This is probably benign.  Negative for obstruction.  Left Kidney:  11.1 cm in length.  3 x 4 cm left upper pole cyst.  3 x 3 cm left upper pole cyst.  Bladder:  Empty bladder appear  IMPRESSION: Bilateral renal cysts.  No obstruction.   Original Report Authenticated By: Janeece Riggers, M.D.     @ROS @ Blood pressure 111/53, pulse 69, temperature 97.8 F (36.6 C), temperature source Oral, resp. rate 14, height 6' 3.2" (1.91 m), weight 99.8 kg (220 lb 0.3 oz), SpO2 95.00%. @PHYSEXAMBYAGE2 @ Physical Examination: General appearance - uncooperative and slowed mentation but attempting Mental status - confused, inappropriate safety awareness Eyes - not cooperative to exam of fundus but eoms appear intact Mouth -  mucous membranes moist, pharynx normal without lesions Neck - fullness under R side mandible more than L Lymphatics - moderate tender anterior cervical nodes, PCL Chest - rhonchi noted bilat, , gurgling, decreased bs Heart - S1 and S2 normal, systolic murmur Gr2/6 at 2nd left intercostal space Abdomen - soft, nontender, nondistended, no masses or organomegaly Extremities - no pedal edema noted, decreased DP Skin - dry , trophic changes distal  Assessment/Plan: 1 AKI most likely hemodynamic with Losartan and vol depletion (blocking autoregulation). Acidemic from Metformen and AKI.  Vol low, slowly replete. Need to avoid ^ S Na, and not over correct acidosis.  May not correct but  conservative mgmt indic.  Will recheck Urine sediment and make sure no BOO 2 DM controll 3 Hypertension: not an issue now, avoid ACEI and ARBS in him from now on 5. CKD 3 suspect nephrosclerosis.  Will check complic and address 6 Sz disorder 7 Submandib swelling on AB P IV with hypotonic fluids, foley, check Fe, PTH, UA, U Na/Cr Eliott Amparan L 09/28/2012, 11:43 AM

## 2012-09-28 NOTE — Progress Notes (Signed)
Utilization review completed.  

## 2012-09-28 NOTE — Evaluation (Signed)
Clinical/Bedside Swallow Evaluation Patient Details  Name: Kurt [name] MRN: [id number] Date of Birth: 04-03-1929  Today's Date: 09/28/2012 Time: 0900-0925 SLP Time Calculation (min): 25 min  Past Medical History:  Past Medical History  Diagnosis Date  . Hypertension   . Hyperlipidemia   . Seizure disorder   . CKD (chronic kidney disease), stage III   . Altered mental state 08/31/2012    "came into hospital a little disoriented; it's gotten worse; this is not normal" (08/31/2012)  . Seizures   . Type II diabetes mellitus   . Chronic lower back pain    Past Surgical History:  Past Surgical History  Procedure Laterality Date  . Brain tumor excision    . 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 male  with acute renal failure and right neck swelling. Pt c/o difficulty swallowing for past week. CT neck without contrast showed hypotrophic left 9mm submandibular gland and right homogeneous 2x3 cm submandibular gland without obvious mass or abscess and surrounding soft tissue stranding likely submandibular sialoadenitis. Pt started on antibiotics for treatment of mass.   Assessment / Plan / Recommendation Clinical Impression  Pt with severe wet vocal quality at baseline with inability to clear secretions, indicative of aspiration, despite max SLP verbal and visual cues and suctioning attempts. Pt recently dx with submandibular mass which has impacted swallow function negatively. SLP provided oral care in attempts to faciltiate oral phase and/or aid in secretion management, which was ineffective. Pt observed with prolonged oral phase/bolus holding with suspected delay in swallow initiation with immediate wet throat clear after trials of ice chips. SLP recommends NPO at this time due to pt's inablity to manage secretions and baseline wet vocal quality, will continue to f/u at bedside to assess pt's swallow function/response to antibiotics for possible diet  advancement.    Aspiration Risk  Severe    Diet Recommendation NPO   Medication Administration: Via alternative means    Other  Recommendations Oral Care Recommendations: Oral care QID Other Recommendations: Have oral suction available   Follow Up Recommendations  Other (comment) (Will reassess at next date for po readiness)    Frequency and Duration min 2x/week  2 weeks   Pertinent Vitals/Pain None reported    SLP Swallow Goals Goal #3: Pt will participate in therapeutic po trials with increased secretion management  (coughing, throat clear) with mod verbal cues  Swallow Study Goal #3 - Progress:  (new goal)   Swallow Study Prior Functional Status       General Date of Onset: 09/27/12 HPI: Kurt [name] is an 79 y.o. male  with acute renal failure and right neck swelling. Pt c/o difficulty swallowing for past week. CT neck without contrast showed hypotrophic left 9mm submandibular gland and right homogeneous 2x3 cm submandibular gland without obvious mass or abscess and surrounding soft tissue stranding likely submandibular sialoadenitis. Pt started on antibiotics for treatment of mass. Type of Study: Bedside swallow evaluation Previous Swallow Assessment: none found Diet Prior to this Study: NPO Temperature Spikes Noted: No Respiratory Status: Room air History of Recent Intubation: No Behavior/Cognition: Cooperative;Pleasant mood;Alert;Requires cueing Oral Cavity - Dentition: Edentulous Self-Feeding Abilities: Needs assist Patient Positioning: Upright in bed Baseline Vocal Quality: Wet;Low vocal intensity (gurgly) Volitional Cough: Cognitively unable to elicit Volitional Swallow: Able to elicit    Oral/Motor/Sensory Function Overall Oral Motor/Sensory Function: Impaired Labial ROM: Within Functional Limits Labial Symmetry: Within Functional Limits Labial Strength: Within Functional Limits Lingual ROM:  Reduced right;Reduced left Lingual Symmetry: Within Functional  Limits Lingual Strength: Reduced Facial ROM: Within Functional Limits Facial Symmetry: Within Functional Limits Facial Strength: Within Functional Limits Facial Sensation: Within Functional Limits Velum: Within Functional Limits   Ice Chips Ice chips: Impaired Presentation: Spoon Oral Phase Functional Implications: Prolonged oral transit Pharyngeal Phase Impairments: Wet Vocal Quality;Suspected delayed Swallow;Throat Clearing - Immediate;Other (comments) (Pt with very gurgly vocal quality at baseline)   Thin Liquid Thin Liquid: Not tested    Nectar Thick Nectar Thick Liquid: Not tested   Honey Thick Honey Thick Liquid: Not tested   Puree Puree: Not tested   Solid   Kurt   Kurt [name] SLP student  Solid: Not tested       Kurt [name] 09/28/2012,11:18 AM

## 2012-09-28 NOTE — Progress Notes (Signed)
[facility] Progress Note [place] TEAM 1 - Stepdown/ICU TEAM   Kurt [name] ZOX:[id number] DOB: Feb 09, 1929 DOA: 09/27/2012 PCP: Nadean Corwin, MD  Brief narrative: 79 year old male patient was recently admitted in late March for seizure disorder exacerbation. Was changed to Keppra and discharged to nursing facility. Per the patient's family he was having increasing difficulty eating over the past week. Over the past 24 hours prior to admission he was unable to eat and was having difficulty speaking. In addition to these symptoms the patient also noticed increased swelling in the submandibular area on the right side that has progressively worsened over 24 hours which prompted the family to bring the patient to the emergency department. In the ER patient was found to have acute renal failure with metabolic acidosis and creatinine greater than 8 which is much higher than his baseline of 1.85 back in March. CT of the neck soft tissue views demonstrated possible submandibular infection. ENT physician was consulted. When initially evaluated by the admitting physician the patient was found to be mildly hypotensive and appeared quite dehydrated. No other symptoms endorsed by the patient.  Assessment/Plan: Active Problems:   ARF (acute renal failure) on CKD (chronic kidney disease), stage III -baseline Scr 1.85 -current Scr has trended down to 6.94 after hydration so will continue hydration -acidosis has improved but not resolved so will cont bicarb gtt -suspect ATN due to preadmit Metformin and ARB plus hypotension due to anti-HTN meds and diuretics -Renal consulted for input -hyperkalemia has resolved    Acute Submandibular sialoadenitis -appreciate ENT assistance -cont IV steroids and empiric anbx's -cont supportive care -ENT ordered FNA but radiology notes need to wait for anbx's treatment to complete    Dysphagia -due to neck swelling -ST rec NPO for now   Hypernatremia/Dehydration -change IVF to D5W -follow lytes    Hypertension -controlled and actually low/soft -cont to hold tenormin, losartan/HCTZ    Seizure disorder, with left leg weakness (Todds phenomenon) -Cont IV Keppra    Diabetes mellitus without complication -hold Metformin and would not resume after dc -cont SSI -watch for increases in CBG with steroids   DVT prophylaxis: SCDs Code Status: DO NOT RESUSCITATE Family Communication: Patient Disposition Plan: Stepdown Isolation: None  Consultants: Nephrology  Procedures: None  Antibiotics: Clindamycin 4/22 >>> 4/22 Zosyn 4/22 >>> Vancomycin 4/22 >>>  HPI/Subjective: Patient awake and attempts to verbally communicate but appears to be having pain with trying to phonate and swallow.   Objective: Blood pressure 111/53, pulse 69, temperature 97.8 F (36.6 C), temperature source Oral, resp. rate 14, height 6' 3.2" (1.91 m), weight 99.8 kg (220 lb 0.3 oz), SpO2 95.00%.  Intake/Output Summary (Last 24 hours) at 09/28/12 1125 Last data filed at 09/28/12 1026  Gross per 24 hour  Intake    750 ml  Output    552 ml  Net    198 ml     Exam: General: No acute respiratory distress ENT: R neck swollen with boggy/tender mass, oral mucous membranes very dry Lungs: Clear to auscultation bilaterally without wheezes or crackles, RA Cardiovascular: Regular rate and rhythm without murmur gallop or rub normal S1 and S2, no peripheral edema or JVD Abdomen: Nontender, nondistended, soft, bowel sounds positive, no rebound, no ascites, no appreciable mass Musculoskeletal: No significant cyanosis, clubbing of bilateral lower extremities Neurological: Alert and oriented x 1-difficult to clarify since having pain with speech, moves all extremities x 4 without focal neurological deficits, CN 2-13 intact  Data Reviewed: Basic Metabolic Panel:  Recent Labs Lab 09/27/12 1640 09/28/12 0540  NA 148* 153*  K 5.8* 4.5  CL 117*  120*  CO2 14* 18*  GLUCOSE 127* 131*  BUN 205* 189*  CREATININE 8.12* 6.94*  CALCIUM 10.4 9.6  PHOS  --  6.6*   Liver Function Tests:  Recent Labs Lab 09/27/12 1640 09/28/12 0540  AST 16 11  ALT 10 7  ALKPHOS 52 45  BILITOT 0.3 0.3  PROT 8.2 6.9  ALBUMIN 3.4* 2.8*   No results found for this basename: LIPASE, AMYLASE,  in the last 168 hours No results found for this basename: AMMONIA,  in the last 168 hours CBC:  Recent Labs Lab 09/27/12 1640 09/28/12 0540  WBC 15.7* 12.7*  NEUTROABS 13.6* 10.9*  HGB 12.0* 10.0*  HCT 34.5* 28.2*  MCV 87.8 86.0  PLT 98* 84*   Cardiac Enzymes: No results found for this basename: CKTOTAL, CKMB, CKMBINDEX, TROPONINI,  in the last 168 hours BNP (last 3 results) No results found for this basename: PROBNP,  in the last 8760 hours CBG:  Recent Labs Lab 09/28/12 0010 09/28/12 0313  GLUCAP 121* 113*    No results found for this or any previous visit (from the past 240 hour(s)).   Studies:  Recent x-ray studies have been reviewed in detail by the Attending Physician  Scheduled Meds:  Reviewed in detail by the Attending Physician   Junious Silk, ANP [facility] Office  [phone] Pager [phone]  On-Call/Text Page:      [email or web address]      password [email or web address]  If 7PM-7AM, please contact night-coverage [email or web address] Password [email or web address] 09/28/2012, 11:25 AM   LOS: 1 day

## 2012-09-28 NOTE — Progress Notes (Signed)
Patient ID: Kurt [name], male   DOB: [birth year], 79 y.o.   MRN: [id number]   Request made for biopsy of Rt neck mass  Discussed with Dr Grace Isaac who has reviewed imaging Per CT recommendation of 4/21 We will need to wait for antibiotic treatment to determine if responds to treatment; monitor wbc; etc....  Please re order in 7-10 days if sxs persist or change

## 2012-09-28 NOTE — Progress Notes (Signed)
  I have directly reviewed the clinical findings, lab, imaging studies and management of this patient in detail. I have interviewed and examined the patient and agree with the documentation,  as recorded by the Physician extender.  Leroy Sea M.D on 09/28/2012 at 1:05 PM  [facility] Office  [phone]

## 2012-09-28 NOTE — Progress Notes (Signed)
Gave 3 units of novolog insulin for blood sugar of 223.

## 2012-09-28 NOTE — Evaluation (Signed)
SLP reviewed and agree with student findings.   Gwen Sarvis MA, CCC-SLP [phone]    

## 2012-09-29 LAB — COMPREHENSIVE METABOLIC PANEL WITH GFR
ALT: 6 U/L (ref 0–53)
AST: 10 U/L (ref 0–37)
Albumin: 2.8 g/dL — ABNORMAL LOW (ref 3.5–5.2)
Alkaline Phosphatase: 43 U/L (ref 39–117)
BUN: 153 mg/dL — ABNORMAL HIGH (ref 6–23)
CO2: 26 meq/L (ref 19–32)
Calcium: 9.9 mg/dL (ref 8.4–10.5)
Chloride: 122 meq/L — ABNORMAL HIGH (ref 96–112)
Creatinine, Ser: 4.82 mg/dL — ABNORMAL HIGH (ref 0.50–1.35)
GFR calc Af Amer: 12 mL/min — ABNORMAL LOW
GFR calc non Af Amer: 10 mL/min — ABNORMAL LOW
Glucose, Bld: 233 mg/dL — ABNORMAL HIGH (ref 70–99)
Potassium: 3.5 meq/L (ref 3.5–5.1)
Sodium: 158 meq/L — ABNORMAL HIGH (ref 135–145)
Total Bilirubin: 0.3 mg/dL (ref 0.3–1.2)
Total Protein: 6.8 g/dL (ref 6.0–8.3)

## 2012-09-29 LAB — CBC
HCT: 27.2 % — ABNORMAL LOW (ref 39.0–52.0)
Hemoglobin: 9.7 g/dL — ABNORMAL LOW (ref 13.0–17.0)
MCH: 30 pg (ref 26.0–34.0)
MCHC: 35.7 g/dL (ref 30.0–36.0)
MCV: 84.2 fL (ref 78.0–100.0)
Platelets: 100 K/uL — ABNORMAL LOW (ref 150–400)
RBC: 3.23 MIL/uL — ABNORMAL LOW (ref 4.22–5.81)
RDW: 13.9 % (ref 11.5–15.5)
WBC: 8.9 K/uL (ref 4.0–10.5)

## 2012-09-29 LAB — GLUCOSE, CAPILLARY
Glucose-Capillary: 223 mg/dL — ABNORMAL HIGH (ref 70–99)
Glucose-Capillary: 218 mg/dL — ABNORMAL HIGH (ref 70–99)
Glucose-Capillary: 168 mg/dL — ABNORMAL HIGH (ref 70–99)
Glucose-Capillary: 152 mg/dL — ABNORMAL HIGH (ref 70–99)

## 2012-09-29 LAB — PARATHYROID HORMONE, INTACT (NO CA): PTH: 156.3 pg/mL — ABNORMAL HIGH (ref 14.0–72.0)

## 2012-09-29 LAB — VANCOMYCIN, RANDOM: Vancomycin Rm: 14.1 ug/mL

## 2012-09-29 MED ORDER — VANCOMYCIN HCL IN DEXTROSE 1-5 GM/200ML-% IV SOLN
1000.0000 mg | INTRAVENOUS | Status: DC
  Administered 2012-09-29: 1000 mg via INTRAVENOUS
  Filled 2012-09-29: qty 200

## 2012-09-29 MED ORDER — SODIUM CHLORIDE 0.45 % IV SOLN
INTRAVENOUS | Status: DC
  Administered 2012-09-29 – 2012-09-30 (×2): via INTRAVENOUS

## 2012-09-29 MED ORDER — INSULIN DETEMIR 100 UNIT/ML ~~LOC~~ SOLN
8.0000 [IU] | Freq: Two times a day (BID) | SUBCUTANEOUS | Status: DC
  Administered 2012-09-29 – 2012-10-04 (×10): 8 [IU] via SUBCUTANEOUS
  Filled 2012-09-29 (×13): qty 0.08

## 2012-09-29 MED ORDER — PANTOPRAZOLE SODIUM 40 MG IV SOLR
40.0000 mg | INTRAVENOUS | Status: DC
  Administered 2012-09-29 – 2012-10-01 (×3): 40 mg via INTRAVENOUS
  Filled 2012-09-29 (×5): qty 40

## 2012-09-29 MED ORDER — INSULIN ASPART 100 UNIT/ML ~~LOC~~ SOLN
0.0000 [IU] | SUBCUTANEOUS | Status: DC
  Administered 2012-09-29: 4 [IU] via SUBCUTANEOUS
  Administered 2012-09-30 – 2012-10-01 (×7): 3 [IU] via SUBCUTANEOUS
  Administered 2012-10-01: 4 [IU] via SUBCUTANEOUS

## 2012-09-29 MED ORDER — SODIUM CHLORIDE 0.9 % IV SOLN
1020.0000 mg | Freq: Once | INTRAVENOUS | Status: AC
  Administered 2012-09-29: 1020 mg via INTRAVENOUS
  Filled 2012-09-29 (×2): qty 34

## 2012-09-29 NOTE — Progress Notes (Signed)
[facility] Progress Note [name] TEAM 1 - Stepdown/ICU TEAM   Kurt [name] JYN:[id number] DOB: April 21, 1929 DOA: 09/27/2012 PCP: Nadean Corwin, MD  Brief narrative: 79 year old male patient admitted in late March for seizure disorder exacerbation. Was changed to Keppra and discharged to nursing facility. Per the patient's family he was having increasing difficulty eating over the past week. Over the past 24 hours prior to admission he was unable to eat entirely and was having difficulty speaking. In addition to these symptoms the patient also noticed increased swelling in the submandibular area on the right side that has progressively worsened over 24 hours which prompted the family to bring the patient to the emergency department. In the ER patient was found to have acute renal failure with metabolic acidosis and creatinine greater than 8 which was much higher than his baseline of 1.85 back in March. CT of the neck soft tissue demonstrated possible submandibular infection. ENT physician was consulted. When initially evaluated by the admitting physician the patient was found to be mildly hypotensive and appeared quite dehydrated.   Assessment/Plan:  ARF (acute renal failure) on CKD (chronic kidney disease), stage III -baseline Scr 1.85 -current Scr has trended down to 4.82 after hydration so will continue hydration -acidosis has resolved so given persistent hypernatremia will dc bicarb drip in favor of 1/2 NS (see renal note) -suspect ATN due to preadmit ARB plus hypotension due to anti-HTN meds and diuretics -Appreciate Renal consult -hyperkalemia has resolved  Acute Submandibular sialoadenitis -appreciate ENT assistance -cont IV steroids and empiric anbx's -cont supportive care -ENT ordered FNA but radiology notes need to wait for anbx's treatment to complete first  Dysphagia -due to neck swelling -ST rec NPO for now -repeat swallow eval once neck edema  improved  Hypernatremia/Dehydration -change IVF to 1/2 NS -follow lytes  Hypertension -controlled and actually low/soft -cont to hold tenormin and HCTZ -avoid ARB (losartan)  Seizure disorder, with left leg weakness (Todds phenomenon) -Cont IV Keppra  Diabetes mellitus without complication -hold Metformin and would not resume after dc -cont SSI -watch for increases in CBG with steroids  DVT prophylaxis: SCDs Code Status: DO NOT RESUSCITATE Family Communication: Patient Disposition Plan: Transfer to telemetry floor Isolation: None  Consultants: Nephrology  Procedures: None  Antibiotics: Clindamycin 4/22 >>> 4/22 Zosyn 4/22 >>> Vancomycin 4/22 >>>  HPI/Subjective: Patient awake and more alert than yesterday-confused/without complaints.  Denies chest pain fevers chills nausea or vomiting.  Objective: Blood pressure 130/67, pulse 65, temperature 98 F (36.7 C), temperature source Oral, resp. rate 12, height 6' 3.2" (1.91 m), weight 88.7 kg (195 lb 8.8 oz), SpO2 96.00%.  Intake/Output Summary (Last 24 hours) at 09/29/12 1244 Last data filed at 09/29/12 1106  Gross per 24 hour  Intake   2410 ml  Output   1475 ml  Net    935 ml   Exam: General: No acute respiratory distress ENT: R neck swollen with boggy/tender mass, oral mucous membranes improved and much less dry- now pink and moist Lungs: Clear to auscultation bilaterally without wheezes or crackles, RA Cardiovascular: Regular rate and rhythm without murmur gallop or rub normal S1 and S2, no peripheral edema or JVD Abdomen: Nontender, nondistended, soft, bowel sounds positive, no rebound, no ascites, no appreciable mass Musculoskeletal: No significant cyanosis, clubbing of bilateral lower extremities Neurological: Alert and oriented x 1-speaking better, moves all extremities x 4 without focal neurological deficits  Data Reviewed: Basic Metabolic Panel:  Recent Labs Lab 09/27/12 1640 09/28/12 0540  09/29/12  0445  NA 148* 153* 158*  K 5.8* 4.5 3.5  CL 117* 120* 122*  CO2 14* 18* 26  GLUCOSE 127* 131* 233*  BUN 205* 189* 153*  CREATININE 8.12* 6.94* 4.82*  CALCIUM 10.4 9.6 9.9  PHOS  --  6.6* 3.9   Liver Function Tests:  Recent Labs Lab 09/27/12 1640 09/28/12 0540 09/29/12 0445  AST 16 11 10   ALT 10 7 6   ALKPHOS 52 45 43  BILITOT 0.3 0.3 0.3  PROT 8.2 6.9 6.8  ALBUMIN 3.4* 2.8* 2.8*   CBC:  Recent Labs Lab 09/27/12 1640 09/28/12 0540 09/29/12 0445  WBC 15.7* 12.7* 8.9  NEUTROABS 13.6* 10.9*  --   HGB 12.0* 10.0* 9.7*  HCT 34.5* 28.2* 27.2*  MCV 87.8 86.0 84.2  PLT 98* 84* 100*   CBG:  Recent Labs Lab 09/28/12 2017 09/29/12 0009 09/29/12 0347 09/29/12 0819 09/29/12 1127  GLUCAP 226* 201* 235* 223* 218*    Recent Results (from the past 240 hour(s))  CULTURE, BLOOD (ROUTINE X 2)     Status: None   Collection Time    09/27/12  8:45 PM      Result Value Range Status   Specimen Description BLOOD LEFT ARM   Final   Special Requests BOTTLES DRAWN AEROBIC AND ANAEROBIC 10CC EACH   Final   Culture  Setup Time 09/28/2012 02:16   Final   Culture     Final   Value:        BLOOD CULTURE RECEIVED NO GROWTH TO DATE CULTURE WILL BE HELD FOR 5 DAYS BEFORE ISSUING A FINAL NEGATIVE REPORT   Report Status PENDING   Incomplete  CULTURE, BLOOD (ROUTINE X 2)     Status: None   Collection Time    09/27/12  8:55 PM      Result Value Range Status   Specimen Description BLOOD RIGHT HAND   Final   Special Requests BOTTLES DRAWN AEROBIC ONLY 5CC   Final   Culture  Setup Time 09/28/2012 02:16   Final   Culture     Final   Value:        BLOOD CULTURE RECEIVED NO GROWTH TO DATE CULTURE WILL BE HELD FOR 5 DAYS BEFORE ISSUING A FINAL NEGATIVE REPORT   Report Status PENDING   Incomplete  MRSA PCR SCREENING     Status: Abnormal   Collection Time    09/28/12 11:34 AM      Result Value Range Status   MRSA by PCR POSITIVE (*) NEGATIVE Final   Comment:            The  GeneXpert MRSA Assay (FDA     approved for NASAL specimens     only), is one component of a     comprehensive MRSA colonization     surveillance program. It is not     intended to diagnose MRSA     infection nor to guide or     monitor treatment for     MRSA infections.     RESULT CALLED TO, READ BACK BY AND VERIFIED WITH:     MATTHEWS,H RN @ 1349 ON 09/28/12 BY LEONARD,A     Studies:  Recent x-ray studies have been reviewed in detail by the Attending Physician  Scheduled Meds:  Reviewed in detail by the Attending Physician   Junious Silk, ANP [facility] Office  [phone] Pager [phone]  On-Call/Text Page:      [email or web address]      password [facility]  If 7PM-7AM, please contact night-coverage [email or web address] Password [facility] 09/29/2012, 12:44 PM   LOS: 2 days   I have personally examined this patient and reviewed the entire database. I have reviewed the above note, made any necessary editorial changes, and agree with its content.  Lonia Blood, MD [facility]

## 2012-09-29 NOTE — Progress Notes (Signed)
Subjective: Interval History: has no complaint of doing much better.  Objective: Vital signs in last 24 hours: Temp:  [96.9 F (36.1 C)-98.1 F (36.7 C)] 97 F (36.1 C) (04/23 0741) Pulse Rate:  [59-72] 59 (04/23 0741) Resp:  [11-19] 14 (04/23 0741) BP: (105-131)/(61-83) 131/83 mmHg (04/23 0741) SpO2:  [98 %-99 %] 99 % (04/23 0741) Weight:  [88.7 kg (195 lb 8.8 oz)] 88.7 kg (195 lb 8.8 oz) (04/23 0350) Weight change: -11 kg (-24 lb 4 oz)  Intake/Output from previous day: 04/22 0701 - 04/23 0700 In: 2708 [I.V.:2403; IV Piggyback:305] Out: 1677 [Urine:1677] Intake/Output this shift:    General appearance: cooperative and slowed mentation Resp: clear to auscultation bilaterally Cardio: S1, S2 normal and systolic murmur: holosystolic 2/6, blowing at apex GI: pos bs,soft,liver down 2 cm Extremities: extremities normal, atraumatic, no cyanosis or edema  Lab Results:  Recent Labs  09/28/12 0540 09/29/12 0445  WBC 12.7* 8.9  HGB 10.0* 9.7*  HCT 28.2* 27.2*  PLT 84* 100*   BMET:  Recent Labs  09/28/12 0540 09/29/12 0445  NA 153* 158*  K 4.5 3.5  CL 120* 122*  CO2 18* 26  GLUCOSE 131* 233*  BUN 189* 153*  CREATININE 6.94* 4.82*  CALCIUM 9.6 9.9   No results found for this basename: PTH,  in the last 72 hours Iron Studies:  Recent Labs  09/28/12 1746  IRON 22*  TIBC 139*    Studies/Results: Dg Chest 2 View  09/27/2012  *RADIOLOGY REPORT*  Clinical Data: altered mental status  CHEST - 2 VIEW  Comparison: 08/30/2012  Findings: Lungs remain clear.  Negative for heart failure or pneumonia.  No mass lesion.  Heart size is normal.  IMPRESSION: No acute abnormality.   Original Report Authenticated By: Janeece Riggers, M.D.    Ct Soft Tissue Neck Wo Contrast  09/27/2012  *RADIOLOGY REPORT*  Clinical Data: Palpable mass right neck.  Difficulty swallowing.  History of meningioma resection skull base.  CT NECK WITHOUT CONTRAST  Technique:  Multidetector CT imaging of the  neck was performed without intravenous contrast.  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  Based on the prior MRI there is residual meningioma present.  There is encephalomalacia in the inferior frontal lobes. There is obstruction of the sphenoid sinus which is filled with secretions, similar to prior imaging.  This is incompletely evaluated on the study of the neck.  Right submandibular gland is enlarged and markedly asymmetric when compared to the left.  The right gland measures approximately 2 x 3 cm.  Left gland is   atrophic measuring approximately 9 x 19 mm. The right submandibular gland is enlarged and there is some stranding in the surrounding fat and thickening of the right platysmus muscle.  Small submental nodes are present on the right. There is asymmetry of the floor the mouth on the right.  Without intravenous contrast, evaluation is limited.  No pathologically enlarged lymph nodes.  Thyroid is normal.  Lung apices are clear.  Parotid gland is normal bilaterally. Cervical disc degeneration and spondylosis without acute bony abnormality. There is thickening and calcification of the transverse ligament of C1.  This is causing spinal stenosis.  IMPRESSION: Enlargement of the right submandibular gland with some stranding in the surrounding fat.  There is asymmetric soft tissue thickening of the floor the mouth on the right.  The patient's white blood count is elevated today and this may be  due to acute infection.  Lack of intravenous contrast makes it difficult to evaluate for abscess. Neoplasm in the submandibular and also possible.  Given the elevated white count, treatment for infection is suggested.  If the palpable mass persists after treatment repeat CT neck with contrast is suggested to exclude a mass lesion.  Postop changes of meningioma resection with chronic sinusitis.   Original Report Authenticated By:  Janeece Riggers, M.D.    US Renal  09/27/2012  *RADIOLOGY REPORT*  Clinical Data: Mass  RENAL/URINARY TRACT ULTRASOUND COMPLETE  Comparison:  Renal ultrasound 05/09/2009  Findings:  Right Kidney:  11.8 cm.  Limited visualization of the right kidney due to overlying bowel gas.  Increased echogenicity of the cortex. Right upper pole cyst 2.5 x 3.0 cm.  Cyst in the left lower pole containing internal septations measuring 4.5 x 6.7 cm.  This is probably benign.  Negative for obstruction.  Left Kidney:  11.1 cm in length.  3 x 4 cm left upper pole cyst.  3 x 3 cm left upper pole cyst.  Bladder:  Empty bladder appear  IMPRESSION: Bilateral renal cysts.  No obstruction.   Original Report Authenticated By: Janeece Riggers, M.D.     I have reviewed the patient's current medications.  Assessment/Plan: 1 AKI improving.  Acid/base/vol ok. Will cont IVF but with ^ S Na, change to hypotonic fluid. 2 Sz disorder 3 Anemia Fe low, give iv 4 Submandib gland swelling better P 1/2 NS, Avoid ACEI/ARB, AB    LOS: 2 days   Derward Marple L 09/29/2012,9:45 AM

## 2012-09-29 NOTE — Progress Notes (Signed)
Inpatient Diabetes Program Recommendations  AACE/ADA: New Consensus Statement on Inpatient Glycemic Control (2013)  Target Ranges:  Prepandial:   less than 140 mg/dL      Peak postprandial:   less than 180 mg/dL (1-2 hours)      Critically ill patients:  140 - 180 mg/dL   Hyperglycemia on steroid therapy Noted Decadron therapy completed. Pt on Metformin at home?  Elevated Creatinine presently.  Please alert pt not to take.  Thank you, Lenor Coffin, RN, CNS, Diabetes Coordinator [phone])

## 2012-09-29 NOTE — Progress Notes (Addendum)
ANTIBIOTIC CONSULT NOTE - FOLLOW UP  Pharmacy Consult for Vancomycin and Zosyn Indication: Empiric coverage for submandibular abscess  No Known Allergies  Patient Measurements: Height: 6' 3.2" (191 cm) Weight: 195 lb 8.8 oz (88.7 kg) IBW/kg (Calculated) : 84.95  Vital Signs: Temp: 97 F (36.1 C) (04/23 0741) Temp src: Oral (04/23 0741) BP: 131/83 mmHg (04/23 0741) Pulse Rate: 59 (04/23 0741) Intake/Output from previous day: 04/22 0701 - 04/23 0700 In: 2708 [I.V.:2403; IV Piggyback:305] Out: 1677 [Urine:1677] Intake/Output from this shift:    Labs:  Recent Labs  09/27/12 1640 09/28/12 0540 09/28/12 1200 09/29/12 0445  WBC 15.7* 12.7*  --  8.9  HGB 12.0* 10.0*  --  9.7*  PLT 98* 84*  --  100*  LABCREA  --   --  84.59  --   CREATININE 8.12* 6.94*  --  4.82*   Estimated Creatinine Clearance: 14.5 ml/min (by C-G formula based on Cr of 4.82).  Recent Labs  09/29/12 0445  VANCORANDOM 14.1     Microbiology: Vanc 4/21 >> Zosyn 4/21 >>  4/21 blood x 2 ngtd 4/22 MRSA PCR +  Assessment: 79 yo M admitted 4/21 with increasing neck mass/abscess and noted AKI with SCr 8.12 (baseline 1.8-2).  Renal function has improved since admission and SCr today is 4.82.    Pt received Vancomycin 1750mg  IV x 1 4/21 and a random level drawn with AM labs was 14.1.  Our goal Vancomycin trough level is 10-15 mcg/ml.  Will start a maintenance regimen today and continue to watch renal function for further adjustments as needed.  Goal of Therapy:  Vancomycin trough level 10-15 mcg/ml  Plan:  Vancomycin 1 gm IV q48h. Continue Zosyn 2.25 gm IV q8h.  Will adjust if/ when CrCl >20. Continue to follow renal function and adjust dose if needed. Follow-up culture data and clinical progress.  [employer], Pharm.D., BCPS Clinical Pharmacist Pager [phone] 09/29/2012 10:17 AM

## 2012-09-29 NOTE — Progress Notes (Signed)
09/29/2012 patient transfer from 3300 to 6700 at 1820. He is alert, oriented to person, he is aware he's in [place], but not in hospital. Patient does have a foley and mittens to prevent him from pull his lines. Skin is good, he does have dsicoloration on bilateral lower legs, feet are dry. Patient sacrum no breakage, redness or excoriation. He's on SCD for VTE and patient is npo. [facility] RN.

## 2012-09-29 NOTE — Progress Notes (Signed)
Clinical Social Work Department BRIEF PSYCHOSOCIAL ASSESSMENT 09/29/2012  Patient:  Kurt [name], Kurt [name]     Account Number:  [id number]     Admit date:  09/27/2012  Clinical Social Worker:  Lourdes Sledge  Date/Time:  09/29/2012 10:32 AM  Referred by:  RN  Date Referred:  09/29/2012 Referred for  SNF Placement   Other Referral:   Interview type:  Family Other interview type:   CSW completed assessment with pt spouse and 2 daughters (Ms. Claudette Laws and Ms. Montez Morita.)    PSYCHOSOCIAL DATA Living Status:  FACILITY Admitted from facility:  [facility] Level of care:  Skilled Nursing Facility Primary support name:  Tawan Degroote [phone] Primary support relationship to patient:  SPOUSE Degree of support available:   Pt spouse and children are very involved in pt care.    CURRENT CONCERNS Current Concerns  Post-Acute Placement   Other Concerns:    SOCIAL WORK ASSESSMENT / PLAN CSW informed that pt was admitted from [facility].    CSW unable to complete assessment with pt who presents disoriented. CSW completed assessment with pt spouse and daughters. CSW obtained most information from pt daughter (Ms. Montez Morita) who is appointed to assist with dc plans. Pt daughter informed CSW that family would prefer placement for pt at [facility]. CSW informed family that CSW would do a new SNF search for [facility]. CSW explored whether family has applied for Medicaid as pt insurance could deny SNF placement if it is anticipated that pt needs LT care. CSW provided education regarding pt [employer], ST vs LT care and the importance of applying for Medicaid asap. Pt daughter appreciative of information and understood concerns. CSW to do a SNF search and follow up with  bed offers.   Assessment/plan status:  Psychosocial Support/Ongoing Assessment of Needs Other assessment/ plan:   Information/referral to community resources:   CSW provided pt  family with a SNF list and CSW contact information. CSW also provided family with education regarding applying for Medicaid. CSW provided documentation on the Medicaid process.    PATIENT'S/FAMILY'S RESPONSE TO PLAN OF CARE: Pt laying in bed and unable to participate in assessment. CSW gathered pt information from pt spouse and daughters who are requesting placement at [facility]. CSW received consent to do a SNF search.    CSW to remain following.       Theresia Bough, MSW, Theresia Majors [phone]

## 2012-09-29 NOTE — Progress Notes (Signed)
Kurt [name] has been active with [facility] Care Management in the recent past. He went to SNF after last hospital discharge. Appears patient will go to SNF upon discharge this time as well.   Atika Hall,MSN-Ed, RN,BSN- [facility] Liaison-[phone]

## 2012-09-29 NOTE — Progress Notes (Addendum)
Clinical Social Work Department CLINICAL SOCIAL WORK PLACEMENT NOTE 09/29/2012  Patient:  SHERRICK, ARAKI  Account Number:  [id number] Admit date:  09/27/2012  Clinical Social Worker:  Theresia Bough, Theresia Majors  Date/time:  09/29/2012 10:45 AM  Clinical Social Work is seeking post-discharge placement for this patient at the following level of care:   SKILLED NURSING   (*CSW will update this form in Epic as items are completed)   09/29/2012  Patient/family provided with [facility] Department of Clinical Social Work's list of facilities offering this level of care within the geographic area requested by the patient (or if unable, by the patient's family).  09/29/2012  Patient/family informed of their freedom to choose among providers that offer the needed level of care, that participate in Medicare, Medicaid or managed care program needed by the patient, have an available bed and are willing to accept the patient.  09/29/2012  Patient/family informed of [facility]' ownership interest in [facility], as well as of the fact that they are under no obligation to receive care at this facility.  PASARR submitted to EDS on  PASARR number received from EDS on EXISITING # [id number]  FL2 transmitted to all facilities in geographic area requested by pt/family on  09/29/2012 FL2 transmitted to all facilities within larger geographic area on   Patient informed that his/her managed care company has contracts with or will negotiate with  certain facilities, including the following:     Patient/family informed of bed offers received:  09/29/12 Patient chooses bed at [facility] Physician recommends and patient chooses bed at    Patient to be transferred to [facility] on 10/07/12  Patient to be transferred to facility by ambulance  The following physician request were entered in Epic:   Additional Comments: Family requesting placement at [facility]. 10/07/12 -  Family aware of d/c to SNF.  Theresia Bough, MSW, Theresia Majors [phone]

## 2012-09-29 NOTE — Progress Notes (Signed)
Clinical Child psychotherapist (CSW) notified pt daughter Ms. Montez Morita that [facility] is able to offer placement for pt at discharge. CSW to remain following and assist with dc when pt is medically ready.  Theresia Bough, MSW, Theresia Majors [phone]

## 2012-09-29 NOTE — Progress Notes (Signed)
Report called to [facility], receiving RN on  6700. VSS. Transferred to 6739 via bed with personal belongings. Called patient's family member with new room number.  [facility]

## 2012-09-29 NOTE — Progress Notes (Signed)
Speech Language Pathology Dysphagia Treatment Patient Details Name: Kurt [name] MRN: [id number] DOB: [birth year] Today's Date: 09/29/2012 Time: 4098-1191 SLP Time Calculation (min): 15 min  Assessment / Plan / Recommendation Clinical Impression  Pt presents with improved management of secretions, but continues to demonstrate overt evidence of aspiration with thin and puree textures. Pt with a significantly delayed swallow response with overt pooling of bolus in the pharynx with wet vocal quality as pt is talking before swallowing. Even with max verbal cues the pt is hesitant to initiate swallows/strategies to clear pharynx. Also suspect weakness/residual as difficutly persists after the swallow and there is a delayed cough with all textures. At this time, aspriation risk is severe. Pt is not appropriate for PO. Await improved responsiveness/arousal for participation in diet or MBS if needed.     Diet Recommendation  Continue with Current Diet: NPO    SLP Plan     Pertinent Vitals/Pain NA   Swallowing Goals  SLP Swallowing Goals Goal #3: Pt will participate in therapeutic po trials with increased secretion management  (coughing, throat clear) with mod verbal cues  Swallow Study Goal #3 - Progress: Progressing toward goal  General Temperature Spikes Noted: No Respiratory Status: Room air Behavior/Cognition: Cooperative;Pleasant mood;Alert;Requires cueing Oral Cavity - Dentition: Edentulous Patient Positioning: Upright in bed  Oral Cavity - Oral Hygiene Patient is HIGH RISK - Oral Care Protocol followed (see row info): Yes   Dysphagia Treatment Treatment focused on: Upgraded PO texture trials Treatment Methods/Modalities: Skilled observation;Differential diagnosis Patient observed directly with PO's: Yes Type of PO's observed: Dysphagia 1 (puree);Thin liquids Feeding: Needs assist Liquids provided via: Cup Oral Phase Signs & Symptoms: Prolonged oral phase;Prolonged bolus  formation Pharyngeal Phase Signs & Symptoms: Suspected delayed swallow initiation;Multiple swallows;Wet vocal quality;Delayed throat clear Type of cueing: Verbal Amount of cueing: Maximal   GO    [name], MA CCC-SLP [phone]  [name] 09/29/2012, 1:26 PM

## 2012-09-30 ENCOUNTER — Inpatient Hospital Stay (HOSPITAL_COMMUNITY): Payer: Medicare Other

## 2012-09-30 LAB — BASIC METABOLIC PANEL
CO2: 27 mEq/L (ref 19–32)
Calcium: 10.2 mg/dL (ref 8.4–10.5)
Creatinine, Ser: 3.27 mg/dL — ABNORMAL HIGH (ref 0.50–1.35)
GFR calc Af Amer: 19 mL/min — ABNORMAL LOW (ref >90)
Potassium: 2.9 mEq/L — ABNORMAL LOW (ref 3.5–5.1)

## 2012-09-30 LAB — CBC
MCH: 30.4 pg (ref 26.0–34.0)
RBC: 3.36 MIL/uL — ABNORMAL LOW (ref 4.22–5.81)
WBC: 10.3 10*3/uL (ref 4.0–10.5)

## 2012-09-30 LAB — RENAL FUNCTION PANEL
BUN: 126 mg/dL — ABNORMAL HIGH (ref 6–23)
Chloride: 126 mEq/L — ABNORMAL HIGH (ref 96–112)
Creatinine, Ser: 3.76 mg/dL — ABNORMAL HIGH (ref 0.50–1.35)
Glucose, Bld: 175 mg/dL — ABNORMAL HIGH (ref 70–99)
Potassium: 3.2 mEq/L — ABNORMAL LOW (ref 3.5–5.1)

## 2012-09-30 LAB — GLUCOSE, CAPILLARY
Glucose-Capillary: 128 mg/dL — ABNORMAL HIGH (ref 70–99)
Glucose-Capillary: 131 mg/dL — ABNORMAL HIGH (ref 70–99)
Glucose-Capillary: 148 mg/dL — ABNORMAL HIGH (ref 70–99)

## 2012-09-30 MED ORDER — POTASSIUM CHLORIDE 20 MEQ/15ML (10%) PO LIQD
60.0000 meq | Freq: Once | ORAL | Status: DC
  Filled 2012-09-30: qty 45

## 2012-09-30 MED ORDER — STARCH (THICKENING) PO POWD
ORAL | Status: DC | PRN
  Filled 2012-09-30: qty 227

## 2012-09-30 MED ORDER — ADULT MULTIVITAMIN W/MINERALS CH
1.0000 | ORAL_TABLET | Freq: Every day | ORAL | Status: DC
  Administered 2012-10-02 – 2012-10-07 (×6): 1 via ORAL
  Filled 2012-09-30 (×8): qty 1

## 2012-09-30 MED ORDER — DEXTROSE 5 % IV SOLN
INTRAVENOUS | Status: DC
  Administered 2012-09-30: 09:00:00 via INTRAVENOUS

## 2012-09-30 MED ORDER — DEXTROSE 50 % IV SOLN
INTRAVENOUS | Status: AC
  Administered 2012-09-30: 50 mL
  Filled 2012-09-30: qty 50

## 2012-09-30 MED ORDER — ENSURE PUDDING PO PUDG
1.0000 | Freq: Three times a day (TID) | ORAL | Status: DC
  Administered 2012-10-01 – 2012-10-07 (×15): 1 via ORAL

## 2012-09-30 MED ORDER — MUPIROCIN 2 % EX OINT
1.0000 "application " | TOPICAL_OINTMENT | Freq: Two times a day (BID) | CUTANEOUS | Status: AC
  Administered 2012-09-30 – 2012-10-04 (×9): 1 via NASAL
  Filled 2012-09-30 (×2): qty 22

## 2012-09-30 MED ORDER — LORAZEPAM 2 MG/ML IJ SOLN
1.0000 mg | Freq: Once | INTRAMUSCULAR | Status: AC
  Administered 2012-09-30: 1 mg via INTRAVENOUS
  Filled 2012-09-30: qty 1

## 2012-09-30 MED ORDER — POTASSIUM CHLORIDE 10 MEQ/100ML IV SOLN
10.0000 meq | INTRAVENOUS | Status: AC
  Administered 2012-09-30 – 2012-10-01 (×4): 10 meq via INTRAVENOUS
  Filled 2012-09-30 (×4): qty 100

## 2012-09-30 MED ORDER — CHLORHEXIDINE GLUCONATE CLOTH 2 % EX PADS
6.0000 | MEDICATED_PAD | Freq: Every day | CUTANEOUS | Status: AC
  Administered 2012-10-01 – 2012-10-04 (×4): 6 via TOPICAL

## 2012-09-30 MED ORDER — DEXTROSE-NACL 5-0.45 % IV SOLN
INTRAVENOUS | Status: DC
  Administered 2012-09-30: 16:00:00 via INTRAVENOUS

## 2012-09-30 MED ORDER — POTASSIUM CHLORIDE 2 MEQ/ML IV SOLN
INTRAVENOUS | Status: DC
  Administered 2012-09-30: 19:00:00 via INTRAVENOUS
  Filled 2012-09-30 (×3): qty 1000

## 2012-09-30 NOTE — Progress Notes (Signed)
INITIAL NUTRITION ASSESSMENT  DOCUMENTATION CODES Per approved criteria  -Severe malnutrition in the context of acute illness or injury   INTERVENTION: 1. Monitor magnesium, potassium, and phosphorus daily for at least 3 days, MD to replete as needed, as pt is at risk for refeeding syndrome given prolonged suboptimal oral intake. 2. Add Ensure Pudding po TID, each supplement provides 170 kcal and 4 grams of protein.  3. If oral intake does not improve, recommend initiation of short-term nutrition support. 4. Add MVI daily 5. RD to continue to follow nutrition care plan  NUTRITION DIAGNOSIS: Swallowing difficulty related to mandibular infection as evidenced by need for modified diet and liquids.   Goal: Intake to meet >90% of estimated nutrition needs.  Monitor:  weight trends, lab trends, I/O's, PO intake, supplement tolerance  Reason for Assessment: Malnutrition Screening Tool  79 y.o. male  Admitting Dx: <principal problem not specified>  ASSESSMENT: Recently admitted for seizures, d/c to SNF. Difficulty with eating x 1 week. Work-up revealed submandibular sialoadenitis.  Initial BSE recommended NPO. MBSS today recommending Dysphagia 1; Nectar Thickened Liquids.  Renal consulted for AKI and CKD. Per MD, AKI likely hemodynamic with volume depletion.  Per family at bedside, pt has not had anything to eat x 1 week. His swallowing worsened to the point where he was unable to swallow even the smallest portion of foods. Discussed Magic Cup and Ensure Pudding supplements, family agreeable.  Pt meets criteria for severe MALNUTRITION in the context of acute illness as evidenced by 11% wt loss x 1 month and intake <50% x at least 5 days.   Height: Ht Readings from Last 1 Encounters:  09/27/12 6' 3.2" (1.91 m)    Weight: Wt Readings from Last 1 Encounters:  09/29/12 196 lb 6.9 oz (89.1 kg)    Ideal Body Weight: 196 lb  % Ideal Body Weight: 100%  Wt Readings from Last 10  Encounters:  09/29/12 196 lb 6.9 oz (89.1 kg)  09/01/12 219 lb 11.2 oz (99.655 kg)    Usual Body Weight: 219 lb  % Usual Body Weight: 89%  BMI:  Body mass index is 24.42 kg/(m^2). WNL  Estimated Nutritional Needs: Kcal: 1800 - 2000 Protein: 60 - 75 grams Fluid: 1.8 - 2  Skin: intact  Diet Order: Dysphagia 1; Nectar Thickened  EDUCATION NEEDS: -No education needs identified at this time   Intake/Output Summary (Last 24 hours) at 09/30/12 1457 Last data filed at 09/30/12 0546  Gross per 24 hour  Intake    150 ml  Output    825 ml  Net   -675 ml    Last BM: 4/23  Labs:   Recent Labs Lab 09/28/12 0540 09/29/12 0445 09/30/12 0510  NA 153* 158* 163*  K 4.5 3.5 3.2*  CL 120* 122* 126*  CO2 18* 26 28  BUN 189* 153* 126*  CREATININE 6.94* 4.82* 3.76*  CALCIUM 9.6 9.9 10.4  PHOS 6.6* 3.9 3.9  GLUCOSE 131* 233* 175*    CBG (last 3)   Recent Labs  09/30/12 0549 09/30/12 0752 09/30/12 1210  GLUCAP 128* 128* 148*    Scheduled Meds: . [START ON 10/01/2012] Chlorhexidine Gluconate Cloth  6 each Topical Q0600  . insulin aspart  0-20 Units Subcutaneous Q4H  . insulin detemir  8 Units Subcutaneous BID  . levETIRAcetam  500 mg Intravenous Q12H  . mupirocin ointment  1 application Nasal BID  . pantoprazole (PROTONIX) IV  40 mg Intravenous Q24H  . piperacillin-tazobactam (ZOSYN)  IV  2.25 g Intravenous Q8H  . sodium chloride  3 mL Intravenous Q12H    Continuous Infusions: . dextrose 75 mL/hr at 09/30/12 0859  . dextrose 5 % and 0.45% NaCl      Past Medical History  Diagnosis Date  . Hypertension   . Hyperlipidemia   . Seizure disorder   . CKD (chronic kidney disease), stage III   . Altered mental state 08/31/2012    "came into hospital a little disoriented; it's gotten worse; this is not normal" (08/31/2012)  . Seizures   . Type II diabetes mellitus   . Chronic lower back pain     Past Surgical History  Procedure Laterality Date  . Brain tumor  excision    . Brain meningioma excision  08/2000    Jarold Motto MS, RD, LDN Pager: [phone] After-hours pager: [phone]

## 2012-09-30 NOTE — Evaluation (Signed)
Physical Therapy Evaluation Patient Details Name: Kurt [name] MRN: [id number] DOB: 02/06/1929 Today's Date: 09/30/2012 Time: 4098-1191 PT Time Calculation (min): 29 min  PT Assessment / Plan / Recommendation Clinical Impression  Mr. Terriquez is an 79 y/o male admitted with ARF. Prior to admission pt lived in a SNF for short term rehab.  Family report pt was able to ambulate and ride a stationary bike until a few days ago.  Today pt unable to stand without +2 total assist.  Pt appears to be avoiding his right LE in standing.  Further evaluation of the R LE reveal no c/o pain or ROM deficits to either LE.  Acute PT will continue to follow pt to progress mobiity for return to SNF.       PT Assessment  Patient needs continued PT services    Follow Up Recommendations  SNF;Supervision/Assistance - 24 hour    Does the patient have the potential to tolerate intense rehabilitation      Barriers to Discharge        Equipment Recommendations  None recommended by PT    Recommendations for Other Services     Frequency Min 3X/week    Precautions / Restrictions Precautions Precautions: Fall Precaution Comments: history of falling.   Restrictions Weight Bearing Restrictions: No   Pertinent Vitals/Pain No c/o pain.        Mobility  Bed Mobility Bed Mobility: Rolling Right;Rolling Left;Supine to Sit;Sit to Supine Rolling Right: 4: Min assist;With rail Rolling Left: 4: Min assist;With rail Supine to Sit: 3: Mod assist;HOB flat;With rails Sit to Supine: 3: Mod assist;HOB flat Details for Bed Mobility Assistance: Cues for rolling technique, assist to manage trunk for transition to sit, assist to raise bilateral LE into bed for transition to supine from sitting.   Transfers Transfers: Sit to Stand;Stand to Sit Sit to Stand: 1: +2 Total assist;From bed;From elevated surface (3 attempts.) Sit to Stand: Patient Percentage: 30% Stand to Sit: 1: +2 Total assist;To bed Stand to Sit: Patient  Percentage: 40% Details for Transfer Assistance: Pt required assist to raise pt hips and trunk from bed.  Pt presents with posterior lean in standing and was unable to establish standing balance.  Pt avoids RLE leaning to his left but does not c/o any pain in RLE Ambulation/Gait Ambulation/Gait Assistance: Not tested (comment) Wheelchair Mobility Wheelchair Mobility: No    Exercises     PT Diagnosis: Difficulty walking;Generalized weakness;Altered mental status  PT Problem List: Decreased strength;Decreased activity tolerance;Decreased balance;Decreased mobility;Decreased coordination;Decreased knowledge of use of DME PT Treatment Interventions: DME instruction;Gait training;Functional mobility training;Therapeutic activities;Patient/family education;Neuromuscular re-education;Cognitive remediation;Balance training   PT Goals Acute Rehab PT Goals PT Goal Formulation: With patient/family Time For Goal Achievement: 10/14/12 Potential to Achieve Goals: Fair Pt will go Supine/Side to Sit: with modified independence PT Goal: Supine/Side to Sit - Progress: Goal set today Pt will go Sit to Supine/Side: with modified independence;with HOB 0 degrees PT Goal: Sit to Supine/Side - Progress: Goal set today Pt will go Sit to Stand: with supervision PT Goal: Sit to Stand - Progress: Goal set today Pt will go Stand to Sit: with supervision PT Goal: Stand to Sit - Progress: Goal set today Pt will Transfer Bed to Chair/Chair to Bed: with supervision PT Transfer Goal: Bed to Chair/Chair to Bed - Progress: Goal set today Pt will Ambulate: 16 - 50 feet;with min assist;with rolling walker PT Goal: Ambulate - Progress: Goal set today  Visit Information  Last PT Received On: 09/30/12  Assistance Needed: +2    Subjective Data  Subjective: agree to PT eval.     Prior Functioning  Home Living Lives With: Spouse Available Help at Discharge: Family;Available 24 hours/day Type of Home: House Home  Access: Stairs to enter [employer] of Steps: 2 Entrance Stairs-Rails: None Home Layout: One level Bathroom Shower/Tub: Tub/shower unit;Curtain Home Adaptive Equipment: Straight cane Prior Function Level of Independence: Independent Able to Take Stairs?: Yes Driving: No Vocation: Retired Musician: Web designer       Extremity/Trunk Assessment Right Lower Extremity Assessment RLE ROM/Strength/Tone: Deficits RLE ROM/Strength/Tone Deficits: generalized LE weakness 4+/5 grossly Left Lower Extremity Assessment LLE ROM/Strength/Tone: Deficits LLE ROM/Strength/Tone Deficits: generalized LE weakness 4+/5 grossly   Balance Balance Balance Assessed: Yes Static Sitting Balance Static Sitting - Balance Support: No upper extremity supported;Feet supported Static Sitting - Level of Assistance: 7: Independent Static Sitting - Comment/# of Minutes: Pt sat on EOB for several minutes with no LOB.   Static Standing Balance Static Standing - Balance Support: Bilateral upper extremity supported Static Standing - Level of Assistance: 1: +2 Total assist Static Standing - Comment/# of Minutes: Pt presents with posterior/ Left lateral lean in standing and was unable to establish standing balance despite +2 total assist.    End of Session PT - End of Session Equipment Utilized During Treatment: Gait belt Activity Tolerance: Patient tolerated treatment well Patient left: in bed;with call bell/phone within reach;with bed alarm set;with family/visitor present Nurse Communication: Mobility status  GP     Jenalyn Girdner 09/30/2012, 4:31 PM Welma Mccombs L. Maelin Kurkowski DPT [phone]

## 2012-09-30 NOTE — Progress Notes (Signed)
Order received, chart reviewed, spoke with PT who stated pt needing quite a bit of A and that he had just recently finished working with pt. Tomorrow earlier may be better for pt. Will await and try to eval tomorrow.  Ignacia Palma, [place] [phone] 09/30/2012

## 2012-09-30 NOTE — Procedures (Signed)
Objective Swallowing Evaluation: Modified Barium Swallowing Study  Patient Details  Name: Kurt [name] MRN: [id number] Date of Birth: 1928-07-10  Today's Date: 09/30/2012 Time: 1110-1135 SLP Time Calculation (min): 25 min  Past Medical History:  Past Medical History  Diagnosis Date  . Hypertension   . Hyperlipidemia   . Seizure disorder   . CKD (chronic kidney disease), stage III   . Altered mental state 08/31/2012    "came into hospital a little disoriented; it's gotten worse; this is not normal" (08/31/2012)  . Seizures   . Type II diabetes mellitus   . Chronic lower back pain    Past Surgical History:  Past Surgical History  Procedure Laterality Date  . Brain tumor excision    . 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 male  with acute renal failure and right neck swelling. Pt c/o difficulty swallowing for past week. CT neck without contrast showed hypotrophic left 9mm submandibular gland and right homogeneous 2x3 cm submandibular gland without obvious mass or abscess and surrounding soft tissue stranding likely submandibular sialoadenitis. Pt started on antibiotics for treatment of mass.  Bedside swallow assessment recommended MBS.     Assessment / Plan / Recommendation Clinical Impression  Dysphagia Diagnosis: Severe oral phase dysphagia;Moderate pharyngeal phase dysphagia;Severe pharyngeal phase dysphagia Clinical impression: Pt. demonstrated severe oral dysphagia with poor ability to form bolus, maintain cohesion leading to residue throughout oral cavity and transit delays.  Pharyngeal phase includes moderate-severe impairments both sensory and motor tracts affected.  Delayed swallow initiation, reduced pharyngeal contraction. decreased laryngeal elevation and epiglottic closure led to moderate-severe vallecular and moderate pyriform sinus residue throughout study.  Frank aspiration of thin before and during swallow (basically silent with one  minimal throat clear).  Pt. does not sense majority of residue and was unable to effectively swallow additional times with therapeutic intervention via verbal/visual cues (several times a reflexive 2nd swallow performed and cleared mild amount).  Unfortunately at this time he aspiration does not appear preventable with any diet/liquid modifications.  Pt. confused with dementia and unable to perform compensatory techniques.  Dys 1 and nectar thick may have the least potential to penetrate/aspirate, however not guaranteed.  Discussed with Dr. Jomarie Longs and explained clinical findings and results of swallow assessments with precautions to pt.'s family (mom has dementia, 2 daughters present).  ST will continue to follow for education.       Treatment Recommendation  Therapy as outlined in treatment plan below    Diet Recommendation Dysphagia 1 (Puree);Nectar-thick liquid   Liquid Administration via: Cup;No straw Medication Administration: Crushed with puree Supervision: Full supervision/cueing for compensatory strategies Compensations: Slow rate;Small sips/bites;Check for pocketing;Multiple dry swallows after each bite/sip Postural Changes and/or Swallow Maneuvers: Seated upright 90 degrees    Other  Recommendations Oral Care Recommendations: Oral care QID   Follow Up Recommendations   (to be determined)    Frequency and Duration min 2x/week  2 weeks   Pertinent Vitals/Pain     SLP Swallow Goals Patient will utilize recommended strategies during swallow to increase swallowing safety with: Maximal cueing Goal #3: Pt will participate in therapeutic po trials with increased secretion management  (coughing, throat clear) with mod verbal cues  Swallow Study Goal #3 - Progress: Met Goal #4: Family will verbalize understanding of swallow precautions with min cues.      Reason for Referral Objectively evaluate swallowing function   Oral Phase Oral Preparation/Oral Phase Oral Phase: Impaired Oral -  Honey Oral - Honey Teaspoon: Weak lingual manipulation;Right pocketing in lateral sulci;Left pocketing in lateral sulci;Delayed oral transit;Reduced posterior propulsion;Lingual/palatal residue Oral - Honey Cup: Weak lingual manipulation;Right pocketing in lateral sulci;Left pocketing in lateral sulci;Delayed oral transit;Reduced posterior propulsion;Lingual/palatal residue (sublingual residue) Oral - Nectar Oral - Nectar Teaspoon: Weak lingual manipulation;Right pocketing in lateral sulci;Left pocketing in lateral sulci;Delayed oral transit;Reduced posterior propulsion;Lingual/palatal residue Oral - Nectar Cup: Weak lingual manipulation;Right pocketing in lateral sulci;Left pocketing in lateral sulci;Delayed oral transit;Reduced posterior propulsion;Lingual/palatal residue Oral - Thin Oral - Thin Teaspoon: Weak lingual manipulation;Right pocketing in lateral sulci;Left pocketing in lateral sulci;Delayed oral transit;Reduced posterior propulsion;Lingual/palatal residue Oral - Thin Cup: Weak lingual manipulation;Right pocketing in lateral sulci;Left pocketing in lateral sulci;Delayed oral transit;Reduced posterior propulsion;Lingual/palatal residue Oral - Solids Oral - Puree: Weak lingual manipulation;Right pocketing in lateral sulci;Left pocketing in lateral sulci;Delayed oral transit;Reduced posterior propulsion;Lingual/palatal residue   Pharyngeal Phase Pharyngeal Phase Pharyngeal Phase: Impaired Pharyngeal - Honey Pharyngeal - Honey Teaspoon: Delayed swallow initiation;Premature spillage to valleculae;Premature spillage to pyriform sinuses;Pharyngeal residue - valleculae;Pharyngeal residue - pyriform sinuses;Reduced tongue base retraction;Reduced pharyngeal peristalsis;Reduced laryngeal elevation Pharyngeal - Honey Cup: Delayed swallow initiation;Premature spillage to valleculae;Premature spillage to pyriform sinuses;Pharyngeal residue - valleculae;Pharyngeal residue - pyriform sinuses;Reduced  tongue base retraction;Reduced pharyngeal peristalsis;Reduced laryngeal elevation Pharyngeal - Nectar Pharyngeal - Nectar Teaspoon: Delayed swallow initiation;Premature spillage to valleculae;Premature spillage to pyriform sinuses;Pharyngeal residue - valleculae;Pharyngeal residue - pyriform sinuses;Reduced tongue base retraction;Reduced pharyngeal peristalsis;Reduced laryngeal elevation Pharyngeal - Nectar Cup: Delayed swallow initiation;Premature spillage to valleculae;Premature spillage to pyriform sinuses;Pharyngeal residue - valleculae;Pharyngeal residue - pyriform sinuses;Reduced tongue base retraction;Reduced pharyngeal peristalsis;Reduced laryngeal elevation;Reduced anterior laryngeal mobility;Reduced airway/laryngeal closure;Penetration/Aspiration during swallow Penetration/Aspiration details (nectar cup): Material enters airway, remains ABOVE vocal cords and not ejected out Pharyngeal - Thin Pharyngeal - Thin Teaspoon: Delayed swallow initiation;Premature spillage to valleculae;Premature spillage to pyriform sinuses;Pharyngeal residue - valleculae;Pharyngeal residue - pyriform sinuses;Reduced tongue base retraction;Reduced pharyngeal peristalsis;Reduced laryngeal elevation;Penetration/Aspiration during swallow;Reduced anterior laryngeal mobility;Reduced airway/laryngeal closure Penetration/Aspiration details (thin teaspoon): Material enters airway, remains ABOVE vocal cords and not ejected out Pharyngeal - Thin Cup: Delayed swallow initiation;Premature spillage to valleculae;Premature spillage to pyriform sinuses;Pharyngeal residue - valleculae;Pharyngeal residue - pyriform sinuses;Reduced tongue base retraction;Reduced pharyngeal peristalsis;Reduced laryngeal elevation;Penetration/Aspiration during swallow;Reduced anterior laryngeal mobility;Reduced airway/laryngeal closure;Penetration/Aspiration before swallow;Significant aspiration (Amount) Penetration/Aspiration details (thin cup): Material  enters airway, passes BELOW cords without attempt by patient to eject out (silent aspiration) (slight throat clear) Pharyngeal - Solids Pharyngeal - Puree: Delayed swallow initiation;Pharyngeal residue - valleculae;Pharyngeal residue - pyriform sinuses;Reduced tongue base retraction;Reduced pharyngeal peristalsis;Reduced laryngeal elevation;Premature spillage to valleculae  Cervical Esophageal Phase    GO    Cervical Esophageal Phase Cervical Esophageal Phase: [facility]         Darrow Bussing.Ed [employer] [phone]  09/30/2012

## 2012-09-30 NOTE — Progress Notes (Signed)
Subjective: Interval History: has no complaint of  Says feels good.  Objective: Vital signs in last 24 hours: Temp:  [97.5 F (36.4 C)-98.4 F (36.9 C)] 97.6 F (36.4 C) (04/24 0820) Pulse Rate:  [61-67] 63 (04/24 0820) Resp:  [12-18] 18 (04/24 0820) BP: (121-158)/(62-74) 148/62 mmHg (04/24 0820) SpO2:  [94 %-100 %] 100 % (04/24 0820) Weight:  [89.1 kg (196 lb 6.9 oz)] 89.1 kg (196 lb 6.9 oz) (04/23 2200) Weight change: 0.4 kg (14.1 oz)  Intake/Output from previous day: 04/23 0701 - 04/24 0700 In: 805 [I.V.:450; IV Piggyback:355] Out: 1075 [Urine:1075] Intake/Output this shift:    General appearance: cooperative, slowed mentation and slurred speech Resp: diminished breath sounds bilaterally Cardio: regular rate and rhythm and systolic murmur: systolic ejection 2/6, decrescendo at 2nd left intercostal space GI: pos bs,soft Extremities: extremities normal, atraumatic, no cyanosis or edema  Lab Results:  Recent Labs  09/29/12 0445 09/30/12 0510  WBC 8.9 10.3  HGB 9.7* 10.2*  HCT 27.2* 29.0*  PLT 100* 101*   BMET:  Recent Labs  09/29/12 0445 09/30/12 0510  NA 158* 163*  K 3.5 3.2*  CL 122* 126*  CO2 26 28  GLUCOSE 233* 175*  BUN 153* 126*  CREATININE 4.82* 3.76*  CALCIUM 9.9 10.4    Recent Labs  09/28/12 1746  PTH 156.3*   Iron Studies:  Recent Labs  09/28/12 1746  IRON 22*  TIBC 139*    Studies/Results: No results found.  I have reviewed the patient's current medications.  Assessment/Plan: 1 AKI improving, nonoliguric, vol/k/acid/base ok.  Cont ivf until drinking well.  AVOID ACDI/ARB 2 Sz disorder on meds 3 Anemia 4 hypernatremia ^ free H20 5 Dysphagia swallow eval P IVF, H20, swallow eval.   LOS: 3 days   Jobie Popp L 09/30/2012,10:42 AM

## 2012-09-30 NOTE — Progress Notes (Signed)
Inpatient Diabetes Program Recommendations  AACE/ADA: New Consensus Statement on Inpatient Glycemic Control (2013)  Target Ranges:  Prepandial:   less than 140 mg/dL      Peak postprandial:   less than 180 mg/dL (1-2 hours)      Critically ill patients:  140 - 180 mg/dL   Reason for Visit: Results for Kurt [name], Kurt [name] (MRN [id number]) as of 09/30/2012 13:29  Ref. Range 09/30/2012 04:38 09/30/2012 05:49 09/30/2012 07:52 09/30/2012 12:10  Glucose-Capillary Latest Range: 70-99 mg/dL 65 (L) 829 (H) 562 (H) 148 (H)   Please decrease correction to sensitive tid with meals.  Patient is now off of IV steroids.  May also need to decrease Levemir.

## 2012-09-30 NOTE — Progress Notes (Signed)
[facility] Progress Note [place] TEAM 1 - Stepdown/ICU TEAM   SIGFREDO SCHREIER FAO:[id number] DOB: 04/02/1929 DOA: 09/27/2012 PCP: Nadean Corwin, MD  Brief narrative: 79 year old male patient admitted in late March for seizure disorder exacerbation. Was changed to Keppra and discharged to nursing facility. Per the patient's family he was having increasing difficulty eating over the past week. Over the past 24 hours prior to admission he was unable to eat entirely and was having difficulty speaking. In addition to these symptoms the patient also noticed increased swelling in the submandibular area on the right side that has progressively worsened over 24 hours which prompted the family to bring the patient to the emergency department. In the ER patient was found to have acute renal failure with metabolic acidosis and creatinine greater than 8 which was much higher than his baseline of 1.85 back in March. CT of the neck soft tissue demonstrated possible submandibular infection. ENT physician was consulted. When initially evaluated by the admitting physician the patient was found to be mildly hypotensive and appeared quite dehydrated.   Assessment/Plan:  ARF (acute renal failure) on CKD (chronic kidney disease), stage III -baseline Scr 1.85 -current Scr has trending down with IVF, urine output good,  -acidosis has resolved so given worsening hypernatremia will change 1/2 NS to d5W -suspect ATN due to preadmit ARB plus hypotension due to anti-HTN meds and diuretics -Appreciate Renal consult  Acute Submandibular sialoadenitis -appreciate ENT assistance -completed 3 days of IV steroids and empiric antobiotics, DC vanc, continue IV zosyn today -cont supportive care -ENT ordered FNA but radiology notes need to wait for anbx's treatment to complete first, needs to FU with Dr.Gore in 2 weeks   Dysphagia -due to neck swelling -ST rec NPO on admission -repeat swallow eval now since  neck edema improving  Hypernatremia/Dehydration -change IVF to d5w -repeat BMEt this evening  Hypertension -stable -cont to hold tenormin and HCTZ -avoid ARB (losartan)  Seizure disorder, with left leg weakness (Todds phenomenon) -Cont IV Keppra, change to PO once able to swallow  Diabetes mellitus without complication -hold Metformin and would not resume after dc -cont SSI   DVT prophylaxis: SCDs Code Status: DO NOT RESUSCITATE Family Communication: none at bedside Disposition Plan: SNF in 2-3 days Isolation: None  Consultants: Nephrology ENT  Procedures: None  Antibiotics: Clindamycin 4/22 >>> 4/22 Zosyn 4/22 >>> Vancomycin 4/22 >>>4/24  HPI/Subjective: Patient sleepy this am -confused/without complaints.  Objective: Blood pressure 149/68, pulse 61, temperature 97.5 F (36.4 C), temperature source Axillary, resp. rate 18, height 6' 3.2" (1.91 m), weight 89.1 kg (196 lb 6.9 oz), SpO2 100.00%.  Intake/Output Summary (Last 24 hours) at 09/30/12 0742 Last data filed at 09/30/12 0546  Gross per 24 hour  Intake    805 ml  Output   1075 ml  Net   -270 ml   Exam: General: No acute respiratory distress ENT: R neck swelling improving,  Moist oral mucous membranes , no JVD noted Lungs: Clear to auscultation bilaterally without wheezes or crackles, RA Cardiovascular: Regular rate and rhythm, systolic murmur, no gallop or rub normal S1 and S2 Abdomen: Nontender, nondistended, soft, bowel sounds positive, no rebound, no ascites, no appreciable mass Musculoskeletal: No significant cyanosis, clubbing of bilateral lower extremities Neurological: Alert and oriented x to self only, moves all extremities x 4 without focal neurological deficits  Data Reviewed: Basic Metabolic Panel:  Recent Labs Lab 09/27/12 1640 09/28/12 0540 09/29/12 0445 09/30/12 0510  NA 148* 153* 158* 163*  K 5.8* 4.5 3.5 3.2*  CL 117* 120* 122* 126*  CO2 14* 18* 26 28  GLUCOSE 127* 131* 233*  175*  BUN 205* 189* 153* 126*  CREATININE 8.12* 6.94* 4.82* 3.76*  CALCIUM 10.4 9.6 9.9 10.4  PHOS  --  6.6* 3.9 3.9   Liver Function Tests:  Recent Labs Lab 09/27/12 1640 09/28/12 0540 09/29/12 0445 09/30/12 0510  AST 16 11 10   --   ALT 10 7 6   --   ALKPHOS 52 45 43  --   BILITOT 0.3 0.3 0.3  --   PROT 8.2 6.9 6.8  --   ALBUMIN 3.4* 2.8* 2.8* 2.8*   CBC:  Recent Labs Lab 09/27/12 1640 09/28/12 0540 09/29/12 0445 09/30/12 0510  WBC 15.7* 12.7* 8.9 10.3  NEUTROABS 13.6* 10.9*  --   --   HGB 12.0* 10.0* 9.7* 10.2*  HCT 34.5* 28.2* 27.2* 29.0*  MCV 87.8 86.0 84.2 86.3  PLT 98* 84* 100* 101*   CBG:  Recent Labs Lab 09/29/12 1623 09/29/12 2120 09/30/12 0009 09/30/12 0438 09/30/12 0549  GLUCAP 168* 152* 131* 65* 128*    Recent Results (from the past 240 hour(s))  CULTURE, BLOOD (ROUTINE X 2)     Status: None   Collection Time    09/27/12  8:45 PM      Result Value Range Status   Specimen Description BLOOD LEFT ARM   Final   Special Requests BOTTLES DRAWN AEROBIC AND ANAEROBIC 10CC EACH   Final   Culture  Setup Time 09/28/2012 02:16   Final   Culture     Final   Value:        BLOOD CULTURE RECEIVED NO GROWTH TO DATE CULTURE WILL BE HELD FOR 5 DAYS BEFORE ISSUING A FINAL NEGATIVE REPORT   Report Status PENDING   Incomplete  CULTURE, BLOOD (ROUTINE X 2)     Status: None   Collection Time    09/27/12  8:55 PM      Result Value Range Status   Specimen Description BLOOD RIGHT HAND   Final   Special Requests BOTTLES DRAWN AEROBIC ONLY 5CC   Final   Culture  Setup Time 09/28/2012 02:16   Final   Culture     Final   Value:        BLOOD CULTURE RECEIVED NO GROWTH TO DATE CULTURE WILL BE HELD FOR 5 DAYS BEFORE ISSUING A FINAL NEGATIVE REPORT   Report Status PENDING   Incomplete  MRSA PCR SCREENING     Status: Abnormal   Collection Time    09/28/12 11:34 AM      Result Value Range Status   MRSA by PCR POSITIVE (*) NEGATIVE Final   Comment:            The  GeneXpert MRSA Assay (FDA     approved for NASAL specimens     only), is one component of a     comprehensive MRSA colonization     surveillance program. It is not     intended to diagnose MRSA     infection nor to guide or     monitor treatment for     MRSA infections.     RESULT CALLED TO, READ BACK BY AND VERIFIED WITH:     MATTHEWS,H RN @ 1349 ON 09/28/12 BY LEONARD,A     Studies:  Recent x-ray studies have been reviewed in detail by the Attending Physician  Scheduled Meds: 09/30/2012, 7:42 AM   LOS:  3 days   I have personally examined this patient and reviewed the entire database. I have reviewed the above note, made any necessary editorial changes, and agree with its content.  Zannie Cove, MD [phone] [facility]

## 2012-09-30 NOTE — Progress Notes (Signed)
Speech Language Pathology Dysphagia Treatment Patient Details Name: Kurt [name] MRN: [id number] DOB: 07-29-1928 Today's Date: 09/30/2012 Time: 7829-5621 SLP Time Calculation (min): 16 min  Assessment / Plan / Recommendation Clinical Impression  Treatment included clinician provided trials of puree and honey consistency to aid in treatment plan.  Pt. talking with food in oral cavity but able to stop with max verbal cues.  Swallow initiation approximately 20-30 seconds with applesauce with difficulty determining location of bolus at that time (oral cavity or pharynx).  Decreased palpable laryngeal elevation without s/s aspiration.  Prior to recommending diet, pt. would benefit from an MBS to determine safest and least restrictive diet/liquid consistency.  MBS scheduled for today at 10:30.     Diet Recommendation       SLP Plan MBS   Pertinent Vitals/Pain none   Swallowing Goals  SLP Swallowing Goals Goal #3: Pt will participate in therapeutic po trials with increased secretion management  (coughing, throat clear) with mod verbal cues  Swallow Study Goal #3 - Progress: Met  General Temperature Spikes Noted: No Respiratory Status: Room air Behavior/Cognition: Cooperative;Pleasant mood;Alert;Requires cueing Oral Cavity - Dentition: Edentulous Patient Positioning: Upright in bed  Oral Cavity - Oral Hygiene Does patient have any of the following "at risk" factors?: None of the above Brush patient's teeth BID with toothbrush (using toothpaste with fluoride): Yes Patient is AT RISK - Oral Care Protocol followed (see row info): Yes   Dysphagia Treatment Treatment focused on: Upgraded PO texture trials Treatment Methods/Modalities: Skilled observation;Differential diagnosis Patient observed directly with PO's: Yes Type of PO's observed: Dysphagia 1 (puree);Honey-thick liquids Feeding: Total assist Liquids provided via: Cup;Teaspoon Oral Phase Signs & Symptoms: Prolonged bolus  formation;Prolonged oral phase Pharyngeal Phase Signs & Symptoms: Suspected delayed swallow initiation;Multiple swallows Type of cueing: Verbal Amount of cueing: Maximal   GO     Kurt [name] Kurt [name] M.Ed [employer] [id number]  09/30/2012

## 2012-09-30 NOTE — Clinical Social Work Note (Signed)
CSW talked with Crystal, admissions staff at [facility] regarding paitent. She could not locate patient's clinicals in [facility] and requested they be sent, which was done. Crystal also advised that per MD patient may be ready for d/c on Saturday and they clinicals have been submitted to [facility].  CSW will follow-up with MD to confirm Saturday discharge, and with facility and family to update them on discharge date.  Genelle Bal, MSW, LCSW [phone]

## 2012-09-30 NOTE — Progress Notes (Signed)
CRITICAL VALUE ALERT  Critical value received:  Sodium 161  Date of notification:  09/30/2012  Time of notification:  1658  Critical value read back:yes  Nurse who received alert:  Lovie Macadamia RN  MD notified (1st page):  Tama Gander ( NP)   Time of first page:  1955  MD notified (2nd page):  Time of second page:  Responding MD:  Tama Gander (NP)  Time MD responded:  2019

## 2012-09-30 NOTE — Progress Notes (Signed)
Chaplain Note: Found pt asleep. No family at bedside.  Will follow up tomorrow.  Rutherford Nail Chaplain Resident [phone]

## 2012-10-01 DIAGNOSIS — E860 Dehydration: Secondary | ICD-10-CM

## 2012-10-01 DIAGNOSIS — R4182 Altered mental status, unspecified: Secondary | ICD-10-CM

## 2012-10-01 LAB — CBC
HCT: 29.5 % — ABNORMAL LOW (ref 39.0–52.0)
MCH: 30.2 pg (ref 26.0–34.0)
MCV: 88.3 fL (ref 78.0–100.0)
Platelets: 101 10*3/uL — ABNORMAL LOW (ref 150–400)
RBC: 3.34 MIL/uL — ABNORMAL LOW (ref 4.22–5.81)
RDW: 14.2 % (ref 11.5–15.5)
WBC: 6.7 10*3/uL (ref 4.0–10.5)

## 2012-10-01 LAB — RENAL FUNCTION PANEL
Albumin: 2.7 g/dL — ABNORMAL LOW (ref 3.5–5.2)
BUN: 93 mg/dL — ABNORMAL HIGH (ref 6–23)
Calcium: 10.2 mg/dL (ref 8.4–10.5)
Creatinine, Ser: 2.88 mg/dL — ABNORMAL HIGH (ref 0.50–1.35)
GFR calc non Af Amer: 19 mL/min — ABNORMAL LOW (ref >90)

## 2012-10-01 LAB — GLUCOSE, CAPILLARY
Glucose-Capillary: 89 mg/dL (ref 70–99)
Glucose-Capillary: 142 mg/dL — ABNORMAL HIGH (ref 70–99)
Glucose-Capillary: 136 mg/dL — ABNORMAL HIGH (ref 70–99)

## 2012-10-01 MED ORDER — HALOPERIDOL LACTATE 5 MG/ML IJ SOLN
5.0000 mg | Freq: Once | INTRAMUSCULAR | Status: AC
  Administered 2012-10-01: 5 mg via INTRAVENOUS
  Filled 2012-10-01: qty 1

## 2012-10-01 MED ORDER — DEXTROSE 5 % IV SOLN
INTRAVENOUS | Status: DC
  Administered 2012-10-01 – 2012-10-07 (×9): via INTRAVENOUS

## 2012-10-01 MED ORDER — AMOXICILLIN-POT CLAVULANATE 250-125 MG PO TABS
1.0000 | ORAL_TABLET | Freq: Two times a day (BID) | ORAL | Status: DC
  Administered 2012-10-01 – 2012-10-03 (×6): 1 via ORAL
  Filled 2012-10-01 (×8): qty 1

## 2012-10-01 NOTE — Progress Notes (Signed)
[facility] Progress Note   Kurt [name] RUE:[id number] DOB: 1928-09-01 DOA: 09/27/2012 PCP: Nadean Corwin, MD  Brief narrative: 79 year old male patient admitted in late March for seizure disorder exacerbation. Was changed to Keppra and discharged to nursing facility. Per the patient's family he was having increasing difficulty eating over the past week. Over the past 24 hours prior to admission he was unable to eat entirely and was having difficulty speaking. In addition to these symptoms the patient also noticed increased swelling in the submandibular area on the right side that has progressively worsened over 24 hours which prompted the family to bring the patient to the emergency department. In the ER patient was found to have acute renal failure with metabolic acidosis and creatinine greater than 8 which was much higher than his baseline of 1.85 back in March. CT of the neck soft tissue demonstrated possible submandibular infection. ENT physician was consulted. When initially evaluated by the admitting physician the patient was found to be mildly hypotensive and appeared quite dehydrated.   Assessment/Plan:  ARF (acute renal failure) on CKD (chronic kidney disease), stage III -baseline Scr 1.85 -current Scr has trending down with IVF, urine output good,  -suspect ATN due to preadmit ARB plus hypotension due to anti-HTN meds and diuretics -Appreciate Renal consult, IVF changed to d5W today  Acute Submandibular sialoadenitis -appreciate ENT assistance -completed 3 days of IV steroids and empiric antibiotics IV  vanc and zosyn for 4 days,  -change to Po Augmentin today for 3-4 more days -cont supportive care -ENT ordered FNA but radiology notes need to wait for anbx's treatment to complete first, needs to FU with Dr.Gore in 2 weeks   Dysphagia: Likely long standing, but not been formally accessed before -s/p swallow eval -Started on D1 Diet with thick liquids -PO intake  very poor to none  Hypernatremia/Dehydration -changed IVF to d5w this am -bmet in am  Hypertension -stable -cont to hold tenormin and HCTZ -avoid ARB (losartan)  Seizure disorder, with left leg weakness (Todds phenomenon) -Cont IV Keppra, change to PO once able to swallow  Diabetes mellitus without complication -hold Metformin and would not resume after dc -cont SSI  DVT prophylaxis: SCDs  Code Status: DO NOT RESUSCITATE Family Communication: extended family at bedside Disposition Plan: SNF early next week   Consultants: Nephrology ENT  Procedures: None  Antibiotics: Clindamycin 4/22 >>> 4/22 Zosyn 4/22 >>> Vancomycin 4/22 >>>4/24  HPI/Subjective: Patient sleepy this am -confused/without complaints. Didn't eat anything   Objective: Blood pressure 148/68, pulse 62, temperature 98.6 F (37 C), temperature source Oral, resp. rate 18, height 6' 3.2" (1.91 m), weight 88.9 kg (195 lb 15.8 oz), SpO2 100.00%.  Intake/Output Summary (Last 24 hours) at 10/01/12 1237 Last data filed at 10/01/12 0900  Gross per 24 hour  Intake   1865 ml  Output   1225 ml  Net    640 ml   Exam: General: No acute respiratory distress ENT: R neck swelling improving,  Moist oral mucous membranes , no JVD noted Lungs: Clear to auscultation bilaterally without wheezes or crackles, RA Cardiovascular: Regular rate and rhythm, systolic murmur, no gallop or rub normal S1 and S2 Abdomen: Nontender, nondistended, soft, bowel sounds positive, no rebound, no ascites, no appreciable mass Musculoskeletal: No significant cyanosis, clubbing of bilateral lower extremities Neurological: Alert and oriented x to self only, moves all extremities x 4 without focal neurological deficits  Data Reviewed: Basic Metabolic Panel:  Recent Labs Lab 09/27/12 1640 09/28/12 0540 09/29/12  0445 09/30/12 0510 09/30/12 1555 10/01/12 0630  NA 148* 153* 158* 163* 161* 163*  K 5.8* 4.5 3.5 3.2* 2.9* 3.7  CL 117*  120* 122* 126* 124* 129*  CO2 14* 18* 26 28 27 27   GLUCOSE 127* 131* 233* 175* 144* 120*  BUN 205* 189* 153* 126* 111* 93*  CREATININE 8.12* 6.94* 4.82* 3.76* 3.27* 2.88*  CALCIUM 10.4 9.6 9.9 10.4 10.2 10.2  PHOS  --  6.6* 3.9 3.9  --  3.2   Liver Function Tests:  Recent Labs Lab 09/27/12 1640 09/28/12 0540 09/29/12 0445 09/30/12 0510 10/01/12 0630  AST 16 11 10   --   --   ALT 10 7 6   --   --   ALKPHOS 52 45 43  --   --   BILITOT 0.3 0.3 0.3  --   --   PROT 8.2 6.9 6.8  --   --   ALBUMIN 3.4* 2.8* 2.8* 2.8* 2.7*   CBC:  Recent Labs Lab 09/27/12 1640 09/28/12 0540 09/29/12 0445 09/30/12 0510 10/01/12 0630  WBC 15.7* 12.7* 8.9 10.3 6.7  NEUTROABS 13.6* 10.9*  --   --   --   HGB 12.0* 10.0* 9.7* 10.2* 10.1*  HCT 34.5* 28.2* 27.2* 29.0* 29.5*  MCV 87.8 86.0 84.2 86.3 88.3  PLT 98* 84* 100* 101* 101*   CBG:  Recent Labs Lab 09/30/12 1630 09/30/12 2030 10/01/12 0008 10/01/12 0409 10/01/12 0753  GLUCAP 139* 134* 82 106* 89    Recent Results (from the past 240 hour(s))  CULTURE, BLOOD (ROUTINE X 2)     Status: None   Collection Time    09/27/12  8:45 PM      Result Value Range Status   Specimen Description BLOOD LEFT ARM   Final   Special Requests BOTTLES DRAWN AEROBIC AND ANAEROBIC 10CC EACH   Final   Culture  Setup Time 09/28/2012 02:16   Final   Culture     Final   Value:        BLOOD CULTURE RECEIVED NO GROWTH TO DATE CULTURE WILL BE HELD FOR 5 DAYS BEFORE ISSUING A FINAL NEGATIVE REPORT   Report Status PENDING   Incomplete  CULTURE, BLOOD (ROUTINE X 2)     Status: None   Collection Time    09/27/12  8:55 PM      Result Value Range Status   Specimen Description BLOOD RIGHT HAND   Final   Special Requests BOTTLES DRAWN AEROBIC ONLY 5CC   Final   Culture  Setup Time 09/28/2012 02:16   Final   Culture     Final   Value:        BLOOD CULTURE RECEIVED NO GROWTH TO DATE CULTURE WILL BE HELD FOR 5 DAYS BEFORE ISSUING A FINAL NEGATIVE REPORT   Report  Status PENDING   Incomplete  MRSA PCR SCREENING     Status: Abnormal   Collection Time    09/28/12 11:34 AM      Result Value Range Status   MRSA by PCR POSITIVE (*) NEGATIVE Final   Comment:            The GeneXpert MRSA Assay (FDA     approved for NASAL specimens     only), is one component of a     comprehensive MRSA colonization     surveillance program. It is not     intended to diagnose MRSA     infection nor to guide or  monitor treatment for     MRSA infections.     RESULT CALLED TO, READ BACK BY AND VERIFIED WITH:     MATTHEWS,H RN @ 1349 ON 09/28/12 BY LEONARD,A     Studies:  Recent x-ray studies have been reviewed in detail by the Attending Physician  Scheduled Meds: 10/01/2012, 12:37 PM   LOS: 4 days   I have personally examined this patient and reviewed the entire database. I have reviewed the above note, made any necessary editorial changes, and agree with its content.  Zannie Cove, MD [phone] [facility]

## 2012-10-01 NOTE — Progress Notes (Signed)
CRITICAL VALUE ALERT  Critical value received:  NA 163  Date of notification:  10/01/12  Time of notification:  0747  Critical value read back:yes  Nurse who received alert:  Morrie Sheldon   MD notified (1st page):  Jomarie Longs  Time of first page:  [phone]  MD notified (2nd page):  Time of second page:  Responding MD:  Conan Bowens  Time MD responded:  [phone]

## 2012-10-01 NOTE — Progress Notes (Signed)
Patients restraints discontinued at 1000. Patient calm, following commands. Will continue to monitor.

## 2012-10-01 NOTE — Progress Notes (Signed)
Utilization Review Completed.   Joelly Bolanos, RN, BSN Nurse Case Manager  [phone]  

## 2012-10-01 NOTE — Progress Notes (Signed)
Subjective: Interval History: none.  Objective: Vital signs in last 24 hours: Temp:  [97.3 F (36.3 C)-98.4 F (36.9 C)] 97.6 F (36.4 C) (04/25 0521) Pulse Rate:  [58-98] 58 (04/25 0521) Resp:  [18] 18 (04/25 0521) BP: (133-144)/(58-71) 144/71 mmHg (04/25 0521) SpO2:  [100 %] 100 % (04/25 0521) Weight:  [88.9 kg (195 lb 15.8 oz)] 88.9 kg (195 lb 15.8 oz) (04/24 2031) Weight change: -0.2 kg (-7.1 oz)  Intake/Output from previous day: 04/24 0701 - 04/25 0700 In: 1940 [P.O.:60; I.V.:1125; IV Piggyback:755] Out: 1225 [Urine:1225] Intake/Output this shift:    General appearance: cooperative and slowed mentation Resp: diminished breath sounds bilaterally Cardio: S1, S2 normal and systolic murmur: holosystolic 2/6, blowing at apex GI: pos bs, liver down 4 cm Extremities: extremities normal, atraumatic, no cyanosis or edema  Lab Results:  Recent Labs  09/30/12 0510 10/01/12 0630  WBC 10.3 6.7  HGB 10.2* 10.1*  HCT 29.0* 29.5*  PLT 101* 101*   BMET:  Recent Labs  09/30/12 1555 10/01/12 0630  NA 161* 163*  K 2.9* 3.7  CL 124* 129*  CO2 27 27  GLUCOSE 144* 120*  BUN 111* 93*  CREATININE 3.27* 2.88*  CALCIUM 10.2 10.2    Recent Labs  09/28/12 1746  PTH 156.3*   Iron Studies:  Recent Labs  09/28/12 1746  IRON 22*  TIBC 139*    Studies/Results: Dg Swallowing Func-speech Pathology  09/30/2012  [name] [place], CCC-SLP     09/30/2012 12:50 PM Objective Swallowing Evaluation: Modified Barium Swallowing Study   Patient Details  Name: Kurt [name] MRN: [id number] Date of Birth: 1928/09/22  Today's Date: 09/30/2012 Time: 1110-1135 SLP Time Calculation (min): 25 min  Past Medical History:  Past Medical History  Diagnosis Date  . Hypertension   . Hyperlipidemia   . Seizure disorder   . CKD (chronic kidney disease), stage III   . Altered mental state 08/31/2012    "came into hospital a little disoriented; it's gotten worse;  this is not normal" (08/31/2012)  .  Seizures   . Type II diabetes mellitus   . Chronic lower back pain    Past Surgical History:  Past Surgical History  Procedure Laterality Date  . Brain tumor excision    . 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 male  with acute renal failure and  right neck swelling. Pt c/o difficulty swallowing for past week.  CT neck without contrast showed hypotrophic left 9mm  submandibular gland and right homogeneous 2x3 cm submandibular  gland without obvious mass or abscess and surrounding soft tissue  stranding likely submandibular sialoadenitis. Pt started on  antibiotics for treatment of mass.  Bedside swallow assessment  recommended MBS.     Assessment / Plan / Recommendation Clinical Impression  Dysphagia Diagnosis: Severe oral phase dysphagia;Moderate  pharyngeal phase dysphagia;Severe pharyngeal phase dysphagia Clinical impression: Pt. demonstrated severe oral dysphagia with  poor ability to form bolus, maintain cohesion leading to residue  throughout oral cavity and transit delays.  Pharyngeal phase  includes moderate-severe impairments both sensory and motor  tracts affected.  Delayed swallow initiation, reduced pharyngeal  contraction. decreased laryngeal elevation and epiglottic closure  led to moderate-severe vallecular and moderate pyriform sinus  residue throughout study.  Frank aspiration of thin before and  during swallow (basically silent with one minimal throat clear).   Pt. does not sense majority of residue and was unable to  effectively swallow additional times  with therapeutic  intervention via verbal/visual cues (several times a reflexive  2nd swallow performed and cleared mild amount).  Unfortunately at  this time he aspiration does not appear preventable with any  diet/liquid modifications.  Pt. confused with dementia and unable  to perform compensatory techniques.  Dys 1 and nectar thick may  have the least potential to penetrate/aspirate, however not  guaranteed.   Discussed with Dr. Jomarie Longs and explained clinical  findings and results of swallow assessments with precautions to  pt.'s family (mom has dementia, 2 daughters present).  ST will  continue to follow for education.       Treatment Recommendation  Therapy as outlined in treatment plan below    Diet Recommendation Dysphagia 1 (Puree);Nectar-thick liquid   Liquid Administration via: Cup;No straw Medication Administration: Crushed with puree Supervision: Full supervision/cueing for compensatory strategies Compensations: Slow rate;Small sips/bites;Check for  pocketing;Multiple dry swallows after each bite/sip Postural Changes and/or Swallow Maneuvers: Seated upright 90  degrees    Other  Recommendations Oral Care Recommendations: Oral care QID   Follow Up Recommendations   (to be determined)    Frequency and Duration min 2x/week  2 weeks   Pertinent Vitals/Pain     SLP Swallow Goals Patient will utilize recommended strategies during swallow to  increase swallowing safety with: Maximal cueing Goal #3: Pt will participate in therapeutic po trials with  increased secretion management  (coughing, throat clear) with mod  verbal cues  Swallow Study Goal #3 - Progress: Met Goal #4: Family will verbalize understanding of swallow  precautions with min cues.      Reason for Referral Objectively evaluate swallowing function   Oral Phase Oral Preparation/Oral Phase Oral Phase: Impaired Oral - Honey Oral - Honey Teaspoon: Weak lingual manipulation;Right pocketing  in lateral sulci;Left pocketing in lateral sulci;Delayed oral  transit;Reduced posterior propulsion;Lingual/palatal residue Oral - Honey Cup: Weak lingual manipulation;Right pocketing in  lateral sulci;Left pocketing in lateral sulci;Delayed oral  transit;Reduced posterior propulsion;Lingual/palatal residue  (sublingual residue) Oral - Nectar Oral - Nectar Teaspoon: Weak lingual manipulation;Right pocketing  in lateral sulci;Left pocketing in lateral sulci;Delayed oral   transit;Reduced posterior propulsion;Lingual/palatal residue Oral - Nectar Cup: Weak lingual manipulation;Right pocketing in  lateral sulci;Left pocketing in lateral sulci;Delayed oral  transit;Reduced posterior propulsion;Lingual/palatal residue Oral - Thin Oral - Thin Teaspoon: Weak lingual manipulation;Right pocketing  in lateral sulci;Left pocketing in lateral sulci;Delayed oral  transit;Reduced posterior propulsion;Lingual/palatal residue Oral - Thin Cup: Weak lingual manipulation;Right pocketing in  lateral sulci;Left pocketing in lateral sulci;Delayed oral  transit;Reduced posterior propulsion;Lingual/palatal residue Oral - Solids Oral - Puree: Weak lingual manipulation;Right pocketing in  lateral sulci;Left pocketing in lateral sulci;Delayed oral  transit;Reduced posterior propulsion;Lingual/palatal residue   Pharyngeal Phase Pharyngeal Phase Pharyngeal Phase: Impaired Pharyngeal - Honey Pharyngeal - Honey Teaspoon: Delayed swallow initiation;Premature  spillage to valleculae;Premature spillage to pyriform  sinuses;Pharyngeal residue - valleculae;Pharyngeal residue -  pyriform sinuses;Reduced tongue base retraction;Reduced  pharyngeal peristalsis;Reduced laryngeal elevation Pharyngeal - Honey Cup: Delayed swallow initiation;Premature  spillage to valleculae;Premature spillage to pyriform  sinuses;Pharyngeal residue - valleculae;Pharyngeal residue -  pyriform sinuses;Reduced tongue base retraction;Reduced  pharyngeal peristalsis;Reduced laryngeal elevation Pharyngeal - Nectar Pharyngeal - Nectar Teaspoon: Delayed swallow  initiation;Premature spillage to valleculae;Premature spillage to  pyriform sinuses;Pharyngeal residue - valleculae;Pharyngeal  residue - pyriform sinuses;Reduced tongue base retraction;Reduced  pharyngeal peristalsis;Reduced laryngeal elevation Pharyngeal - Nectar Cup: Delayed swallow initiation;Premature  spillage to valleculae;Premature spillage to pyriform  sinuses;Pharyngeal residue -  valleculae;Pharyngeal residue -  pyriform sinuses;Reduced tongue base  retraction;Reduced  pharyngeal peristalsis;Reduced laryngeal elevation;Reduced  anterior laryngeal mobility;Reduced airway/laryngeal  closure;Penetration/Aspiration during swallow Penetration/Aspiration details (nectar cup): Material enters  airway, remains ABOVE vocal cords and not ejected out Pharyngeal - Thin Pharyngeal - Thin Teaspoon: Delayed swallow initiation;Premature  spillage to valleculae;Premature spillage to pyriform  sinuses;Pharyngeal residue - valleculae;Pharyngeal residue -  pyriform sinuses;Reduced tongue base retraction;Reduced  pharyngeal peristalsis;Reduced laryngeal  elevation;Penetration/Aspiration during swallow;Reduced anterior  laryngeal mobility;Reduced airway/laryngeal closure Penetration/Aspiration details (thin teaspoon): Material enters  airway, remains ABOVE vocal cords and not ejected out Pharyngeal - Thin Cup: Delayed swallow initiation;Premature  spillage to valleculae;Premature spillage to pyriform  sinuses;Pharyngeal residue - valleculae;Pharyngeal residue -  pyriform sinuses;Reduced tongue base retraction;Reduced  pharyngeal peristalsis;Reduced laryngeal  elevation;Penetration/Aspiration during swallow;Reduced anterior  laryngeal mobility;Reduced airway/laryngeal  closure;Penetration/Aspiration before swallow;Significant  aspiration (Amount) Penetration/Aspiration details (thin cup): Material enters  airway, passes BELOW cords without attempt by patient to eject  out (silent aspiration) (slight throat clear) Pharyngeal - Solids Pharyngeal - Puree: Delayed swallow initiation;Pharyngeal residue  - valleculae;Pharyngeal residue - pyriform sinuses;Reduced tongue  base retraction;Reduced pharyngeal peristalsis;Reduced laryngeal  elevation;Premature spillage to valleculae  Cervical Esophageal Phase    GO    Cervical Esophageal Phase Cervical Esophageal Phase: [facility]         Darrow Bussing.Ed CCC-SLP Pager  [phone]  09/30/2012      I have reviewed the patient's current medications.  Assessment/Plan: 1 AKI slowly better.  Water deficit, need to follow and see if some element of DI.  Acid base ok . No po intake worrisome 2 AMS not as alert and not able to sustain 3 ^ SNa as above. 4 Sz disorder 5 HTN not an issue P D%W, encourage po , follow Cr , if cont check Osm    LOS: 4 days   Jeffrey Graefe L 10/01/2012,9:54 AM

## 2012-10-01 NOTE — Progress Notes (Signed)
Physical Therapy Treatment Patient Details Name: Kurt [name] MRN: [id number] DOB: 11-24-1928 Today's Date: 10/01/2012 Time: 4098-1191 PT Time Calculation (min): 33 min  PT Assessment / Plan / Recommendation Comments on Treatment Session  Pt stood for 5 + minutes at side of bed while PT cleaned stool from pts bottom, legs and hands.      Follow Up Recommendations  SNF;Supervision/Assistance - 24 hour     Does the patient have the potential to tolerate intense rehabilitation     Barriers to Discharge        Equipment Recommendations  None recommended by PT    Recommendations for Other Services    Frequency Min 3X/week   Plan Discharge plan remains appropriate;Frequency remains appropriate    Precautions / Restrictions Precautions Precautions: Fall Precaution Comments: history of falling.   Restrictions Weight Bearing Restrictions: No   Pertinent Vitals/Pain No pain    Mobility  Bed Mobility Bed Mobility: Rolling Right;Rolling Left;Scooting to HOB Rolling Right: With rail;3: Mod assist Rolling Left: 4: Min assist;With rail Supine to Sit: 3: Mod assist;HOB flat;With rails Sit to Supine: 3: Mod assist;HOB flat Scooting to HOB: 1: +2 Total assist Scooting to [facility]: Patient Percentage: 0% Details for Bed Mobility Assistance: Assist to raise shoulders from bed and asist to raise LEs into bed. Verbal cues for technique.  Transfers Transfers: Sit to Stand;Stand to Sit Sit to Stand: 3: Mod assist;From bed;With upper extremity assist;From elevated surface;From chair/3-in-1 Sit to Stand: Patient Percentage: 60% Stand to Sit: 3: Mod assist;To chair/3-in-1;To bed;With upper extremity assist Stand to Sit: Patient Percentage: 70% Details for Transfer Assistance: VCs for technique. Assist to initiate standing and control descent for sitting  Ambulation/Gait Ambulation/Gait Assistance: 2: Max assist Ambulation Distance (Feet): 5 Feet (5 feet x 2) Assistive device: Rolling  walker Ambulation/Gait Assistance Details: Assist to manage RW, VCs and tactile cues for upright trunk posture, and proper usage of RW>   Gait Pattern: Step-to pattern;Decreased stride length;Decreased hip/knee flexion - right;Decreased hip/knee flexion - left;Trunk flexed General Gait Details: gait is labored and ataxic.   Stairs: No Wheelchair Mobility Wheelchair Mobility: No    Exercises     PT Diagnosis:    PT Problem List:   PT Treatment Interventions:     PT Goals Acute Rehab PT Goals PT Goal Formulation: With patient/family Time For Goal Achievement: 10/14/12 Potential to Achieve Goals: Fair Pt will go Supine/Side to Sit: with modified independence PT Goal: Supine/Side to Sit - Progress: Progressing toward goal Pt will go Sit to Supine/Side: with modified independence;with HOB 0 degrees PT Goal: Sit to Supine/Side - Progress: Progressing toward goal Pt will go Sit to Stand: with supervision PT Goal: Sit to Stand - Progress: Progressing toward goal Pt will go Stand to Sit: with supervision PT Goal: Stand to Sit - Progress: Progressing toward goal Pt will Transfer Bed to Chair/Chair to Bed: with supervision PT Transfer Goal: Bed to Chair/Chair to Bed - Progress: Progressing toward goal Pt will Ambulate: 16 - 50 feet;with min assist;with rolling walker PT Goal: Ambulate - Progress: Progressing toward goal  Visit Information  Last PT Received On: 10/01/12 Assistance Needed: +2    Subjective Data  Subjective: No new c/o   Cognition  Cognition Arousal/Alertness: Awake/alert Behavior During Therapy: WFL for tasks assessed/performed Overall Cognitive Status: Impaired/Different from baseline Area of Impairment: Orientation;Following commands;Safety/judgement Orientation Level: Disoriented to;Place;Time;Situation Following Commands: Follows one step commands inconsistently;Follows one step commands with increased time Safety/Judgement: Decreased awareness of  safety General  Comments: At times trying to come up OOB    Balance  Balance Balance Assessed: Yes Static Standing Balance Static Standing - Balance Support: Bilateral upper extremity supported Static Standing - Level of Assistance: 4: Min assist Static Standing - Comment/# of Minutes: Pt stood several minutes with min guard assist for safety.    End of Session PT - End of Session Equipment Utilized During Treatment: Gait belt Activity Tolerance: Patient tolerated treatment well Patient left: in bed;with call bell/phone within reach;with bed alarm set;with family/visitor present Nurse Communication: Mobility status   GP     Clearance Chenault 10/01/2012, 4:06 PM Damien Batty L. Aamilah Augenstein DPT [phone]

## 2012-10-01 NOTE — Evaluation (Signed)
Occupational Therapy Evaluation Patient Details Name: Kurt [name] MRN: [id number] DOB: 1929/05/19 Today's Date: 10/01/2012 Time: 6962-9528 OT Time Calculation (min): 22 min  OT Assessment / Plan / Recommendation Clinical Impression  This 79 yo male admitted with difficulty swallowing found to have acute submandibular sialoadenitis, ARF presents to acute OT with problems below. Will benefit from acute OT with follow up at SNF.    OT Assessment  Patient needs continued OT Services    Follow Up Recommendations  SNF    Barriers to Discharge Decreased caregiver support    Equipment Recommendations  None recommended by OT       Frequency  Min 2X/week    Precautions / Restrictions Precautions Precautions: Fall Precaution Comments: history of falling.   Restrictions Weight Bearing Restrictions: No       ADL  Eating/Feeding: Simulated;+1 Total assistance (due to swallowing precautions) Where Assessed - Eating/Feeding: Bed level Grooming: +1 Total assistance;Performed (Not following commands to put teeth in) Where Assessed - Grooming: Supine, head of bed up Upper Body Bathing: Simulated;+1 Total assistance Where Assessed - Upper Body Bathing: Supine, head of bed up;Supine, head of bed flat;Rolling right and/or left Lower Body Bathing: Simulated;+1 Total assistance Where Assessed - Lower Body Bathing: Supine, head of bed up;Supine, head of bed flat;Rolling right and/or left Upper Body Dressing: Simulated;+1 Total assistance Where Assessed - Upper Body Dressing: Supine, head of bed flat;Rolling right and/or left;Supine, head of bed up Lower Body Dressing: Simulated;+1 Total assistance Where Assessed - Lower Body Dressing: Supine, head of bed up;Supine, head of bed flat;Rolling right and/or left    OT Diagnosis: Generalized weakness;Cognitive deficits  OT Problem List: Decreased strength;Decreased cognition;Impaired balance (sitting and/or standing) OT Treatment Interventions:  Self-care/ADL training;Therapeutic activities;DME and/or AE instruction;Patient/family education;Balance training;Cognitive remediation/compensation   OT Goals Acute Rehab OT Goals OT Goal Formulation: Patient unable to participate in goal setting Time For Goal Achievement: 10/15/12 Potential to Achieve Goals: Good ADL Goals Pt Will Perform Grooming: with set-up;with supervision;Unsupported;Sitting, edge of bed;Sitting, chair (one task) ADL Goal: Grooming - Progress: Goal set today Miscellaneous OT Goals Miscellaneous OT Goal #1: Pt will roll left with S to A with BADLs. OT Goal: Miscellaneous Goal #1 - Progress: Goal set today Miscellaneous OT Goal #2: Pt will roll rigth with S to A with BADLs. OT Goal: Miscellaneous Goal #2 - Progress: Goal set today Miscellaneous OT Goal #3: Pt will be able to come up to sit with min A for BADLS and transfers. OT Goal: Miscellaneous Goal #3 - Progress: Goal set today Miscellaneous OT Goal #4: Pt will be able to stand with Mod before transfer to his right or left OT Goal: Miscellaneous Goal #4 - Progress: Goal set today  Visit Information  Last OT Received On: 10/01/12 Assistance Needed: +2    Subjective Data  Subjective: I guess I am alright   Prior Functioning     Home Living Lives With: Spouse Available Help at Discharge: Family;Available 24 hours/day Type of Home: House Home Access: Stairs to enter [employer] of Steps: 2 Entrance Stairs-Rails: None Home Layout: One level Bathroom Shower/Tub: Tub/shower unit;Curtain Home Adaptive Equipment: Straight cane Prior Function Level of Independence: Independent Able to Take Stairs?: Yes Driving: No Vocation: Retired Musician: Expressive difficulties;HOH Dominant Hand: Right         Vision/Perception Vision - History Baseline Vision: Wears glasses all the time (per pt, do not see in room, ? correct info)   Cognition  Cognition Arousal/Alertness:  Awake/alert Behavior  During Therapy: Restless Overall Cognitive Status: Impaired/Different from baseline Area of Impairment: Orientation;Following commands;Safety/judgement Orientation Level: Disoriented to;Place;Time;Situation Following Commands: Follows one step commands inconsistently;Follows one step commands with increased time Safety/Judgement: Decreased awareness of safety General Comments: At times trying to come up OOB    Extremity/Trunk Assessment Right Upper Extremity Assessment RUE ROM/Strength/Tone: Within functional levels (spontaneously, on command resists ) Left Upper Extremity Assessment LUE ROM/Strength/Tone: Within functional levels (spontaneously, on command resists )     Mobility Bed Mobility Bed Mobility: Rolling Right;Rolling Left;Scooting to [facility] Rolling Right: With rail;3: Mod assist Rolling Left: 4: Min assist;With rail Scooting to HOB: 1: +2 Total assist Scooting to [facility]: Patient Percentage: 0%           End of Session OT - End of Session Activity Tolerance: Patient tolerated treatment well Patient left: in bed;with bed alarm set       Evette Georges [phone] 10/01/2012, 3:09 PM

## 2012-10-01 NOTE — Progress Notes (Signed)
Speech Language Pathology Dysphagia Treatment Patient Details Name: Kurt [name] MRN: [id number] DOB: 07-08-1928 Today's Date: 10/01/2012 Time: 1210-1225 SLP Time Calculation (min): 15 min  Assessment / Plan / Recommendation Clinical Impression  F/u after yesterday's MBS.  Pt eating pudding with assist of wife; daughter present.  Consuming very little overall.  Reviewed results and recs from yesterday's study, particularly pt's risk for aspiration despite diet modifications and precautions.  Today, pt presents with persisting clinical symptoms of likely intermittent penetration/aspiration, marked by wet phonation, palpable congestion at level of larynx.  Required max verbal/tactile cues for effortful swallow, throat-clearing, and strong cough post-swallow.  Pt consumed purees/nectar-thicks with encouragement but minimal intake.  Daughter expressing concern; understands risks associated with diet.  Will follow for toleration.    Diet Recommendation  Continue with Current Diet: Dysphagia 1 (puree);Nectar-thick liquid    SLP Plan Continue with current plan of care   Pertinent Vitals/Pain No pain   Swallowing Goals  SLP Swallowing Goals Patient will utilize recommended strategies during swallow to increase swallowing safety with: Maximal cueing Swallow Study Goal #2 - Progress: Progressing toward goal Goal #4: Family will verbalize understanding of swallow precautions with min cues. Swallow Study Goal #4 - Progress: Partly met  General Temperature Spikes Noted: No Respiratory Status: Room air Behavior/Cognition: Cooperative;Pleasant mood;Alert;Requires cueing Oral Cavity - Dentition: Edentulous Patient Positioning: Upright in bed  Oral Cavity - Oral Hygiene Does patient have any of the following "at risk" factors?: Other - dysphagia;Diet - patient on thickened liquids;Nutritional status - dependent feeder   Dysphagia Treatment Treatment focused on: Skilled observation of diet  tolerance;Patient/family/caregiver education;Facilitation of pharyngeal phase;Utilization of compensatory strategies Family/Caregiver Educated: daughter Treatment Methods/Modalities: Skilled observation;Effortful swallow Patient observed directly with PO's: Yes Type of PO's observed: Dysphagia 1 (puree);Nectar-thick liquids Feeding: Needs assist Liquids provided via: Cup Oral Phase Signs & Symptoms: Prolonged bolus formation;Prolonged oral phase Pharyngeal Phase Signs & Symptoms: Suspected delayed swallow initiation;Multiple swallows;Wet vocal quality Type of cueing: Verbal Amount of cueing: Maximal   GO    Tyaisha Cullom L. Samson Frederic, [place] CCC/SLP Pager [phone]  Blenda Mounts Laurice 10/01/2012, 12:53 PM

## 2012-10-02 DIAGNOSIS — F0390 Unspecified dementia without behavioral disturbance: Secondary | ICD-10-CM | POA: Diagnosis present

## 2012-10-02 DIAGNOSIS — G40909 Epilepsy, unspecified, not intractable, without status epilepticus: Secondary | ICD-10-CM

## 2012-10-02 LAB — RENAL FUNCTION PANEL
CO2: 28 mEq/L (ref 19–32)
Chloride: 127 mEq/L — ABNORMAL HIGH (ref 96–112)
GFR calc Af Amer: 28 mL/min — ABNORMAL LOW (ref >90)
GFR calc non Af Amer: 24 mL/min — ABNORMAL LOW (ref >90)
Glucose, Bld: 102 mg/dL — ABNORMAL HIGH (ref 70–99)
Potassium: 3.4 mEq/L — ABNORMAL LOW (ref 3.5–5.1)
Sodium: 163 mEq/L (ref 135–145)

## 2012-10-02 LAB — GLUCOSE, CAPILLARY
Glucose-Capillary: 108 mg/dL — ABNORMAL HIGH (ref 70–99)
Glucose-Capillary: 193 mg/dL — ABNORMAL HIGH (ref 70–99)
Glucose-Capillary: 81 mg/dL (ref 70–99)
Glucose-Capillary: 95 mg/dL (ref 70–99)

## 2012-10-02 LAB — CBC
Hemoglobin: 10.6 g/dL — ABNORMAL LOW (ref 13.0–17.0)
MCH: 30.2 pg (ref 26.0–34.0)
Platelets: 98 10*3/uL — ABNORMAL LOW (ref 150–400)
RBC: 3.51 MIL/uL — ABNORMAL LOW (ref 4.22–5.81)
WBC: 5.5 10*3/uL (ref 4.0–10.5)

## 2012-10-02 MED ORDER — POTASSIUM CHLORIDE CRYS ER 20 MEQ PO TBCR
40.0000 meq | EXTENDED_RELEASE_TABLET | Freq: Once | ORAL | Status: AC
  Administered 2012-10-02: 40 meq via ORAL
  Filled 2012-10-02: qty 2

## 2012-10-02 MED ORDER — INSULIN ASPART 100 UNIT/ML ~~LOC~~ SOLN
0.0000 [IU] | Freq: Three times a day (TID) | SUBCUTANEOUS | Status: DC
  Administered 2012-10-02 – 2012-10-03 (×2): 2 [IU] via SUBCUTANEOUS
  Administered 2012-10-05 – 2012-10-07 (×2): 1 [IU] via SUBCUTANEOUS

## 2012-10-02 MED ORDER — LEVETIRACETAM 500 MG PO TABS
500.0000 mg | ORAL_TABLET | Freq: Two times a day (BID) | ORAL | Status: DC
  Administered 2012-10-02 – 2012-10-07 (×11): 500 mg via ORAL
  Filled 2012-10-02 (×14): qty 1

## 2012-10-02 MED ORDER — FINASTERIDE 5 MG PO TABS
5.0000 mg | ORAL_TABLET | Freq: Every day | ORAL | Status: DC
  Administered 2012-10-02 – 2012-10-07 (×6): 5 mg via ORAL
  Filled 2012-10-02 (×6): qty 1

## 2012-10-02 MED ORDER — PANTOPRAZOLE SODIUM 40 MG PO TBEC
40.0000 mg | DELAYED_RELEASE_TABLET | Freq: Every day | ORAL | Status: DC
  Administered 2012-10-02 – 2012-10-07 (×6): 40 mg via ORAL
  Filled 2012-10-02 (×6): qty 1

## 2012-10-02 NOTE — Progress Notes (Signed)
CRITICAL VALUE ALERT  Critical value received:  Na+163  Date of notification:  10/02/12  Time of notification:  06:20  Critical value read back: yes  Nurse who received alert:  Sharen Heck  MD notified (1st page):  L. Bretta Bang, NP  Time of first page:  06:23  MD notified (2nd page):  Time of second page:  Responding MD:    Time MD responded:

## 2012-10-02 NOTE — Progress Notes (Signed)
[facility] Progress Note   DIGBY GROENEVELD NFA:[id number] DOB: 1929-03-07 DOA: 09/27/2012 PCP: Nadean Corwin, MD  Brief narrative: 79 year old male patient admitted in late March for seizure disorder exacerbation. Was changed to Keppra and discharged to nursing facility. Per the patient's family he was having increasing difficulty eating over the past week. Over the past 24 hours prior to admission he was unable to eat entirely and was having difficulty speaking. In addition to these symptoms the patient also noticed increased swelling in the submandibular area on the right side that has progressively worsened over 24 hours which prompted the family to bring the patient to the emergency department. In the ER patient was found to have acute renal failure with metabolic acidosis and creatinine greater than 8 which was much higher than his baseline of 1.85 back in March. CT of the neck soft tissue demonstrated possible submandibular infection. ENT physician was consulted. When initially evaluated by the admitting physician the patient was found to be mildly hypotensive and appeared quite dehydrated.   Assessment/Plan:  ARF (acute renal failure) on CKD (chronic kidney disease), stage III -baseline Scr 1.85 -current Scr has trending down with IVF, urine output good,  -suspect ATN due to preadmit ARB plus hypotension due to anti-HTN meds and diuretics -Appreciate Renal consult, IVF changed to d5W yetserday  Acute Submandibular sialoadenitis -appreciate ENT assistance -completed 3 days of IV steroids and empiric antibiotics IV  vanc and zosyn for 4 days,  -change to Po Augmentin 4/25 for 3 more days -cont supportive care -ENT ordered FNA  Per IR, will need to wait for anbx's treatment to complete first, needs to FU with Dr.Gore in 2 weeks and re-image then and determine need for biopsy  Dysphagia: Likely long standing, but not been formally accessed before -s/p swallow eval -Started  on D1 Diet with thick liquids -PO intake improving  Hypernatremia/Dehydration -changed IVF to d5w 4/26 -per Renal -bmet in am  Hypertension -stable -cont to hold tenormin and HCTZ -avoid ARB (losartan)  Seizure disorder -Cont Keppra, change to PO  Diabetes mellitus without complication -hold Metformin and would not resume after dc -cont SSI  DVT prophylaxis: SCDs  Code Status: DO NOT RESUSCITATE Family Communication: called and d/w daughter and family at bedside yesterday Disposition Plan: SNF early next week   Consultants: Nephrology ENT  Procedures: None  Antibiotics: Clindamycin 4/22 >>> 4/22 Zosyn 4/22 >>> Vancomycin 4/22 >>>4/24  HPI/Subjective: Much more alert and awake today, ate all of his breakfast when fed   Objective: Blood pressure 154/82, pulse 68, temperature 98 F (36.7 C), temperature source Oral, resp. rate 18, height 6' 3.2" (1.91 m), weight 87.4 kg (192 lb 10.9 oz), SpO2 100.00%.  Intake/Output Summary (Last 24 hours) at 10/02/12 1036 Last data filed at 10/02/12 0900  Gross per 24 hour  Intake 1356.25 ml  Output   1900 ml  Net -543.75 ml   Exam: General: No acute respiratory distress, more alert and interactive, confused about the place and time ENT: R neck swelling improving,  Moist oral mucous membranes , no JVD noted Lungs: Clear to auscultation bilaterally without wheezes or crackles, RA Cardiovascular: Regular rate and rhythm, systolic murmur, no gallop or rub normal S1 and S2 Abdomen: Nontender, nondistended, soft, bowel sounds positive, no rebound, no ascites, no appreciable mass Musculoskeletal: No significant cyanosis, clubbing of bilateral lower extremities Neurological: Alert and oriented x to self only, moves all extremities x 4 without focal neurological deficits  Data Reviewed: Basic Metabolic Panel:  Recent Labs Lab 09/28/12 0540 09/29/12 0445 09/30/12 0510 09/30/12 1555 10/01/12 0630 10/02/12 0510  NA 153*  158* 163* 161* 163* 163*  K 4.5 3.5 3.2* 2.9* 3.7 3.4*  CL 120* 122* 126* 124* 129* 127*  CO2 18* 26 28 27 27 28   GLUCOSE 131* 233* 175* 144* 120* 102*  BUN 189* 153* 126* 111* 93* 70*  CREATININE 6.94* 4.82* 3.76* 3.27* 2.88* 2.35*  CALCIUM 9.6 9.9 10.4 10.2 10.2 10.5  PHOS 6.6* 3.9 3.9  --  3.2 3.3   Liver Function Tests:  Recent Labs Lab 09/27/12 1640 09/28/12 0540 09/29/12 0445 09/30/12 0510 10/01/12 0630 10/02/12 0510  AST 16 11 10   --   --   --   ALT 10 7 6   --   --   --   ALKPHOS 52 45 43  --   --   --   BILITOT 0.3 0.3 0.3  --   --   --   PROT 8.2 6.9 6.8  --   --   --   ALBUMIN 3.4* 2.8* 2.8* 2.8* 2.7* 2.7*   CBC:  Recent Labs Lab 09/27/12 1640 09/28/12 0540 09/29/12 0445 09/30/12 0510 10/01/12 0630 10/02/12 0510  WBC 15.7* 12.7* 8.9 10.3 6.7 5.5  NEUTROABS 13.6* 10.9*  --   --   --   --   HGB 12.0* 10.0* 9.7* 10.2* 10.1* 10.6*  HCT 34.5* 28.2* 27.2* 29.0* 29.5* 31.2*  MCV 87.8 86.0 84.2 86.3 88.3 88.9  PLT 98* 84* 100* 101* 101* 98*   CBG:  Recent Labs Lab 10/01/12 1637 10/01/12 2027 10/02/12 0008 10/02/12 0409 10/02/12 0755  GLUCAP 193* 136* 81 95 108*    Recent Results (from the past 240 hour(s))  CULTURE, BLOOD (ROUTINE X 2)     Status: None   Collection Time    09/27/12  8:45 PM      Result Value Range Status   Specimen Description BLOOD LEFT ARM   Final   Special Requests BOTTLES DRAWN AEROBIC AND ANAEROBIC 10CC EACH   Final   Culture  Setup Time 09/28/2012 02:16   Final   Culture     Final   Value:        BLOOD CULTURE RECEIVED NO GROWTH TO DATE CULTURE WILL BE HELD FOR 5 DAYS BEFORE ISSUING A FINAL NEGATIVE REPORT   Report Status PENDING   Incomplete  CULTURE, BLOOD (ROUTINE X 2)     Status: None   Collection Time    09/27/12  8:55 PM      Result Value Range Status   Specimen Description BLOOD RIGHT HAND   Final   Special Requests BOTTLES DRAWN AEROBIC ONLY 5CC   Final   Culture  Setup Time 09/28/2012 02:16   Final   Culture      Final   Value:        BLOOD CULTURE RECEIVED NO GROWTH TO DATE CULTURE WILL BE HELD FOR 5 DAYS BEFORE ISSUING A FINAL NEGATIVE REPORT   Report Status PENDING   Incomplete  MRSA PCR SCREENING     Status: Abnormal   Collection Time    09/28/12 11:34 AM      Result Value Range Status   MRSA by PCR POSITIVE (*) NEGATIVE Final   Comment:            The GeneXpert MRSA Assay (FDA     approved for NASAL specimens     only), is one component of a  comprehensive MRSA colonization     surveillance program. It is not     intended to diagnose MRSA     infection nor to guide or     monitor treatment for     MRSA infections.     RESULT CALLED TO, READ BACK BY AND VERIFIED WITH:     MATTHEWS,H RN @ 1349 ON 09/28/12 BY LEONARD,A     Studies:  Recent x-ray studies have been reviewed in detail by the Attending Physician  Scheduled Meds: 10/02/2012, 10:36 AM   LOS: 5 days   Zannie Cove, MD [phone] [facility]

## 2012-10-02 NOTE — Progress Notes (Signed)
Subjective: Interval History: none.  Objective: Vital signs in last 24 hours: Temp:  [97.5 F (36.4 C)-98 F (36.7 C)] 98 F (36.7 C) (04/26 0959) Pulse Rate:  [66-70] 68 (04/26 0959) Resp:  [18] 18 (04/26 0959) BP: (142-160)/(72-82) 154/82 mmHg (04/26 0959) SpO2:  [98 %-100 %] 100 % (04/26 0959) Weight:  [87.4 kg (192 lb 10.9 oz)] 87.4 kg (192 lb 10.9 oz) (04/25 2029) Weight change: -1.5 kg (-3 lb 4.9 oz)  Intake/Output from previous day: 04/25 0701 - 04/26 0700 In: 1266.3 [P.O.:50; I.V.:1111.3; IV Piggyback:105] Out: 1450 [Urine:1450] Intake/Output this shift: Total I/O In: 120 [P.O.:120] Out: 450 [Urine:450]  General appearance: alert, pale and slowed mentation Resp: diminished breath sounds bilaterally Cardio: S1, S2 normal and systolic murmur: systolic ejection 2/6, decrescendo at 2nd left intercostal space GI: soft, non-tender; bowel sounds normal; no masses,  no organomegaly Extremities: extremities normal, atraumatic, no cyanosis or edema  Lab Results:  Recent Labs  10/01/12 0630 10/02/12 0510  WBC 6.7 5.5  HGB 10.1* 10.6*  HCT 29.5* 31.2*  PLT 101* 98*   BMET:  Recent Labs  10/01/12 0630 10/02/12 0510  NA 163* 163*  K 3.7 3.4*  CL 129* 127*  CO2 27 28  GLUCOSE 120* 102*  BUN 93* 70*  CREATININE 2.88* 2.35*  CALCIUM 10.2 10.5   No results found for this basename: PTH,  in the last 72 hours Iron Studies: No results found for this basename: IRON, TIBC, TRANSFERRIN, FERRITIN,  in the last 72 hours  Studies/Results: Dg Swallowing Func-speech Pathology  09/30/2012  Kurt [name], CCC-SLP     09/30/2012 12:50 PM Objective Swallowing Evaluation: Modified Barium Swallowing Study   Patient Details  Name: Kurt [name] MRN: [id number] Date of Birth: 12/19/1928  Today's Date: 09/30/2012 Time: 1110-1135 SLP Time Calculation (min): 25 min  Past Medical History:  Past Medical History  Diagnosis Date  . Hypertension   . Hyperlipidemia   . Seizure disorder   .  CKD (chronic kidney disease), stage III   . Altered mental state 08/31/2012    "came into hospital a little disoriented; it's gotten worse;  this is not normal" (08/31/2012)  . Seizures   . Type II diabetes mellitus   . Chronic lower back pain    Past Surgical History:  Past Surgical History  Procedure Laterality Date  . Brain tumor excision    . 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 male  with acute renal failure and  right neck swelling. Pt c/o difficulty swallowing for past week.  CT neck without contrast showed hypotrophic left 9mm  submandibular gland and right homogeneous 2x3 cm submandibular  gland without obvious mass or abscess and surrounding soft tissue  stranding likely submandibular sialoadenitis. Pt started on  antibiotics for treatment of mass.  Bedside swallow assessment  recommended MBS.     Assessment / Plan / Recommendation Clinical Impression  Dysphagia Diagnosis: Severe oral phase dysphagia;Moderate  pharyngeal phase dysphagia;Severe pharyngeal phase dysphagia Clinical impression: Pt. demonstrated severe oral dysphagia with  poor ability to form bolus, maintain cohesion leading to residue  throughout oral cavity and transit delays.  Pharyngeal phase  includes moderate-severe impairments both sensory and motor  tracts affected.  Delayed swallow initiation, reduced pharyngeal  contraction. decreased laryngeal elevation and epiglottic closure  led to moderate-severe vallecular and moderate pyriform sinus  residue throughout study.  Frank aspiration of thin before and  during swallow (basically silent with  one minimal throat clear).   Pt. does not sense majority of residue and was unable to  effectively swallow additional times with therapeutic  intervention via verbal/visual cues (several times a reflexive  2nd swallow performed and cleared mild amount).  Unfortunately at  this time he aspiration does not appear preventable with any  diet/liquid modifications.  Pt.  confused with dementia and unable  to perform compensatory techniques.  Dys 1 and nectar thick may  have the least potential to penetrate/aspirate, however not  guaranteed.  Discussed with Dr. Jomarie Longs and explained clinical  findings and results of swallow assessments with precautions to  pt.'s family (mom has dementia, 2 daughters present).  ST will  continue to follow for education.       Treatment Recommendation  Therapy as outlined in treatment plan below    Diet Recommendation Dysphagia 1 (Puree);Nectar-thick liquid   Liquid Administration via: Cup;No straw Medication Administration: Crushed with puree Supervision: Full supervision/cueing for compensatory strategies Compensations: Slow rate;Small sips/bites;Check for  pocketing;Multiple dry swallows after each bite/sip Postural Changes and/or Swallow Maneuvers: Seated upright 90  degrees    Other  Recommendations Oral Care Recommendations: Oral care QID   Follow Up Recommendations   (to be determined)    Frequency and Duration min 2x/week  2 weeks   Pertinent Vitals/Pain     SLP Swallow Goals Patient will utilize recommended strategies during swallow to  increase swallowing safety with: Maximal cueing Goal #3: Pt will participate in therapeutic po trials with  increased secretion management  (coughing, throat clear) with mod  verbal cues  Swallow Study Goal #3 - Progress: Met Goal #4: Family will verbalize understanding of swallow  precautions with min cues.      Reason for Referral Objectively evaluate swallowing function   Oral Phase Oral Preparation/Oral Phase Oral Phase: Impaired Oral - Honey Oral - Honey Teaspoon: Weak lingual manipulation;Right pocketing  in lateral sulci;Left pocketing in lateral sulci;Delayed oral  transit;Reduced posterior propulsion;Lingual/palatal residue Oral - Honey Cup: Weak lingual manipulation;Right pocketing in  lateral sulci;Left pocketing in lateral sulci;Delayed oral  transit;Reduced posterior propulsion;Lingual/palatal  residue  (sublingual residue) Oral - Nectar Oral - Nectar Teaspoon: Weak lingual manipulation;Right pocketing  in lateral sulci;Left pocketing in lateral sulci;Delayed oral  transit;Reduced posterior propulsion;Lingual/palatal residue Oral - Nectar Cup: Weak lingual manipulation;Right pocketing in  lateral sulci;Left pocketing in lateral sulci;Delayed oral  transit;Reduced posterior propulsion;Lingual/palatal residue Oral - Thin Oral - Thin Teaspoon: Weak lingual manipulation;Right pocketing  in lateral sulci;Left pocketing in lateral sulci;Delayed oral  transit;Reduced posterior propulsion;Lingual/palatal residue Oral - Thin Cup: Weak lingual manipulation;Right pocketing in  lateral sulci;Left pocketing in lateral sulci;Delayed oral  transit;Reduced posterior propulsion;Lingual/palatal residue Oral - Solids Oral - Puree: Weak lingual manipulation;Right pocketing in  lateral sulci;Left pocketing in lateral sulci;Delayed oral  transit;Reduced posterior propulsion;Lingual/palatal residue   Pharyngeal Phase Pharyngeal Phase Pharyngeal Phase: Impaired Pharyngeal - Honey Pharyngeal - Honey Teaspoon: Delayed swallow initiation;Premature  spillage to valleculae;Premature spillage to pyriform  sinuses;Pharyngeal residue - valleculae;Pharyngeal residue -  pyriform sinuses;Reduced tongue base retraction;Reduced  pharyngeal peristalsis;Reduced laryngeal elevation Pharyngeal - Honey Cup: Delayed swallow initiation;Premature  spillage to valleculae;Premature spillage to pyriform  sinuses;Pharyngeal residue - valleculae;Pharyngeal residue -  pyriform sinuses;Reduced tongue base retraction;Reduced  pharyngeal peristalsis;Reduced laryngeal elevation Pharyngeal - Nectar Pharyngeal - Nectar Teaspoon: Delayed swallow  initiation;Premature spillage to valleculae;Premature spillage to  pyriform sinuses;Pharyngeal residue - valleculae;Pharyngeal  residue - pyriform sinuses;Reduced tongue base retraction;Reduced  pharyngeal  peristalsis;Reduced laryngeal elevation Pharyngeal - Nectar Cup:  Delayed swallow initiation;Premature  spillage to valleculae;Premature spillage to pyriform  sinuses;Pharyngeal residue - valleculae;Pharyngeal residue -  pyriform sinuses;Reduced tongue base retraction;Reduced  pharyngeal peristalsis;Reduced laryngeal elevation;Reduced  anterior laryngeal mobility;Reduced airway/laryngeal  closure;Penetration/Aspiration during swallow Penetration/Aspiration details (nectar cup): Material enters  airway, remains ABOVE vocal cords and not ejected out Pharyngeal - Thin Pharyngeal - Thin Teaspoon: Delayed swallow initiation;Premature  spillage to valleculae;Premature spillage to pyriform  sinuses;Pharyngeal residue - valleculae;Pharyngeal residue -  pyriform sinuses;Reduced tongue base retraction;Reduced  pharyngeal peristalsis;Reduced laryngeal  elevation;Penetration/Aspiration during swallow;Reduced anterior  laryngeal mobility;Reduced airway/laryngeal closure Penetration/Aspiration details (thin teaspoon): Material enters  airway, remains ABOVE vocal cords and not ejected out Pharyngeal - Thin Cup: Delayed swallow initiation;Premature  spillage to valleculae;Premature spillage to pyriform  sinuses;Pharyngeal residue - valleculae;Pharyngeal residue -  pyriform sinuses;Reduced tongue base retraction;Reduced  pharyngeal peristalsis;Reduced laryngeal  elevation;Penetration/Aspiration during swallow;Reduced anterior  laryngeal mobility;Reduced airway/laryngeal  closure;Penetration/Aspiration before swallow;Significant  aspiration (Amount) Penetration/Aspiration details (thin cup): Material enters  airway, passes BELOW cords without attempt by patient to eject  out (silent aspiration) (slight throat clear) Pharyngeal - Solids Pharyngeal - Puree: Delayed swallow initiation;Pharyngeal residue  - valleculae;Pharyngeal residue - pyriform sinuses;Reduced tongue  base retraction;Reduced pharyngeal peristalsis;Reduced laryngeal   elevation;Premature spillage to valleculae  Cervical Esophageal Phase    GO    Cervical Esophageal Phase Cervical Esophageal Phase: [facility]         Darrow Bussing.Ed CCC-SLP Pager [phone]  09/30/2012      I have reviewed the patient's current medications.  Assessment/Plan: 1 AKI steady improv in GFR.  Will not go to baseline but is acceptable.  Major issue is adequate intake. SNa ^ withpost ATN diuresis. O>I.  Cont D5 2 ^ SNa as above 3 Sz disorder 4 Dementia 5 DM controlled 6 HTN avoid ACEI/ARB P D5 , po, follow Cr    LOS: 5 days   Mackson Botz L 10/02/2012,11:28 AM

## 2012-10-03 LAB — CBC
HCT: 30.2 % — ABNORMAL LOW (ref 39.0–52.0)
MCH: 30.2 pg (ref 26.0–34.0)
MCV: 89.3 fL (ref 78.0–100.0)
RDW: 14.5 % (ref 11.5–15.5)
WBC: 5.5 10*3/uL (ref 4.0–10.5)

## 2012-10-03 LAB — RENAL FUNCTION PANEL
Albumin: 2.6 g/dL — ABNORMAL LOW (ref 3.5–5.2)
BUN: 53 mg/dL — ABNORMAL HIGH (ref 6–23)
Calcium: 10.1 mg/dL (ref 8.4–10.5)
Chloride: 127 mEq/L — ABNORMAL HIGH (ref 96–112)
Creatinine, Ser: 2.18 mg/dL — ABNORMAL HIGH (ref 0.50–1.35)

## 2012-10-03 LAB — GLUCOSE, CAPILLARY
Glucose-Capillary: 115 mg/dL — ABNORMAL HIGH (ref 70–99)
Glucose-Capillary: 120 mg/dL — ABNORMAL HIGH (ref 70–99)
Glucose-Capillary: 108 mg/dL — ABNORMAL HIGH (ref 70–99)

## 2012-10-03 NOTE — Progress Notes (Signed)
Subjective: Interval History: none.  Objective: Vital signs in last 24 hours: Temp:  [97 F (36.1 C)-98.6 F (37 C)] 97 F (36.1 C) (04/27 1000) Pulse Rate:  [62-70] 68 (04/27 1000) Resp:  [16-18] 18 (04/27 1000) BP: (126-146)/(60-76) 146/60 mmHg (04/27 1000) SpO2:  [100 %] 100 % (04/27 1000) Weight:  [88.225 kg (194 lb 8 oz)] 88.225 kg (194 lb 8 oz) (04/26 2124) Weight change: 0.825 kg (1 lb 13.1 oz)  Intake/Output from previous day: 04/26 0701 - 04/27 0700 In: 1197.5 [P.O.:360; I.V.:837.5] Out: 450 [Urine:450] Intake/Output this shift: Total I/O In: 60 [P.O.:60] Out: -   General appearance: cooperative and slowed mentation Resp: diminished breath sounds bilaterally Cardio: S1, S2 normal and systolic murmur: holosystolic 2/6, blowing at apex GI: pos bs, soft Extremities: extremities normal, atraumatic, no cyanosis or edema  Lab Results:  Recent Labs  10/02/12 0510 10/03/12 0516  WBC 5.5 5.5  HGB 10.6* 10.2*  HCT 31.2* 30.2*  PLT 98* 95*   BMET:  Recent Labs  10/02/12 0510 10/03/12 0516  NA 163* 160*  K 3.4* 3.8  CL 127* 127*  CO2 28 26  GLUCOSE 102* 132*  BUN 70* 53*  CREATININE 2.35* 2.18*  CALCIUM 10.5 10.1   No results found for this basename: PTH,  in the last 72 hours Iron Studies: No results found for this basename: IRON, TIBC, TRANSFERRIN, FERRITIN,  in the last 72 hours  Studies/Results: No results found.  I have reviewed the patient's current medications.  Assessment/Plan: 1 AKI slowly resolving.. Better intake and hypotonic ivf so SNa improving 2 Poor intake primary issue 3 Sz disorder 4 DM controlled 5 HTN not an issue now P no ACEI or ARBS, cont ivf hypotonic, push po.  Will s/o at this time, and see again at your request.    LOS: 6 days   Briget Shaheed L 10/03/2012,11:09 AM

## 2012-10-03 NOTE — Progress Notes (Signed)
[facility] Progress Note   Kurt [name] ZOX:[id number] DOB: 02-May-1929 DOA: 09/27/2012 PCP: Nadean Corwin, MD  Brief narrative: 79 year old male patient admitted in late March for seizure disorder exacerbation. Was changed to Keppra and discharged to nursing facility. Per the patient's family he was having increasing difficulty eating over the past week. Over the past 24 hours prior to admission he was unable to eat entirely and was having difficulty speaking. In addition to these symptoms the patient also noticed increased swelling in the submandibular area on the right side that has progressively worsened over 24 hours which prompted the family to bring the patient to the emergency department. In the ER patient was found to have acute renal failure with metabolic acidosis and creatinine greater than 8 which was much higher than his baseline of 1.85 back in March. CT of the neck soft tissue demonstrated possible submandibular infection. ENT physician was consulted. When initially evaluated by the admitting physician the patient was found to be mildly hypotensive and appeared quite dehydrated.   Assessment/Plan:  ARF (acute renal failure) on CKD (chronic kidney disease), stage III -baseline Scr 1.85 -current Scr has trending down with IVF, urine output good,  -suspect ATN due to preadmit ARB plus hypotension due to anti-HTN meds and diuretics -Appreciate Renal consult, continue D5W -poor Po intake will continue to be a long term issue  Acute Submandibular sialoadenitis -appreciate ENT assistance -completed 3 days of IV steroids and empiric antibiotics IV  vanc and zosyn for 4 days,  -change to Po Augmentin 4/25 for 3 more days, DC after todays dose -cont supportive care -ENT ordered FNA  Per IR, will need to wait for anbx's treatment to complete first, needs to FU with Dr.Gore in 2 weeks and re-image then and determine need for biopsy  Dysphagia: Likely long standing, but  not been formally accessed before -s/p swallow eval -Started on D1 Diet with thick liquids -PO intake improving  Hypernatremia/Dehydration -changed IVF to d5w 4/26 -per Renal -bmet in am  Hypertension -stable -cont to hold tenormin and HCTZ -avoid ARB (losartan)  Seizure disorder -Cont Keppra, change to PO  Diabetes mellitus without complication -hold Metformin and would not resume after dc -cont SSI  DVT prophylaxis: SCDs  Code Status: DO NOT RESUSCITATE Family Communication: called and d/w daughter and family at bedside yesterday Disposition Plan: SNF early next week   Consultants: Nephrology ENT  Procedures: None  Antibiotics: Clindamycin 4/22 >>> 4/22 Zosyn 4/22 >>> Vancomycin 4/22 >>>4/24  HPI/Subjective: Much more alert and awake today, ate all of his breakfast when fed   Objective: Blood pressure 132/66, pulse 72, temperature 98 F (36.7 C), temperature source Oral, resp. rate 18, height 6' 3.2" (1.91 m), weight 88.225 kg (194 lb 8 oz), SpO2 100.00%.  Intake/Output Summary (Last 24 hours) at 10/03/12 1532 Last data filed at 10/03/12 1300  Gross per 24 hour  Intake 1257.5 ml  Output      0 ml  Net 1257.5 ml   Exam: General: No acute respiratory distress, more alert and interactive, confused about the place and time ENT: R neck swelling improving,  Moist oral mucous membranes , no JVD noted Lungs: Clear to auscultation bilaterally without wheezes or crackles, RA Cardiovascular: Regular rate and rhythm, systolic murmur, no gallop or rub normal S1 and S2 Abdomen: Nontender, nondistended, soft, bowel sounds positive, no rebound, no ascites, no appreciable mass Musculoskeletal: No significant cyanosis, clubbing of bilateral lower extremities Neurological: Alert and oriented x to self only,  moves all extremities x 4 without focal neurological deficits  Data Reviewed: Basic Metabolic Panel:  Recent Labs Lab 09/29/12 0445 09/30/12 0510  09/30/12 1555 10/01/12 0630 10/02/12 0510 10/03/12 0516  NA 158* 163* 161* 163* 163* 160*  K 3.5 3.2* 2.9* 3.7 3.4* 3.8  CL 122* 126* 124* 129* 127* 127*  CO2 26 28 27 27 28 26   GLUCOSE 233* 175* 144* 120* 102* 132*  BUN 153* 126* 111* 93* 70* 53*  CREATININE 4.82* 3.76* 3.27* 2.88* 2.35* 2.18*  CALCIUM 9.9 10.4 10.2 10.2 10.5 10.1  PHOS 3.9 3.9  --  3.2 3.3 2.7   Liver Function Tests:  Recent Labs Lab 09/27/12 1640 09/28/12 0540 09/29/12 0445 09/30/12 0510 10/01/12 0630 10/02/12 0510 10/03/12 0516  AST 16 11 10   --   --   --   --   ALT 10 7 6   --   --   --   --   ALKPHOS 52 45 43  --   --   --   --   BILITOT 0.3 0.3 0.3  --   --   --   --   PROT 8.2 6.9 6.8  --   --   --   --   ALBUMIN 3.4* 2.8* 2.8* 2.8* 2.7* 2.7* 2.6*   CBC:  Recent Labs Lab 09/27/12 1640 09/28/12 0540 09/29/12 0445 09/30/12 0510 10/01/12 0630 10/02/12 0510 10/03/12 0516  WBC 15.7* 12.7* 8.9 10.3 6.7 5.5 5.5  NEUTROABS 13.6* 10.9*  --   --   --   --   --   HGB 12.0* 10.0* 9.7* 10.2* 10.1* 10.6* 10.2*  HCT 34.5* 28.2* 27.2* 29.0* 29.5* 31.2* 30.2*  MCV 87.8 86.0 84.2 86.3 88.3 88.9 89.3  PLT 98* 84* 100* 101* 101* 98* 95*   CBG:  Recent Labs Lab 10/02/12 1124 10/02/12 1648 10/02/12 2139 10/03/12 0729 10/03/12 1146  GLUCAP 183* 120* 193* 115* 153*    Recent Results (from the past 240 hour(s))  CULTURE, BLOOD (ROUTINE X 2)     Status: None   Collection Time    09/27/12  8:45 PM      Result Value Range Status   Specimen Description BLOOD LEFT ARM   Final   Special Requests BOTTLES DRAWN AEROBIC AND ANAEROBIC 10CC EACH   Final   Culture  Setup Time 09/28/2012 02:16   Final   Culture     Final   Value:        BLOOD CULTURE RECEIVED NO GROWTH TO DATE CULTURE WILL BE HELD FOR 5 DAYS BEFORE ISSUING A FINAL NEGATIVE REPORT   Report Status PENDING   Incomplete  CULTURE, BLOOD (ROUTINE X 2)     Status: None   Collection Time    09/27/12  8:55 PM      Result Value Range Status    Specimen Description BLOOD RIGHT HAND   Final   Special Requests BOTTLES DRAWN AEROBIC ONLY 5CC   Final   Culture  Setup Time 09/28/2012 02:16   Final   Culture     Final   Value:        BLOOD CULTURE RECEIVED NO GROWTH TO DATE CULTURE WILL BE HELD FOR 5 DAYS BEFORE ISSUING A FINAL NEGATIVE REPORT   Report Status PENDING   Incomplete  MRSA PCR SCREENING     Status: Abnormal   Collection Time    09/28/12 11:34 AM      Result Value Range Status   MRSA by  PCR POSITIVE (*) NEGATIVE Final   Comment:            The GeneXpert MRSA Assay (FDA     approved for NASAL specimens     only), is one component of a     comprehensive MRSA colonization     surveillance program. It is not     intended to diagnose MRSA     infection nor to guide or     monitor treatment for     MRSA infections.     RESULT CALLED TO, READ BACK BY AND VERIFIED WITH:     MATTHEWS,H RN @ 1349 ON 09/28/12 BY LEONARD,A     Studies:  Recent x-ray studies have been reviewed in detail by the Attending Physician  Scheduled Meds: 10/03/2012, 3:32 PM   LOS: 6 days   Zannie Cove, MD [phone] [facility]

## 2012-10-04 LAB — CBC
Hemoglobin: 10.7 g/dL — ABNORMAL LOW (ref 13.0–17.0)
MCV: 89.7 fL (ref 78.0–100.0)
Platelets: 91 10*3/uL — ABNORMAL LOW (ref 150–400)
RBC: 3.59 MIL/uL — ABNORMAL LOW (ref 4.22–5.81)
WBC: 7.6 10*3/uL (ref 4.0–10.5)

## 2012-10-04 LAB — CULTURE, BLOOD (ROUTINE X 2)

## 2012-10-04 LAB — GLUCOSE, CAPILLARY
Glucose-Capillary: 146 mg/dL — ABNORMAL HIGH (ref 70–99)
Glucose-Capillary: 79 mg/dL (ref 70–99)

## 2012-10-04 LAB — BASIC METABOLIC PANEL
CO2: 27 mEq/L (ref 19–32)
Chloride: 122 mEq/L — ABNORMAL HIGH (ref 96–112)
Sodium: 155 mEq/L — ABNORMAL HIGH (ref 135–145)

## 2012-10-04 MED ORDER — INSULIN DETEMIR 100 UNIT/ML ~~LOC~~ SOLN
6.0000 [IU] | Freq: Two times a day (BID) | SUBCUTANEOUS | Status: DC
  Administered 2012-10-05 (×2): 6 [IU] via SUBCUTANEOUS
  Filled 2012-10-04 (×3): qty 0.06

## 2012-10-04 NOTE — Progress Notes (Signed)
[facility] Progress Note   Kurt [name] YNW:[id number] DOB: 11-06-1928 DOA: 09/27/2012 PCP: Nadean Corwin, MD  Brief narrative: 79 year old male patient admitted in late March for seizure disorder exacerbation. Was changed to Keppra and discharged to nursing facility. Per the patient's family he was having increasing difficulty eating over the past week. Over the past 24 hours prior to admission he was unable to eat entirely and was having difficulty speaking. In addition to these symptoms the patient also noticed increased swelling in the submandibular area on the right side that has progressively worsened over 24 hours which prompted the family to bring the patient to the emergency department. In the ER patient was found to have acute renal failure with metabolic acidosis and creatinine greater than 8 which was much higher than his baseline of 1.85 back in March. CT of the neck soft tissue demonstrated possible submandibular infection. ENT physician was consulted. When initially evaluated by the admitting physician the patient was found to be mildly hypotensive and appeared quite dehydrated.   Assessment/Plan:  ARF (acute renal failure) on CKD (chronic kidney disease), stage III -baseline Scr 1.85 -current Scr has trending down with IVF, urine output good,  -suspect ATN due to preadmit ARB plus hypotension due to anti-HTN meds and diuretics -Appreciate Renal consult, continue D5W at 75cc/hr -poor Po intake will continue to be a long term issue, this has been d/w daughter and the need to look at hospice down the road  Acute Submandibular sialoadenitis -appreciate ENT assistance -completed 3 days of IV steroids and empiric antibiotics IV  vanc and zosyn for 4 days,  -change to Po Augmentin 4/25 for 3 more days, DC after todays dose -cont supportive care -ENT ordered FNA  Per IR, will need to wait for anbx's treatment to complete first, needs to FU with Dr.Gore in 2 weeks and  re-image then and determine need for biopsy  Dysphagia: Likely long standing, but not been formally accessed before -s/p swallow eval -Started on D1 Diet with thick liquids -PO intake improving  Hypernatremia/Dehydration -changed IVF to d5w 4/26 -appreciate Renal input -bmet in am  Hypertension -stable -cont to hold tenormin and HCTZ -avoid ARB (losartan)  Seizure disorder -Cont Keppra, change to PO  Diabetes mellitus without complication -hold Metformin and would not resume after dc -cont SSI  DVT prophylaxis: SCDs  Code Status: DO NOT RESUSCITATE Family Communication: called and d/w daughter, no family at bedside as usual Disposition Plan: SNF in 1-2 days   Consultants: Nephrology ENT  Procedures: None  Antibiotics: Clindamycin 4/22 >>> 4/22 Zosyn 4/22 >>> Vancomycin 4/22 >>>4/24  HPI/Subjective: Much more alert and awake today, ate all of his breakfast when fed   Objective: Blood pressure 140/72, pulse 71, temperature 99.1 F (37.3 C), temperature source Oral, resp. rate 18, height 6' 3.2" (1.91 m), weight 88.7 kg (195 lb 8.8 oz), SpO2 95.00%.  Intake/Output Summary (Last 24 hours) at 10/04/12 1639 Last data filed at 10/04/12 1333  Gross per 24 hour  Intake 1011.25 ml  Output      0 ml  Net 1011.25 ml   Exam: General: No acute respiratory distress, more alert and interactive, confused about the place and time ENT: R neck swelling improving,  Moist oral mucous membranes , no JVD noted Lungs: Clear to auscultation bilaterally without wheezes or crackles, RA Cardiovascular: Regular rate and rhythm, systolic murmur, no gallop or rub normal S1 and S2 Abdomen: Nontender, nondistended, soft, bowel sounds positive, no rebound, no ascites, no  appreciable mass Musculoskeletal: No significant cyanosis, clubbing of bilateral lower extremities Neurological: Alert and oriented x to self only, moves all extremities x 4 without focal neurological deficits  Data  Reviewed: Basic Metabolic Panel:  Recent Labs Lab 09/29/12 0445 09/30/12 0510 09/30/12 1555 10/01/12 0630 10/02/12 0510 10/03/12 0516 10/04/12 0810  NA 158* 163* 161* 163* 163* 160* 155*  K 3.5 3.2* 2.9* 3.7 3.4* 3.8 3.7  CL 122* 126* 124* 129* 127* 127* 122*  CO2 26 28 27 27 28 26 27   GLUCOSE 233* 175* 144* 120* 102* 132* 104*  BUN 153* 126* 111* 93* 70* 53* 39*  CREATININE 4.82* 3.76* 3.27* 2.88* 2.35* 2.18* 2.02*  CALCIUM 9.9 10.4 10.2 10.2 10.5 10.1 10.4  PHOS 3.9 3.9  --  3.2 3.3 2.7  --    Liver Function Tests:  Recent Labs Lab 09/27/12 1640 09/28/12 0540 09/29/12 0445 09/30/12 0510 10/01/12 0630 10/02/12 0510 10/03/12 0516  AST 16 11 10   --   --   --   --   ALT 10 7 6   --   --   --   --   ALKPHOS 52 45 43  --   --   --   --   BILITOT 0.3 0.3 0.3  --   --   --   --   PROT 8.2 6.9 6.8  --   --   --   --   ALBUMIN 3.4* 2.8* 2.8* 2.8* 2.7* 2.7* 2.6*   CBC:  Recent Labs Lab 09/27/12 1640 09/28/12 0540  09/30/12 0510 10/01/12 0630 10/02/12 0510 10/03/12 0516 10/04/12 0810  WBC 15.7* 12.7*  < > 10.3 6.7 5.5 5.5 7.6  NEUTROABS 13.6* 10.9*  --   --   --   --   --   --   HGB 12.0* 10.0*  < > 10.2* 10.1* 10.6* 10.2* 10.7*  HCT 34.5* 28.2*  < > 29.0* 29.5* 31.2* 30.2* 32.2*  MCV 87.8 86.0  < > 86.3 88.3 88.9 89.3 89.7  PLT 98* 84*  < > 101* 101* 98* 95* 91*  < > = values in this interval not displayed. CBG:  Recent Labs Lab 10/03/12 1146 10/03/12 1646 10/03/12 2154 10/04/12 0729 10/04/12 1152  GLUCAP 153* 120* 108* 84 146*    Recent Results (from the past 240 hour(s))  CULTURE, BLOOD (ROUTINE X 2)     Status: None   Collection Time    09/27/12  8:45 PM      Result Value Range Status   Specimen Description BLOOD LEFT ARM   Final   Special Requests BOTTLES DRAWN AEROBIC AND ANAEROBIC 10CC EACH   Final   Culture  Setup Time 09/28/2012 02:16   Final   Culture NO GROWTH 5 DAYS   Final   Report Status 10/04/2012 FINAL   Final  CULTURE, BLOOD  (ROUTINE X 2)     Status: None   Collection Time    09/27/12  8:55 PM      Result Value Range Status   Specimen Description BLOOD RIGHT HAND   Final   Special Requests BOTTLES DRAWN AEROBIC ONLY 5CC   Final   Culture  Setup Time 09/28/2012 02:16   Final   Culture NO GROWTH 5 DAYS   Final   Report Status 10/04/2012 FINAL   Final  MRSA PCR SCREENING     Status: Abnormal   Collection Time    09/28/12 11:34 AM      Result Value Range  Status   MRSA by PCR POSITIVE (*) NEGATIVE Final   Comment:            The GeneXpert MRSA Assay (FDA     approved for NASAL specimens     only), is one component of a     comprehensive MRSA colonization     surveillance program. It is not     intended to diagnose MRSA     infection nor to guide or     monitor treatment for     MRSA infections.     RESULT CALLED TO, READ BACK BY AND VERIFIED WITH:     MATTHEWS,H RN @ 1349 ON 09/28/12 BY LEONARD,A     Studies:  Recent x-ray studies have been reviewed in detail by the Attending Physician  Scheduled Meds: 10/04/2012, 4:39 PM   LOS: 7 days   Zannie Cove, MD [phone] [facility]

## 2012-10-04 NOTE — Progress Notes (Signed)
Physical Therapy Treatment Patient Details Name: Kurt [name] MRN: [id number] DOB: 05/31/1929 Today's Date: 10/04/2012 Time: 4098-1191 PT Time Calculation (min): 36 min  PT Assessment / Plan / Recommendation Comments on Treatment Session  Making progress with mobility, especially amb distance and activity tolerance    Follow Up Recommendations  SNF;Supervision/Assistance - 24 hour     Does the patient have the potential to tolerate intense rehabilitation     Barriers to Discharge        Equipment Recommendations  None recommended by PT    Recommendations for Other Services    Frequency Min 3X/week   Plan Discharge plan remains appropriate;Frequency remains appropriate    Precautions / Restrictions Precautions Precautions: Fall Precaution Comments: history of falling.     Pertinent Vitals/Pain no apparent distress     Mobility  Bed Mobility Bed Mobility: Supine to Sit;Sitting - Scoot to Edge of Bed Supine to Sit: HOB flat;With rails;4: Min assist Sitting - Scoot to Edge of Bed: 4: Min assist;With rail Details for Bed Mobility Assistance: Cues for initiation and technique; Slow moving, but able to complete task with less assist Transfers Transfers: Sit to Stand;Stand to Sit Sit to Stand: 3: Mod assist;From bed;With upper extremity assist;From elevated surface;From chair/3-in-1 Sit to Stand: Patient Percentage: 70% Stand to Sit: 3: Mod assist;To chair/3-in-1;To bed;With upper extremity assist Stand to Sit: Patient Percentage: 70% Details for Transfer Assistance: VCs for technique. Assist to initiate standing and control descent for sitting  Ambulation/Gait Ambulation/Gait Assistance: 1: +2 Total assist Ambulation/Gait: Patient Percentage: 60% Ambulation Distance (Feet): 45 Feet Assistive device: Rolling walker (and 2nd person to push chair behind for safety) Ambulation/Gait Assistance Details: Continued cues for upright posture Gait Pattern: Step-to  pattern;Decreased stride length;Decreased hip/knee flexion - right;Decreased hip/knee flexion - left;Trunk flexed Gait velocity: quite slow    Exercises     PT Diagnosis:    PT Problem List:   PT Treatment Interventions:     PT Goals Acute Rehab PT Goals Time For Goal Achievement: 10/14/12 Potential to Achieve Goals: Fair Pt will go Supine/Side to Sit: with modified independence PT Goal: Supine/Side to Sit - Progress: Progressing toward goal Pt will go Sit to Stand: with supervision PT Goal: Sit to Stand - Progress: Progressing toward goal Pt will go Stand to Sit: with supervision PT Goal: Stand to Sit - Progress: Progressing toward goal Pt will Ambulate: 16 - 50 feet;with min assist;with rolling walker PT Goal: Ambulate - Progress: Progressing toward goal  Visit Information  Last PT Received On: 10/04/12 Assistance Needed: +2    Subjective Data  Subjective: Declined OOB at first, but with after some conversation, agreeable to walk Patient Stated Goal: not stated   Cognition  Cognition Arousal/Alertness: Awake/alert Behavior During Therapy: WFL for tasks assessed/performed Overall Cognitive Status: Impaired/Different from baseline Area of Impairment: Orientation Orientation Level: Disoriented to;Place;Time;Situation Following Commands: Follows one step commands consistently;Follows one step commands with increased time    Balance     End of Session PT - End of Session Equipment Utilized During Treatment: Gait belt Activity Tolerance: Patient tolerated treatment well Patient left: in chair;with chair alarm set;with call bell/phone within reach Nurse Communication: Mobility status   GP     Van Clines [facility] [place], [place] [phone]  10/04/2012, 12:50 PM

## 2012-10-04 NOTE — Clinical Social Work Note (Addendum)
11:45am- CSW confirmed receipt of updated clinicals from [place] [facility] [place].  CSW sent updated clinicals to [facility].  Vickii Penna, LCSWA [phone]  Clinical Social Work

## 2012-10-04 NOTE — Clinical Social Work Note (Signed)
CSW contacted daughter, Gaston Islam to provide update of disposition of pt.  Family and pt requesting [employer].  CSW provided daughter with the process of d/c.  Once medically stable, CSW will obtain an updated [facility] Authorization prior to d/c.  Per progression meeting, pt may be ready to d/c as early as Tuesday/Wednesday depending on clinicals.  CSW will continue to provide update to family.  Vickii Penna, LCSWA [phone]  Clinical Social Work

## 2012-10-04 NOTE — Clinical Social Work Note (Signed)
CSW contacted [employer] [phone] and spoke with Crystal re: pt projected d/c of Tues/Wed depending on clinicals.  [facility] is still projecting pt admission.  Crystal requested facesheet for pt.  CSW will send this electronically to [facility].    Vickii Penna, LCSWA [phone]  Clinical Social Work

## 2012-10-05 DIAGNOSIS — E870 Hyperosmolality and hypernatremia: Secondary | ICD-10-CM

## 2012-10-05 LAB — BASIC METABOLIC PANEL
Calcium: 10.1 mg/dL (ref 8.4–10.5)
GFR calc Af Amer: 32 mL/min — ABNORMAL LOW (ref >90)
GFR calc non Af Amer: 28 mL/min — ABNORMAL LOW (ref >90)
Sodium: 150 mEq/L — ABNORMAL HIGH (ref 135–145)

## 2012-10-05 LAB — GLUCOSE, CAPILLARY
Glucose-Capillary: 141 mg/dL — ABNORMAL HIGH (ref 70–99)
Glucose-Capillary: 196 mg/dL — ABNORMAL HIGH (ref 70–99)

## 2012-10-05 MED ORDER — ATENOLOL 50 MG PO TABS
50.0000 mg | ORAL_TABLET | Freq: Every day | ORAL | Status: DC
  Administered 2012-10-05 – 2012-10-07 (×3): 50 mg via ORAL
  Filled 2012-10-05 (×3): qty 1

## 2012-10-05 MED ORDER — PANTOPRAZOLE SODIUM 40 MG PO TBEC: 40.0000 mg | DELAYED_RELEASE_TABLET | Freq: Every day | ORAL | Status: DC

## 2012-10-05 MED ORDER — ATENOLOL 100 MG PO TABS: 50.0000 mg | ORAL_TABLET | Freq: Every day | ORAL | Status: DC

## 2012-10-05 MED ORDER — INSULIN DETEMIR 100 UNIT/ML ~~LOC~~ SOLN: 4.0000 [IU] | Freq: Two times a day (BID) | SUBCUTANEOUS | Status: DC

## 2012-10-05 NOTE — Progress Notes (Signed)
Speech Language Pathology Dysphagia Treatment Patient Details Name: Kurt [name] MRN: [id number] DOB: 03-03-1929 Today's Date: 10/05/2012 Time: 4098-1191 SLP Time Calculation (min): 23 min  Assessment / Plan / Recommendation Clinical Impression  Pt seen for skilled dysphagia tx to determine tolerance of po diet and ability for pt to follow compensatory strategies. Pt's consumption listed as 20 to 50%, he is afebrile, with clear lungs, therefore appears to be tolerating, despite not following all compensatory strategies.  Rec continue diet with adherence to precautions as much as able encouraging intake.  Pt self fed today.      Diet Recommendation  Continue with Current Diet: Dysphagia 1 (puree);Nectar-thick liquid    SLP Plan Continue with current plan of care   Pertinent Vitals/Pain Afebrile, decreased, room air   Swallowing Goals  SLP Swallowing Goals Swallow Study Goal #2 - Progress: Progressing toward goal  General Temperature Spikes Noted: Yes Respiratory Status: Room air Behavior/Cognition: Doesn't follow directions;Alert;Requires cueing;Cooperative;Confused;Decreased sustained attention (pt has dementia) Oral Cavity - Dentition: Edentulous  Oral Cavity - Oral Hygiene Does patient have any of the following "at risk" factors?: Diet - patient on thickened liquids   Dysphagia Treatment Treatment focused on: Skilled observation of diet tolerance;Patient/family/caregiver education;Utilization of compensatory strategies Family/Caregiver Educated: pt Treatment Methods/Modalities: Skilled observation;Effortful swallow Patient observed directly with PO's: Yes Type of PO's observed: Dysphagia 1 (puree);Nectar-thick liquids Feeding: Able to feed self (needs assist to follow strategies, intermittently able) Liquids provided via: Cup Oral Phase Signs & Symptoms: Prolonged bolus formation;Prolonged oral phase Pharyngeal Phase Signs & Symptoms: Suspected delayed swallow  initiation;Multiple swallows;Wet vocal quality;Delayed throat clear Type of cueing: Verbal Amount of cueing: Total   GO     [name] [place], [place] [facility] [place] [phone]

## 2012-10-05 NOTE — Progress Notes (Signed)
Speech Language Pathology Dysphagia Treatment Patient Details Name: Kurt [name] MRN: [id number] DOB: [birth year] Today's Date: 10/05/2012 Time: 9563-8756 SLP Time Calculation (min): 12 min  Assessment / Plan / Recommendation Clinical Impression  Co-worker Dollene Cleveland) paged from Dr. Jomarie Longs regarding pt.'s severe dehydration which may outweigh the recommendation for thick liquids and to reassess him for thin.  SLP observed pt. consume thin water with prolonged oral cohesion and transit with slight indications of aspiration with throat clears.  Do not feel that his swallow function has significantly changed from MBS last week and do not recommend repeating MBS at this time.  Explained that pt. was a silent aspirator during MBS and therefore may not know if he is actually aspirating at present, although SLP suspects he is.  Family (daughter) understood and would like to try thin and accept aspiration risk.  Attempted to donn dentures but they would not stay in place.  SLP will follow up briefly for further education.    Diet Recommendation  Continue with Current Diet: Dysphagia 1 (puree);Nectar-thick liquid Initiate / Change Diet: Dysphagia 1 (puree);Thin liquid    SLP Plan Continue with current plan of care   Pertinent Vitals/Pain none   Swallowing Goals  SLP Swallowing Goals Patient will utilize recommended strategies during swallow to increase swallowing safety with: Maximal cueing Swallow Study Goal #2 - Progress: Progressing toward goal Goal #3: Pt will participate in therapeutic po trials with increased secretion management  (coughing, throat clear) with mod verbal cues  Swallow Study Goal #3 - Progress: Met Goal #4: Family will verbalize understanding of swallow precautions with min cues. Swallow Study Goal #4 - Progress: Progressing toward goal  General Temperature Spikes Noted: No Respiratory Status: Room air Behavior/Cognition: Alert;Pleasant mood;Cooperative;Requires  cueing Oral Cavity - Dentition: Edentulous (dentures ill fitting) Patient Positioning: Upright in chair  Oral Cavity - Oral Hygiene Does patient have any of the following "at risk" factors?: Diet - patient on thickened liquids;Nutritional status - inadequate;Other - dysphagia Brush patient's teeth BID with toothbrush (using toothpaste with fluoride): Yes Patient is HIGH RISK - Oral Care Protocol followed (see row info): Yes   Dysphagia Treatment Treatment focused on: Upgraded PO texture trials;Skilled observation of diet tolerance;Patient/family/caregiver Dealer Educated: daughter, wife Treatment Methods/Modalities: Skilled observation Patient observed directly with PO's: Yes Type of PO's observed: Thin liquids Feeding: Able to feed self;Needs assist Liquids provided via: Cup;No straw Oral Phase Signs & Symptoms: Prolonged oral phase;Prolonged bolus formation Pharyngeal Phase Signs & Symptoms: Suspected delayed swallow initiation;Immediate throat clear Type of cueing: Verbal Amount of cueing: Moderate   GO     [name] [place] [phone]  10/05/2012

## 2012-10-05 NOTE — Progress Notes (Signed)
Physical Therapy Treatment Patient Details Name: Kurt [name] MRN: [id number] DOB: April 12, 1929 Today's Date: 10/05/2012 Time: 4098-1191 PT Time Calculation (min): 12 min  PT Assessment / Plan / Recommendation Comments on Treatment Session  Gait and activity tolerance greatly improved.  Prior to session RN reported that pt required 3 person assist to stand from EOB.  Pt required mod assist with PT from low sitting recliner.      Follow Up Recommendations  SNF;Supervision/Assistance - 24 hour     Does the patient have the potential to tolerate intense rehabilitation     Barriers to Discharge        Equipment Recommendations  None recommended by PT    Recommendations for Other Services    Frequency Min 3X/week   Plan Discharge plan remains appropriate;Frequency remains appropriate    Precautions / Restrictions Precautions Precautions: Fall Precaution Comments: history of falling.   Restrictions Weight Bearing Restrictions: No   Pertinent Vitals/Pain No pain    Mobility  Bed Mobility Bed Mobility: Not assessed Details for Bed Mobility Assistance: Pt sitting in chair at nsg station.  Transfers Transfers: Sit to Stand;Stand to Sit Sit to Stand: 3: Mod assist;With upper extremity assist;From elevated surface;From chair/3-in-1 Sit to Stand: Patient Percentage: 70% Stand to Sit: 3: Mod assist;To chair/3-in-1;With upper extremity assist Stand to Sit: Patient Percentage: 70% Details for Transfer Assistance: manual facilitation to raise hips from chair, Pt placed hands on armrest of chair without prompting.    Ambulation/Gait Ambulation/Gait Assistance: 3: Mod assist Ambulation/Gait: Patient Percentage: 70% Ambulation Distance (Feet): 250 Feet Assistive device: Rolling walker Ambulation/Gait Assistance Details: manual facilitation to maintain balance initally.  Pt required assist for balance and walker management when changing direction.  Vcs to avoid obstacles in hallway.    Gait Pattern: Step-to pattern;Decreased stride length;Decreased hip/knee flexion - right;Decreased hip/knee flexion - left;Trunk flexed Gait velocity: quite slow Stairs: No Wheelchair Mobility Wheelchair Mobility: No    Exercises     PT Diagnosis:    PT Problem List:   PT Treatment Interventions:     PT Goals Acute Rehab PT Goals PT Goal Formulation: Patient unable to participate in goal setting Time For Goal Achievement: 10/14/12 Potential to Achieve Goals: Fair Pt will go Supine/Side to Sit: with modified independence Pt will go Sit to Supine/Side: with modified independence;with HOB 0 degrees Pt will go Sit to Stand: with supervision PT Goal: Sit to Stand - Progress: Progressing toward goal Pt will go Stand to Sit: with supervision PT Goal: Stand to Sit - Progress: Progressing toward goal Pt will Transfer Bed to Chair/Chair to Bed: with supervision PT Transfer Goal: Bed to Chair/Chair to Bed - Progress: Progressing toward goal Pt will Ambulate: with rolling walker;>150 feet;with supervision PT Goal: Ambulate - Progress: Updated due to goal met  Visit Information  Last PT Received On: 10/05/12 Assistance Needed: +2    Subjective Data  Subjective: agree to OOB with PT   Cognition  Cognition Arousal/Alertness: Awake/alert Behavior During Therapy: WFL for tasks assessed/performed Overall Cognitive Status: Impaired/Different from baseline Area of Impairment: Orientation Orientation Level: Disoriented to;Place;Time;Situation Following Commands: Follows one step commands consistently;Follows one step commands with increased time Safety/Judgement: Decreased awareness of safety General Comments: At times trying to come up OOB    Balance  Balance Balance Assessed: No  End of Session PT - End of Session Equipment Utilized During Treatment: Gait belt Activity Tolerance: Patient tolerated treatment well Patient left: in chair;Other (comment);with chair alarm set (at nsg  station )  Nurse Communication: Mobility status   GP     [name] 10/05/2012, 6:17 PM [name] L. [place] DPT [phone]

## 2012-10-05 NOTE — Progress Notes (Addendum)
[facility] Progress Note   Kurt [name] EAV:[id number] DOB: 09-Sep-1928 DOA: 09/27/2012 PCP: Nadean Corwin, MD  Brief narrative: 79 year old male with dementia, seizure disorder was discharged to SNF in March after admission for seizures. Per the patient's family he was lethargic, with Poor PO. In addition to these symptoms the patient also noticed increased swelling in the submandibular area on the right side that has progressively worsened over 24 hours which prompted the family to bring the patient to the emergency department. In the ER patient was found to have acute renal failure with metabolic acidosis and creatinine greater than 8 and severe hypernatremia, compared to his baseline creatinine of 1.85 back in March. CT of the neck soft tissue demonstrated possible submandibular infection. ENT physician was consulted. When initially evaluated by the admitting physician the patient was found to be mildly hypotensive and appeared quite dehydrated.  His renal function has slowly improved with hydration and Hypernatremia is slowly improving as well. Still on D5w for hypernatremia, completed antibiotics and steroid course for Sialoadenitis per ENT recs. Po intake was poor and slowly improving, mentation has improved significantly. Getting close to DC  Assessment/Plan:  ARF (acute renal failure) on CKD (chronic kidney disease), stage III -baseline Scr 1.85 -current Scr has trending down with IVF, urine output good,  -suspect ATN due to preadmit ARB plus hypotension due to anti-HTN meds and diuretics -Appreciate Renal consult, signed off - continue D5W at 75cc/hr -poor Po intake will continue to be a long term issue, this has been d/w daughter and the need to look at hospice down the road, unfortunately on nectar thick liquids and requested Speech to re-eval his diet  Acute Submandibular sialoadenitis - resolved -appreciate ENT assistance -completed 3 days of IV steroids and  empiric antibiotics IV  vanc and zosyn for 4 days,  -change to Po Augmentin 4/25 for 3 more days, completed Abx course -ENT ordered FNA  Per IR, will need to wait for anbx's treatment to complete first, needs to FU with Dr.Gore in 2 weeks and re-image then and determine need for biopsy  Dysphagia: Likely long standing, but not been formally accessed before -s/p swallow eval -Started on D1 Diet with nectar thick liquids -PO intake improving, but liquid intake very poor -requested speech to re-eval given improvement in mentation now and see if he could be upgraded to thin liquids  Hypernatremia/Dehydration -changed IVF to d5w 4/26 -bmet in am -encourage PO intake  Hypertension - stable - resume tenormin  - stop HCTZ due to dehydration and recurrent risk of this, stopped ARB (losartan)  Seizure disorder -Cont Keppra PO  Diabetes mellitus: CBGs running lowish -hold Metformin and would not resume after dc due to CKD -cut levemir off all together, sensitive SSI -cont SSI  DVT prophylaxis: SCDs  Code Status: DO NOT RESUSCITATE Family Communication: called and d/w daughter last on 4/28, no family at bedside as usual Disposition Plan: SNF in 1-2 days   Consultants: Nephrology ENT  Procedures: None  Antibiotics: Clindamycin 4/22 >>> 4/22 Zosyn 4/22 >>> Vancomycin 4/22 >>>4/24 Augmentin 4/24 - 4/28  HPI/Subjective: Much more alert and interactive today, ate all of his breakfast when fed   Objective: Blood pressure 117/70, pulse 83, temperature 98.2 F (36.8 C), temperature source Oral, resp. rate 18, height 6' 3.2" (1.91 m), weight 88.7 kg (195 lb 8.8 oz), SpO2 100.00%.  Intake/Output Summary (Last 24 hours) at 10/05/12 1035 Last data filed at 10/05/12 0848  Gross per 24 hour  Intake  130 ml  Output      0 ml  Net    130 ml   Exam: General: No acute respiratory distress, more alert and interactive, confused about the place and time ENT: R neck swelling  improving,  Moist oral mucous membranes , no JVD noted Lungs: Clear to auscultation bilaterally without wheezes or crackles, RA Cardiovascular: Regular rate and rhythm, systolic murmur, no gallop or rub normal S1 and S2 Abdomen: Nontender, nondistended, soft, bowel sounds positive, no rebound, no ascites, no appreciable mass Musculoskeletal: No significant cyanosis, clubbing of bilateral lower extremities Neurological: Alert and oriented x to self only, moves all extremities x 4 without focal neurological deficits  Data Reviewed: Basic Metabolic Panel:  Recent Labs Lab 09/29/12 0445 09/30/12 0510  10/01/12 0630 10/02/12 0510 10/03/12 0516 10/04/12 0810 10/05/12 0625  NA 158* 163*  < > 163* 163* 160* 155* 150*  K 3.5 3.2*  < > 3.7 3.4* 3.8 3.7 3.4*  CL 122* 126*  < > 129* 127* 127* 122* 117*  CO2 26 28  < > 27 28 26 27 26   GLUCOSE 233* 175*  < > 120* 102* 132* 104* 81  BUN 153* 126*  < > 93* 70* 53* 39* 35*  CREATININE 4.82* 3.76*  < > 2.88* 2.35* 2.18* 2.02* 2.10*  CALCIUM 9.9 10.4  < > 10.2 10.5 10.1 10.4 10.1  PHOS 3.9 3.9  --  3.2 3.3 2.7  --   --   < > = values in this interval not displayed. Liver Function Tests:  Recent Labs Lab 09/29/12 0445 09/30/12 0510 10/01/12 0630 10/02/12 0510 10/03/12 0516  AST 10  --   --   --   --   ALT 6  --   --   --   --   ALKPHOS 43  --   --   --   --   BILITOT 0.3  --   --   --   --   PROT 6.8  --   --   --   --   ALBUMIN 2.8* 2.8* 2.7* 2.7* 2.6*   CBC:  Recent Labs Lab 09/30/12 0510 10/01/12 0630 10/02/12 0510 10/03/12 0516 10/04/12 0810  WBC 10.3 6.7 5.5 5.5 7.6  HGB 10.2* 10.1* 10.6* 10.2* 10.7*  HCT 29.0* 29.5* 31.2* 30.2* 32.2*  MCV 86.3 88.3 88.9 89.3 89.7  PLT 101* 101* 98* 95* 91*   CBG:  Recent Labs Lab 10/03/12 2154 10/04/12 0729 10/04/12 1152 10/04/12 1835 10/04/12 2126  GLUCAP 108* 84 146* 79 93    Recent Results (from the past 240 hour(s))  CULTURE, BLOOD (ROUTINE X 2)     Status: None    Collection Time    09/27/12  8:45 PM      Result Value Range Status   Specimen Description BLOOD LEFT ARM   Final   Special Requests BOTTLES DRAWN AEROBIC AND ANAEROBIC 10CC EACH   Final   Culture  Setup Time 09/28/2012 02:16   Final   Culture NO GROWTH 5 DAYS   Final   Report Status 10/04/2012 FINAL   Final  CULTURE, BLOOD (ROUTINE X 2)     Status: None   Collection Time    09/27/12  8:55 PM      Result Value Range Status   Specimen Description BLOOD RIGHT HAND   Final   Special Requests BOTTLES DRAWN AEROBIC ONLY 5CC   Final   Culture  Setup Time 09/28/2012 02:16   Final  Culture NO GROWTH 5 DAYS   Final   Report Status 10/04/2012 FINAL   Final  MRSA PCR SCREENING     Status: Abnormal   Collection Time    09/28/12 11:34 AM      Result Value Range Status   MRSA by PCR POSITIVE (*) NEGATIVE Final   Comment:            The GeneXpert MRSA Assay (FDA     approved for NASAL specimens     only), is one component of a     comprehensive MRSA colonization     surveillance program. It is not     intended to diagnose MRSA     infection nor to guide or     monitor treatment for     MRSA infections.     RESULT CALLED TO, READ BACK BY AND VERIFIED WITH:     MATTHEWS,H RN @ 1349 ON 09/28/12 BY LEONARD,A     Studies:  Recent x-ray studies have been reviewed in detail by the Attending Physician  Scheduled Meds: 10/05/2012, 10:35 AM   LOS: 8 days   Zannie Cove, MD [phone] [facility]

## 2012-10-06 LAB — BASIC METABOLIC PANEL
BUN: 32 mg/dL — ABNORMAL HIGH (ref 6–23)
CO2: 26 mEq/L (ref 19–32)
GFR calc non Af Amer: 27 mL/min — ABNORMAL LOW (ref >90)
Glucose, Bld: 98 mg/dL (ref 70–99)
Potassium: 3.5 mEq/L (ref 3.5–5.1)
Sodium: 144 mEq/L (ref 135–145)

## 2012-10-06 LAB — GLUCOSE, CAPILLARY
Glucose-Capillary: 78 mg/dL (ref 70–99)
Glucose-Capillary: 96 mg/dL (ref 70–99)
Glucose-Capillary: 125 mg/dL — ABNORMAL HIGH (ref 70–99)

## 2012-10-06 MED ORDER — BOOST / RESOURCE BREEZE PO LIQD
1.0000 | Freq: Three times a day (TID) | ORAL | Status: DC
  Administered 2012-10-06 – 2012-10-07 (×4): 1 via ORAL

## 2012-10-06 NOTE — Progress Notes (Signed)
Occupational Therapy Treatment Patient Details Name: Kurt [name] MRN: [id number] DOB: 1928-06-17 Today's Date: 10/06/2012 Time: 4098-1191 OT Time Calculation (min): 29 min  OT Assessment / Plan / Recommendation Comments on Treatment Session Pt was in bed sleep upon arrival.  OTAS began to wipe his hands to wake him up.  Needed several VCs to open eyes.  Family walked in and pt became more alert.  Pt needed Max verbal and tactile cues during session today.     Follow Up Recommendations  SNF       Equipment Recommendations  None recommended by OT       Frequency Min 2X/week   Plan Discharge plan remains appropriate    Precautions / Restrictions Precautions Precautions: Fall Restrictions Weight Bearing Restrictions: No       ADL  Grooming: Performed;Maximal assistance;Wash/dry face Where Assessed - Grooming: Supine, head of bed up Toilet Transfer: Simulated;+2 Total assistance Toilet Transfer: Patient Percentage: 80% Toilet Transfer Method: Sit to Barista:  (EOB>to door>recliner) Equipment Used: Gait belt;Rolling walker Transfers/Ambulation Related to ADLs: pt ambualted from EOB>door>recliner with no LOB.  Pt needed Max VCs and Tactile cues to raise from the bed, hand placment from sit<>stand, and rw safety ADL Comments: Pt was able to bring washcloth to mouth after 3 VCs then OTAS washed the rest of his face      OT Goals ADL Goals ADL Goal: Grooming - Progress: Progressing toward goals Miscellaneous OT Goals OT Goal: Miscellaneous Goal #2 - Progress: Progressing toward goals OT Goal: Miscellaneous Goal #3 - Progress: Progressing toward goals  Visit Information  Last OT Received On: 10/06/12 Assistance Needed: +2    Subjective Data  Subjective: I'm a little tired(when asked if he was tired)      Cognition  Cognition Arousal/Alertness: Lethargic Behavior During Therapy: WFL for tasks assessed/performed Overall Cognitive Status:  Impaired/Different from baseline Area of Impairment: Memory;Following commands;Problem solving Memory:  (multiple times repeating what he needed to try and do) Following Commands: Follows one step commands with increased time;Follows one step commands inconsistently Safety/Judgement: Decreased awareness of safety;Decreased awareness of deficits Problem Solving: Decreased initiation;Requires verbal cues;Requires tactile cues;Difficulty sequencing;Slow processing General Comments: Needed Max Vcs for Bed mobility sequencing    Mobility  Bed Mobility Bed Mobility: Rolling Right;Right Sidelying to Sit Rolling Right: With rail;4: Min assist Right Sidelying to Sit: HOB flat;2: Max assist Details for Bed Mobility Assistance: Pt was able to A with LB but needed A with getting legs fully out of the bed.  Pt needed most A with UB Transfers Transfers: Sit to Stand;Stand to Sit Sit to Stand: With upper extremity assist;From bed;From elevated surface;1: +2 Total assist Sit to Stand: Patient Percentage: 80% Stand to Sit: With armrests;To chair/3-in-1;With upper extremity assist;3: Mod assist;To elevated surface Details for Transfer Assistance: Pt needed verbal and tacticle cues to stand from EOB.  Once standing needed VCs to stand tall and grab rw.  Pt needed cueing to keep rw with him while transferring          End of Session OT - End of Session Equipment Utilized During Treatment: Gait belt Activity Tolerance:  (nursing reports he was up all night) Patient left: in chair;with call bell/phone within reach;with chair alarm set;with nursing in room;with family/visitor present (wife and daughter) Nurse Communication:  (Nursing in room when pt was up on his feet with [place])       Mayford Knife, [place] 10/06/2012, 1:47 PM

## 2012-10-06 NOTE — Progress Notes (Signed)
I have reviewed and agree with this note.  Ignacia Palma, [place] [phone] 10/06/2012

## 2012-10-06 NOTE — Progress Notes (Signed)
NUTRITION FOLLOW UP  DOCUMENTATION CODES  Per approved criteria   -Severe malnutrition in the context of acute illness or injury    Intervention:   1. Add Resource Breeze po TID, each supplement provides 250 kcal and 9 grams of protein. 2. Continue Ensure Pudding po TID, each supplement provides 170 kcal and 4 grams of protein.  3. Oral intake continues to be poor, strongly recommend initiation of short-term nutrition support - if within GOC. 4. RD to continue to follow nutrition care plan  Nutrition Dx:   Swallowing difficulty related to mandibular infection as evidenced by need for modified diet and liquids. Ongoing.  Goal:   Intake to meet >90% of estimated nutrition needs. Unmet.  Monitor:   weight trends, lab trends, I/O's, PO intake, supplement tolerance  Assessment:   Recently admitted for seizures, d/c to SNF. Difficulty with eating x 1 week. Work-up revealed submandibular sialoadenitis.  Sodium declining; most recent sodium WNL at 144. Per most recent MD note -- "poor PO intake will continue to be a long-term issue, this has been d/w dtr and they will need to look at hospice down the road." Family requested SLP to re-evaluate diet.  MD paged SLP yesterday noting that pt's severe dehydration may outweigh the recommendation for thickened liquids - asked SLP to evaluate for thin liquids. SLP observed pt consume thins and recommended continuation of thickened liquids, noting that pt's swallowing function has not improved significantly enough to warrant follow-up MBSS at this time. Family agreeable to thin liquids with known aspiration risk. Diet changed to Dysphagia 1 with thin liquids accordingly.  Pt didn't eat breakfast this morning - per RN, pt was too sleepy.  Height: Ht Readings from Last 1 Encounters:  09/27/12 6' 3.2" (1.91 m)    Weight Status:   Wt Readings from Last 1 Encounters:  10/03/12 195 lb 8.8 oz (88.7 kg)  Stable  Re-estimated needs:  Kcal: 1800 -  2000 Protein: 60 - 75 grams Fluid: 1.8 - 2 liters daily  Skin: intact  Diet Order: Dysphagia 1; thins   Intake/Output Summary (Last 24 hours) at 10/06/12 0935 Last data filed at 10/06/12 0708  Gross per 24 hour  Intake   1155 ml  Output      4 ml  Net   1151 ml    Last BM: 4/28   Labs:   Recent Labs Lab 10/01/12 0630 10/02/12 0510 10/03/12 0516 10/04/12 0810 10/05/12 0625 10/06/12 0730  NA 163* 163* 160* 155* 150* 144  K 3.7 3.4* 3.8 3.7 3.4* 3.5  CL 129* 127* 127* 122* 117* 112  CO2 27 28 26 27 26 26   BUN 93* 70* 53* 39* 35* 32*  CREATININE 2.88* 2.35* 2.18* 2.02* 2.10* 2.15*  CALCIUM 10.2 10.5 10.1 10.4 10.1 9.5  PHOS 3.2 3.3 2.7  --   --   --   GLUCOSE 120* 102* 132* 104* 81 98    CBG (last 3)   Recent Labs  10/05/12 1714 10/05/12 2231 10/06/12 0823  GLUCAP 69* 196* 78    Scheduled Meds: . atenolol  50 mg Oral Daily  . feeding supplement  1 Container Oral TID BM  . finasteride  5 mg Oral Daily  . insulin aspart  0-9 Units Subcutaneous TID WC  . levETIRAcetam  500 mg Oral BID  . multivitamin with minerals  1 tablet Oral Daily  . pantoprazole  40 mg Oral Q1200  . sodium chloride  3 mL Intravenous Q12H  Continuous Infusions: . dextrose 75 mL/hr at 10/05/12 [address] [place], [place], [place] Pager: [phone] After-hours pager: [phone]

## 2012-10-06 NOTE — Progress Notes (Addendum)
[facility] PROGRESS NOTE  Kurt [name]:[id number] DOB: 1928/09/20 DOA: 09/27/2012 PCP: Nadean Corwin, MD  Assessment/Plan: Active Problems:   Seizure disorder, with left leg weakness (Todds phenomenon)   Hypertension   Diabetes mellitus without complication   CKD (chronic kidney disease), stage III   ARF (acute renal failure)   Acute Submandibular sialoadenitis   Hypernatremia   Dehydration   Dysphagia   Dementia     Brief narrative:  79 year old male with dementia, seizure disorder was discharged to SNF in March after admission for seizures.  Per the patient's family he was lethargic, with Poor PO. In addition to these symptoms the patient also noticed increased swelling in the submandibular area on the right side that has progressively worsened over 24 hours which prompted the family to bring the patient to the emergency department. In the ER patient was found to have acute renal failure with metabolic acidosis and creatinine greater than 8 and severe hypernatremia, compared to his baseline creatinine of 1.85 back in March. CT of the neck soft tissue demonstrated possible submandibular infection. ENT physician was consulted. When initially evaluated by the admitting physician the patient was found to be mildly hypotensive and appeared quite dehydrated.  His renal function has slowly improved with hydration and Hypernatremia is slowly improving as well.  Still on D5w for hypernatremia, completed antibiotics and steroid course for Sialoadenitis per ENT recs.  Po intake was poor and slowly improving, mentation has improved significantly.  Getting close to DC    Assessment/Plan:  ARF (acute renal failure) on CKD (chronic kidney disease), stage III  -baseline Scr 1.85  -current Scr has trending down with IVF, urine output good,  -suspect ATN due to preadmit ARB plus hypotension due to anti-HTN meds and diuretics  -Appreciate Renal consult, signed off  - continue  D5W at 75cc/hr  -poor Po intake will continue to be a long term issue, this has been d/w daughter and the need to look at hospice down the road, unfortunately on nectar thick liquids and requested Speech to re-eval his diet     Acute Submandibular sialoadenitis  - resolved  -appreciate ENT assistance  -completed 3 days of IV steroids and empiric antibiotics IV vanc and zosyn for 4 days,  -change to Po Augmentin 4/25 for 3 more days, completed Abx course  -ENT ordered FNA Per IR, will need to wait for anbx's treatment to complete first, needs to FU with Dr.Gore in 2 weeks and re-image then and determine need for biopsy     Dysphagia: Likely long standing, but not been formally accessed before  -s/p swallow eval  -Started on D1 Diet with nectar thick liquids  -PO intake improving, but liquid intake very poor  -requested speech to re-eval given improvement in mentation now and see if he could be upgraded to thin liquids    Hypernatremia/Dehydration  -changed IVF to d5w 4/26  -bmet in am  -encourage PO intake    Hypertension  - stable  - resume tenormin  - stop HCTZ due to dehydration and recurrent risk of this, stopped ARB (losartan)    Seizure disorder  -Cont Keppra PO   Diabetes mellitus: CBGs running lowish  -hold Metformin and would not resume after dc due to CKD  -cut levemir off all together, sensitive SSI  -cont SSI    DVT prophylaxis: SCDs  Code Status: DO NOT RESUSCITATE  Family Communication: called and d/w daughter last on 4/28, no family at bedside as usual  Disposition  Plan: SNF in 1-2 days  Consultants:  Nephrology  ENT  Procedures:  None  Antibiotics:  Clindamycin 4/22 >>> 4/22  Zosyn 4/22 >>>  Vancomycin 4/22 >>>4/24  Augmentin 4/24 - 4/28    HPI/Subjective:  Confused , this is my first visit  Muffled speech  Objective: Filed Vitals:   10/05/12 0952 10/05/12 1406 10/05/12 1826 10/05/12 2215  BP: 117/70 120/69 126/77 129/90  Pulse: 83 82  83 72  Temp: 98.2 F (36.8 C) 98.4 F (36.9 C) 97.5 F (36.4 C) 97.3 F (36.3 C)  TempSrc: Oral Oral Oral Oral  Resp: 18 18 19 20   Height:      Weight:      SpO2: 100% 100% 100% 98%    Intake/Output Summary (Last 24 hours) at 10/06/12 0826 Last data filed at 10/06/12 0708  Gross per 24 hour  Intake   1275 ml  Output      4 ml  Net   1271 ml    Exam:  HENT:  Head: Atraumatic.  Nose: Nose normal.  Mouth/Throat: Oropharynx is clear and moist.  Eyes: Conjunctivae are normal. Pupils are equal, round, and reactive to light. No scleral icterus.  Neck: Neck supple. No tracheal deviation present.  Cardiovascular: Normal rate, regular rhythm, normal heart sounds and intact distal pulses.  Pulmonary/Chest: Effort normal and breath sounds normal. No respiratory distress.  Abdominal: Soft. Normal appearance and bowel sounds are normal. She exhibits no distension. There is no tenderness.  Musculoskeletal: She exhibits no edema and no tenderness.  Neurological: She is alert. No cranial nerve deficit.    Data Reviewed: Basic Metabolic Panel:  Recent Labs Lab 09/30/12 0510  10/01/12 0630 10/02/12 0510 10/03/12 0516 10/04/12 0810 10/05/12 0625  NA 163*  < > 163* 163* 160* 155* 150*  K 3.2*  < > 3.7 3.4* 3.8 3.7 3.4*  CL 126*  < > 129* 127* 127* 122* 117*  CO2 28  < > 27 28 26 27 26   GLUCOSE 175*  < > 120* 102* 132* 104* 81  BUN 126*  < > 93* 70* 53* 39* 35*  CREATININE 3.76*  < > 2.88* 2.35* 2.18* 2.02* 2.10*  CALCIUM 10.4  < > 10.2 10.5 10.1 10.4 10.1  PHOS 3.9  --  3.2 3.3 2.7  --   --   < > = values in this interval not displayed.  Liver Function Tests:  Recent Labs Lab 09/30/12 0510 10/01/12 0630 10/02/12 0510 10/03/12 0516  ALBUMIN 2.8* 2.7* 2.7* 2.6*   No results found for this basename: LIPASE, AMYLASE,  in the last 168 hours No results found for this basename: AMMONIA,  in the last 168 hours  CBC:  Recent Labs Lab 09/30/12 0510 10/01/12 0630  10/02/12 0510 10/03/12 0516 10/04/12 0810  WBC 10.3 6.7 5.5 5.5 7.6  HGB 10.2* 10.1* 10.6* 10.2* 10.7*  HCT 29.0* 29.5* 31.2* 30.2* 32.2*  MCV 86.3 88.3 88.9 89.3 89.7  PLT 101* 101* 98* 95* 91*    Cardiac Enzymes: No results found for this basename: CKTOTAL, CKMB, CKMBINDEX, TROPONINI,  in the last 168 hours BNP (last 3 results) No results found for this basename: PROBNP,  in the last 8760 hours   CBG:  Recent Labs Lab 10/04/12 1835 10/04/12 2126 10/05/12 1151 10/05/12 1714 10/05/12 2231  GLUCAP 79 93 141* 69* 196*    Recent Results (from the past 240 hour(s))  CULTURE, BLOOD (ROUTINE X 2)     Status: None   Collection  Time    09/27/12  8:45 PM      Result Value Range Status   Specimen Description BLOOD LEFT ARM   Final   Special Requests BOTTLES DRAWN AEROBIC AND ANAEROBIC 10CC EACH   Final   Culture  Setup Time 09/28/2012 02:16   Final   Culture NO GROWTH 5 DAYS   Final   Report Status 10/04/2012 FINAL   Final  CULTURE, BLOOD (ROUTINE X 2)     Status: None   Collection Time    09/27/12  8:55 PM      Result Value Range Status   Specimen Description BLOOD RIGHT HAND   Final   Special Requests BOTTLES DRAWN AEROBIC ONLY 5CC   Final   Culture  Setup Time 09/28/2012 02:16   Final   Culture NO GROWTH 5 DAYS   Final   Report Status 10/04/2012 FINAL   Final  MRSA PCR SCREENING     Status: Abnormal   Collection Time    09/28/12 11:34 AM      Result Value Range Status   MRSA by PCR POSITIVE (*) NEGATIVE Final   Comment:            The GeneXpert MRSA Assay (FDA     approved for NASAL specimens     only), is one component of a     comprehensive MRSA colonization     surveillance program. It is not     intended to diagnose MRSA     infection nor to guide or     monitor treatment for     MRSA infections.     RESULT CALLED TO, READ BACK BY AND VERIFIED WITH:     MATTHEWS,H RN @ 1349 ON 09/28/12 BY LEONARD,A     Studies: Dg Chest 2 View  09/27/2012  *RADIOLOGY  REPORT*  Clinical Data: altered mental status  CHEST - 2 VIEW  Comparison: 08/30/2012  Findings: Lungs remain clear.  Negative for heart failure or pneumonia.  No mass lesion.  Heart size is normal.  IMPRESSION: No acute abnormality.   Original Report Authenticated By: Janeece Riggers, M.D.    Ct Soft Tissue Neck Wo Contrast  09/27/2012  *RADIOLOGY REPORT*  Clinical Data: Palpable mass right neck.  Difficulty swallowing.  History of meningioma resection skull base.  CT NECK WITHOUT CONTRAST  Technique:  Multidetector CT imaging of the neck was performed without intravenous contrast.  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  Based on the prior MRI there is residual meningioma present.  There is encephalomalacia in the inferior frontal lobes. There is obstruction of the sphenoid sinus which is filled with secretions, similar to prior imaging.  This is incompletely evaluated on the study of the neck.  Right submandibular gland is enlarged and markedly asymmetric when compared to the left.  The right gland measures approximately 2 x 3 cm.  Left gland is   atrophic measuring approximately 9 x 19 mm. The right submandibular gland is enlarged and there is some stranding in the surrounding fat and thickening of the right platysmus muscle.  Small submental nodes are present on the right. There is asymmetry of the floor the mouth on the right.  Without intravenous contrast, evaluation is limited.  No pathologically enlarged lymph nodes.  Thyroid is normal.  Lung apices are clear.  Parotid gland is normal bilaterally. Cervical disc degeneration and spondylosis without acute bony abnormality. There is thickening  and calcification of the transverse ligament of C1.  This is causing spinal stenosis.  IMPRESSION: Enlargement of the right submandibular gland with some stranding in the surrounding fat.  There is asymmetric soft tissue thickening  of the floor the mouth on the right.  The patient's white blood count is elevated today and this may be due to acute infection.  Lack of intravenous contrast makes it difficult to evaluate for abscess. Neoplasm in the submandibular and also possible.  Given the elevated white count, treatment for infection is suggested.  If the palpable mass persists after treatment repeat CT neck with contrast is suggested to exclude a mass lesion.  Postop changes of meningioma resection with chronic sinusitis.   Original Report Authenticated By: Janeece Riggers, M.D.    US Renal  09/27/2012  *RADIOLOGY REPORT*  Clinical Data: Mass  RENAL/URINARY TRACT ULTRASOUND COMPLETE  Comparison:  Renal ultrasound 05/09/2009  Findings:  Right Kidney:  11.8 cm.  Limited visualization of the right kidney due to overlying bowel gas.  Increased echogenicity of the cortex. Right upper pole cyst 2.5 x 3.0 cm.  Cyst in the left lower pole containing internal septations measuring 4.5 x 6.7 cm.  This is probably benign.  Negative for obstruction.  Left Kidney:  11.1 cm in length.  3 x 4 cm left upper pole cyst.  3 x 3 cm left upper pole cyst.  Bladder:  Empty bladder appear  IMPRESSION: Bilateral renal cysts.  No obstruction.   Original Report Authenticated By: Janeece Riggers, M.D.    Dg Swallowing Func-speech Pathology  09/30/2012  [name] [place], CCC-SLP     09/30/2012 12:50 PM Objective Swallowing Evaluation: Modified Barium Swallowing Study   Patient Details  Name: Kurt [name] MRN: [id number] Date of Birth: 1929/04/12  Today's Date: 09/30/2012 Time: 1110-1135 SLP Time Calculation (min): 25 min  Past Medical History:  Past Medical History  Diagnosis Date  . Hypertension   . Hyperlipidemia   . Seizure disorder   . CKD (chronic kidney disease), stage III   . Altered mental state 08/31/2012    "came into hospital a little disoriented; it's gotten worse;  this is not normal" (08/31/2012)  . Seizures   . Type II diabetes mellitus   . Chronic lower back  pain    Past Surgical History:  Past Surgical History  Procedure Laterality Date  . Brain tumor excision    . Brain meningioma excision  08/2000   HPI:  Khyrie Masi is an 79 y.o. male  with acute renal failure and  right neck swelling. Pt c/o difficulty swallowing for past week.  CT neck without contrast showed hypotrophic left 9mm  submandibular gland and right homogeneous 2x3 cm submandibular  gland without obvious mass or abscess and surrounding soft tissue  stranding likely submandibular sialoadenitis. Pt started on  antibiotics for treatment of mass.  Bedside swallow assessment  recommended MBS.     Assessment / Plan / Recommendation Clinical Impression  Dysphagia Diagnosis: Severe oral phase dysphagia;Moderate  pharyngeal phase dysphagia;Severe pharyngeal phase dysphagia Clinical impression: Pt. demonstrated severe oral dysphagia with  poor ability to form bolus, maintain cohesion leading to residue  throughout oral cavity and transit delays.  Pharyngeal phase  includes moderate-severe impairments both sensory and motor  tracts affected.  Delayed swallow initiation, reduced pharyngeal  contraction. decreased laryngeal elevation and epiglottic closure  led to moderate-severe vallecular and moderate pyriform sinus  residue throughout study.  Frank aspiration of thin before and  during swallow (  basically silent with one minimal throat clear).   Pt. does not sense majority of residue and was unable to  effectively swallow additional times with therapeutic  intervention via verbal/visual cues (several times a reflexive  2nd swallow performed and cleared mild amount).  Unfortunately at  this time he aspiration does not appear preventable with any  diet/liquid modifications.  Pt. confused with dementia and unable  to perform compensatory techniques.  Dys 1 and nectar thick may  have the least potential to penetrate/aspirate, however not  guaranteed.  Discussed with Dr. Jomarie Longs and explained clinical  findings and  results of swallow assessments with precautions to  pt.'s family (mom has dementia, 2 daughters present).  ST will  continue to follow for education.       Treatment Recommendation  Therapy as outlined in treatment plan below    Diet Recommendation Dysphagia 1 (Puree);Nectar-thick liquid   Liquid Administration via: Cup;No straw Medication Administration: Crushed with puree Supervision: Full supervision/cueing for compensatory strategies Compensations: Slow rate;Small sips/bites;Check for  pocketing;Multiple dry swallows after each bite/sip Postural Changes and/or Swallow Maneuvers: Seated upright 90  degrees    Other  Recommendations Oral Care Recommendations: Oral care QID   Follow Up Recommendations   (to be determined)    Frequency and Duration min 2x/week  2 weeks   Pertinent Vitals/Pain     SLP Swallow Goals Patient will utilize recommended strategies during swallow to  increase swallowing safety with: Maximal cueing Goal #3: Pt will participate in therapeutic po trials with  increased secretion management  (coughing, throat clear) with mod  verbal cues  Swallow Study Goal #3 - Progress: Met Goal #4: Family will verbalize understanding of swallow  precautions with min cues.      Reason for Referral Objectively evaluate swallowing function   Oral Phase Oral Preparation/Oral Phase Oral Phase: Impaired Oral - Honey Oral - Honey Teaspoon: Weak lingual manipulation;Right pocketing  in lateral sulci;Left pocketing in lateral sulci;Delayed oral  transit;Reduced posterior propulsion;Lingual/palatal residue Oral - Honey Cup: Weak lingual manipulation;Right pocketing in  lateral sulci;Left pocketing in lateral sulci;Delayed oral  transit;Reduced posterior propulsion;Lingual/palatal residue  (sublingual residue) Oral - Nectar Oral - Nectar Teaspoon: Weak lingual manipulation;Right pocketing  in lateral sulci;Left pocketing in lateral sulci;Delayed oral  transit;Reduced posterior propulsion;Lingual/palatal residue Oral -  Nectar Cup: Weak lingual manipulation;Right pocketing in  lateral sulci;Left pocketing in lateral sulci;Delayed oral  transit;Reduced posterior propulsion;Lingual/palatal residue Oral - Thin Oral - Thin Teaspoon: Weak lingual manipulation;Right pocketing  in lateral sulci;Left pocketing in lateral sulci;Delayed oral  transit;Reduced posterior propulsion;Lingual/palatal residue Oral - Thin Cup: Weak lingual manipulation;Right pocketing in  lateral sulci;Left pocketing in lateral sulci;Delayed oral  transit;Reduced posterior propulsion;Lingual/palatal residue Oral - Solids Oral - Puree: Weak lingual manipulation;Right pocketing in  lateral sulci;Left pocketing in lateral sulci;Delayed oral  transit;Reduced posterior propulsion;Lingual/palatal residue   Pharyngeal Phase Pharyngeal Phase Pharyngeal Phase: Impaired Pharyngeal - Honey Pharyngeal - Honey Teaspoon: Delayed swallow initiation;Premature  spillage to valleculae;Premature spillage to pyriform  sinuses;Pharyngeal residue - valleculae;Pharyngeal residue -  pyriform sinuses;Reduced tongue base retraction;Reduced  pharyngeal peristalsis;Reduced laryngeal elevation Pharyngeal - Honey Cup: Delayed swallow initiation;Premature  spillage to valleculae;Premature spillage to pyriform  sinuses;Pharyngeal residue - valleculae;Pharyngeal residue -  pyriform sinuses;Reduced tongue base retraction;Reduced  pharyngeal peristalsis;Reduced laryngeal elevation Pharyngeal - Nectar Pharyngeal - Nectar Teaspoon: Delayed swallow  initiation;Premature spillage to valleculae;Premature spillage to  pyriform sinuses;Pharyngeal residue - valleculae;Pharyngeal  residue - pyriform sinuses;Reduced tongue base retraction;Reduced  pharyngeal peristalsis;Reduced laryngeal elevation Pharyngeal -  Nectar Cup: Delayed swallow initiation;Premature  spillage to valleculae;Premature spillage to pyriform  sinuses;Pharyngeal residue - valleculae;Pharyngeal residue -  pyriform sinuses;Reduced tongue base  retraction;Reduced  pharyngeal peristalsis;Reduced laryngeal elevation;Reduced  anterior laryngeal mobility;Reduced airway/laryngeal  closure;Penetration/Aspiration during swallow Penetration/Aspiration details (nectar cup): Material enters  airway, remains ABOVE vocal cords and not ejected out Pharyngeal - Thin Pharyngeal - Thin Teaspoon: Delayed swallow initiation;Premature  spillage to valleculae;Premature spillage to pyriform  sinuses;Pharyngeal residue - valleculae;Pharyngeal residue -  pyriform sinuses;Reduced tongue base retraction;Reduced  pharyngeal peristalsis;Reduced laryngeal  elevation;Penetration/Aspiration during swallow;Reduced anterior  laryngeal mobility;Reduced airway/laryngeal closure Penetration/Aspiration details (thin teaspoon): Material enters  airway, remains ABOVE vocal cords and not ejected out Pharyngeal - Thin Cup: Delayed swallow initiation;Premature  spillage to valleculae;Premature spillage to pyriform  sinuses;Pharyngeal residue - valleculae;Pharyngeal residue -  pyriform sinuses;Reduced tongue base retraction;Reduced  pharyngeal peristalsis;Reduced laryngeal  elevation;Penetration/Aspiration during swallow;Reduced anterior  laryngeal mobility;Reduced airway/laryngeal  closure;Penetration/Aspiration before swallow;Significant  aspiration (Amount) Penetration/Aspiration details (thin cup): Material enters  airway, passes BELOW cords without attempt by patient to eject  out (silent aspiration) (slight throat clear) Pharyngeal - Solids Pharyngeal - Puree: Delayed swallow initiation;Pharyngeal residue  - valleculae;Pharyngeal residue - pyriform sinuses;Reduced tongue  base retraction;Reduced pharyngeal peristalsis;Reduced laryngeal  elevation;Premature spillage to valleculae  Cervical Esophageal Phase    GO    Cervical Esophageal Phase Cervical Esophageal Phase: [facility]         Darrow Bussing.Ed CCC-SLP Pager [phone]  09/30/2012      Scheduled Meds: . atenolol  50 mg Oral Daily   . feeding supplement  1 Container Oral TID BM  . finasteride  5 mg Oral Daily  . insulin aspart  0-9 Units Subcutaneous TID WC  . levETIRAcetam  500 mg Oral BID  . multivitamin with minerals  1 tablet Oral Daily  . pantoprazole  40 mg Oral Q1200  . sodium chloride  3 mL Intravenous Q12H   Continuous Infusions: . dextrose 75 mL/hr at 10/05/12 1330    Active Problems:   Seizure disorder, with left leg weakness (Todds phenomenon)   Hypertension   Diabetes mellitus without complication   CKD (chronic kidney disease), stage III   ARF (acute renal failure)   Acute Submandibular sialoadenitis   Hypernatremia   Dehydration   Dysphagia   Dementia    Time spent: 40 minutes   [facility]  [facility] Pager [phone]. If 8PM-8AM, please contact night-coverage at [email or web address], password [facility] 10/06/2012, 8:26 AM  LOS: 9 days

## 2012-10-07 LAB — BASIC METABOLIC PANEL
BUN: 27 mg/dL — ABNORMAL HIGH (ref 6–23)
Chloride: 109 mEq/L (ref 96–112)
GFR calc Af Amer: 34 mL/min — ABNORMAL LOW (ref >90)
GFR calc non Af Amer: 29 mL/min — ABNORMAL LOW (ref >90)
Potassium: 4.2 mEq/L (ref 3.5–5.1)
Sodium: 141 mEq/L (ref 135–145)

## 2012-10-07 LAB — GLUCOSE, CAPILLARY
Glucose-Capillary: 111 mg/dL — ABNORMAL HIGH (ref 70–99)
Glucose-Capillary: 105 mg/dL — ABNORMAL HIGH (ref 70–99)
Glucose-Capillary: 132 mg/dL — ABNORMAL HIGH (ref 70–99)

## 2012-10-07 NOTE — Progress Notes (Signed)
Speech Language Pathology Dysphagia Treatment Patient Details Name: Kurt [name] MRN: [id number] DOB: [birth year] Today's Date: 10/07/2012 Time: 4098-1191 SLP Time Calculation (min): 41 min  Assessment / Plan / Recommendation Clinical Impression  Pt is scheduled for DC today.  SLP reviewed safe swallow precautions and discussed importance of thorough oral care before and after meals.  Pt was encouraged to not wear ill-fitting dentures during meals, as this could increase difficulty and decrease safety.  Precaution sheet was provided for pt to take at DC. RN was informed of this, and encouraged to send it with pt belongings at DC. Pt was observed with thin liquids, with min verbal cues to adhere to precautions. Pt verbalized understanding of precautions/oral care.      Diet Recommendation  Continue with Current Diet: Dysphagia 1 (puree);Thin liquid    SLP Plan Continue with current plan of care   Pertinent Vitals/Pain No pain reported.   Swallowing Goals  SLP Swallowing Goals Swallow Study Goal #2 - Progress: Met Swallow Study Goal #3 - Progress: Met Swallow Study Goal #4 - Progress: Partly met (Pt verbalizes understanding. No family present today.)  General Temperature Spikes Noted: No Respiratory Status: Room air Behavior/Cognition: Alert;Cooperative;Pleasant mood;Requires cueing Oral Cavity - Dentition: Edentulous Patient Positioning: Upright in bed  Oral Cavity - Oral Hygiene Does patient have any of the following "at risk" factors?: Other - dysphagia Brush patient's teeth BID with toothbrush (using toothpaste with fluoride): Yes Patient is AT RISK - Oral Care Protocol followed (see row info): Yes   Dysphagia Treatment Treatment focused on: Skilled observation of diet tolerance;Patient/family/caregiver education Treatment Methods/Modalities: Skilled observation Patient observed directly with PO's: Yes Type of PO's observed: Thin liquids Feeding: Able to feed self;Needs  assist Liquids provided via: Cup;No straw Pharyngeal Phase Signs & Symptoms: Suspected delayed swallow initiation Type of cueing: Verbal Amount of cueing: Minimal   Amariana Mirando B. Murvin [facility], CCC-SLP [phone]  Kurt [name] 10/07/2012, 10:16 AM

## 2012-10-07 NOTE — Discharge Summary (Addendum)
PCP: Nadean Corwin, MD  Admit date: 09/27/2012  Discharge date: 10/07/12   Time spent: 45 minutes  Recommendations for Outpatient Follow-up:  1. PCP in 1 week 2. Bmet in 4-5 days 3. Speech therapy follow up at SNF 4.  5.  Discharge Diagnoses:  ARF (acute renal failure)  Acute Submandibular sialoadenitis  Hypernatremia  Dehydration  Dysphagia  Dementia  Seizure disorder, with left leg weakness (Todds phenomenon)  Hypertension  Diabetes mellitus without complication  CKD (chronic kidney disease), stage III   Discharge Condition: stable  Diet recommendation: Dysphagia 1 diet with thin liquids  Filed Weights    10/01/12 2029  10/02/12 2124  10/03/12 2155   Weight:  87.4 kg (192 lb 10.9 oz)  88.225 kg (194 lb 8 oz)  88.7 kg (195 lb 8.8 oz)      History of present illness:  Hospital Course:  79 year old male with dementia, seizure disorder was discharged to SNF in March after admission for seizures.  Per the patient's family he was lethargic, with Poor PO. In addition to these symptoms the patient also noticed increased swelling in the submandibular area on the right side that has progressively worsened over 24 hours which prompted the family to bring the patient to the emergency department. In the ER patient was found to have acute renal failure with metabolic acidosis and creatinine greater than 8 and severe hypernatremia, compared to his baseline creatinine of 1.85 back in March. CT of the neck soft tissue demonstrated possible submandibular infection. ENT physician was consulted. When initially evaluated by the admitting physician the patient was found to be mildly hypotensive and appeared quite dehydrated.  His renal function has slowly improved with hydration and Hypernatremia is slowly improving as well.  Still on D5w for hypernatremia, completed antibiotics and steroid course for Sialoadenitis per ENT recs.  Po intake was poor and slowly improving, Hypernatremia is  improving and mentation has improved significantly.    ARF (acute renal failure) on CKD (chronic kidney disease), stage III  -baseline Scr 1.85 , creatinine is trending down with IVF, urine output good,  -suspect ATN due to preadmit ARB plus hypotension due to anti-HTN meds and diuretics  -Was seen by Renal in consultation then signed off  - Treated with D5W at 75cc/hr for several days sodium down to 144 -poor Po intake will continue to be a long term issue, this has been d/w daughter and the need to look at hospice down the road if he deteriorates again.  Encourage water intake as much as possible    Acute Submandibular sialoadenitis  - resolved  - Was seen by ENT , Dr.Gore in consultation  -completed 3 days of IV steroids and empiric antibiotics IV vanc and zosyn for 4 days,  -then changed to Po Augmentin 4/25 for 3 more days, completed Abx course.  -pain and swelling has completely resolved.  -In terms of need for biopsy, can cancel US-guided FNA unless swelling or mass returns since this has essentially resolved with antibiotic treatment according to Dr.gore . Should hydrate and use sialogogues , Follow as needed    Dysphagia: Likely long standing, but not been formally accessed before  -s/p swallow eval  -Started on D1 Diet with nectar thick liquids  -PO intake improving, but liquid intake very poor  -requested speech to re-eval given improvement in mentation now and see if he could be upgraded to thin liquids, s/p re-evaluation by speech started on thin liquids accepting some aspiration risk as discussed with  daughter.    Hypernatremia/Dehydration  -was initially on D5 1/2 NS then changed IVF to d5w on 4/26  -Na is slowly trended down, mentation improved  -bmet  weekly  -encourage PO intake    Hypertension  - stable  - resume tenormin  - stop HCTZ due to dehydration and recurrent risk of this, stopped ARB (losartan) as well.    Seizure disorder  -Cont Keppra PO     Diabetes mellitus: CBGs running lowish  -hold Metformin and would not resume after dc due to CKD  -cut levemir off all together, sensitive SSI  -cont SSI    Code Status: DO NOT RESUSCITATE  Disposition Plan: SNF in 1-2 days  Procedures:  Speech therapy eval- MBS Consultations:  Renal Speech therapy Discharge Exam:  Filed Vitals:    10/05/12 0500  10/05/12 0952  10/05/12 1406  10/05/12 1826   BP:  156/92  117/70  120/69  126/77   Pulse:  75  83  82  83   Temp:  97.9 F (36.6 C)  98.2 F (36.8 C)  98.4 F (36.9 C)  97.5 F (36.4 C)   TempSrc:   Oral  Oral  Oral   Resp:  18  18  18  19    Height:       Weight:       SpO2:  100%  100%  100%  100%    General: alert, awake, oriented to self only, pleasant but confused intermittently  Cardiovascular: S1S2/RRR  Respiratory: CTAB  Discharge Instructions  Discharge Orders    Future Orders  Complete By  Expires     Discharge instructions  As directed      Comments:     Dysphagia 1 Diet and thin liquids     Increase activity slowly  As directed          Medication List     STOP taking these medications       losartan-hydrochlorothiazide 100-12.5 MG per tablet    Commonly known as: HYZAAR    metFORMIN 500 MG (MOD) 24 hr tablet    Commonly known as: GLUMETZA     TAKE these medications       aspirin EC 81 MG tablet    Take 81 mg by mouth daily.    atenolol 100 MG tablet    Commonly known as: TENORMIN    Take 0.5 tablets (50 mg total) by mouth daily.    D-3-5 5000 UNITS capsule    Generic drug: Cholecalciferol    Take 5,000 Units by mouth daily.    finasteride 5 MG tablet    Commonly known as: PROSCAR    Take 5 mg by mouth daily.    levETIRAcetam 500 MG tablet    Commonly known as: KEPPRA    Take 500 mg by mouth every 12 (twelve) hours.    pantoprazole 40 MG tablet    Commonly known as: PROTONIX    Take 1 tablet (40 mg total) by mouth daily at 12 noon.    simvastatin 40 MG tablet    Commonly known as: ZOCOR     Take 40 mg by mouth every evening.        The results of significant diagnostics from this hospitalization (including imaging, microbiology, ancillary and laboratory) are listed below for reference.   Significant Diagnostic Studies:  Dg Chest 2 View  09/27/2012 *RADIOLOGY REPORT* Clinical Data: altered mental status CHEST - 2 VIEW Comparison: 08/30/2012 Findings: Lungs remain clear. Negative  for heart failure or pneumonia. No mass lesion. Heart size is normal. IMPRESSION: No acute abnormality. Original Report Authenticated By: Janeece Riggers, M.D.  Ct Soft Tissue Neck Wo Contrast  09/27/2012 *RADIOLOGY REPORT* Clinical Data: Palpable mass right neck. Difficulty swallowing. History of meningioma resection skull base. CT NECK WITHOUT CONTRAST Technique: Multidetector CT imaging of the neck was performed without intravenous contrast. 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 Based on the prior MRI there is residual meningioma present. There is encephalomalacia in the inferior frontal lobes. There is obstruction of the sphenoid sinus which is filled with secretions, similar to prior imaging. This is incompletely evaluated on the study of the neck. Right submandibular gland is enlarged and markedly asymmetric when compared to the left. The right gland measures approximately 2 x 3 cm. Left gland is atrophic measuring approximately 9 x 19 mm. The right submandibular gland is enlarged and there is some stranding in the surrounding fat and thickening of the right platysmus muscle. Small submental nodes are present on the right. There is asymmetry of the floor the mouth on the right. Without intravenous contrast, evaluation is limited. No pathologically enlarged lymph nodes. Thyroid is normal. Lung apices are clear. Parotid gland is normal bilaterally. Cervical disc degeneration and spondylosis without acute bony abnormality. There  is thickening and calcification of the transverse ligament of C1. This is causing spinal stenosis. IMPRESSION: Enlargement of the right submandibular gland with some stranding in the surrounding fat. There is asymmetric soft tissue thickening of the floor the mouth on the right. The patient's white blood count is elevated today and this may be due to acute infection. Lack of intravenous contrast makes it difficult to evaluate for abscess. Neoplasm in the submandibular and also possible. Given the elevated white count, treatment for infection is suggested. If the palpable mass persists after treatment repeat CT neck with contrast is suggested to exclude a mass lesion. Postop changes of meningioma resection with chronic sinusitis. Original Report Authenticated By: Janeece Riggers, M.D.  US Renal  09/27/2012 *RADIOLOGY REPORT* Clinical Data: Mass RENAL/URINARY TRACT ULTRASOUND COMPLETE Comparison: Renal ultrasound 05/09/2009 Findings: Right Kidney: 11.8 cm. Limited visualization of the right kidney due to overlying bowel gas. Increased echogenicity of the cortex. Right upper pole cyst 2.5 x 3.0 cm. Cyst in the left lower pole containing internal septations measuring 4.5 x 6.7 cm. This is probably benign. Negative for obstruction. Left Kidney: 11.1 cm in length. 3 x 4 cm left upper pole cyst. 3 x 3 cm left upper pole cyst. Bladder: Empty bladder appear IMPRESSION: Bilateral renal cysts. No obstruction. Original Report Authenticated By: Janeece Riggers, M.D.  Dg Swallowing Func-speech Pathology  09/30/2012 [name], CCC-SLP 09/30/2012 12:50 PM Objective Swallowing Evaluation: Modified Barium Swallowing Study Patient Details Name: Kurt [name] MRN: [id number] Date of Birth: 16-Jul-1928 Today's Date: 09/30/2012 Time: 1110-1135 SLP Time Calculation (min): 25 min Past Medical History: Past Medical History Diagnosis Date . Hypertension . Hyperlipidemia . Seizure disorder . CKD (chronic kidney disease), stage III . Altered  mental state 08/31/2012 "came into hospital a little disoriented; it's gotten worse; this is not normal" (08/31/2012) . Seizures . Type II diabetes mellitus . Chronic lower back pain Past Surgical History: Past Surgical History Procedure Laterality Date . Brain tumor excision . Brain meningioma excision 08/2000 HPI: Jelani Trueba is an 79 y.o. male with acute renal failure and right neck swelling. Pt c/o difficulty swallowing for past week. CT  neck without contrast showed hypotrophic left 9mm submandibular gland and right homogeneous 2x3 cm submandibular gland without obvious mass or abscess and surrounding soft tissue stranding likely submandibular sialoadenitis. Pt started on antibiotics for treatment of mass. Bedside swallow assessment recommended MBS. Assessment / Plan / Recommendation Clinical Impression Dysphagia Diagnosis: Severe oral phase dysphagia;Moderate pharyngeal phase dysphagia;Severe pharyngeal phase dysphagia Clinical impression: Pt. demonstrated severe oral dysphagia with poor ability to form bolus, maintain cohesion leading to residue throughout oral cavity and transit delays. Pharyngeal phase includes moderate-severe impairments both sensory and motor tracts affected. Delayed swallow initiation, reduced pharyngeal contraction. decreased laryngeal elevation and epiglottic closure led to moderate-severe vallecular and moderate pyriform sinus residue throughout study. Frank aspiration of thin before and during swallow (basically silent with one minimal throat clear). Pt. does not sense majority of residue and was unable to effectively swallow additional times with therapeutic intervention via verbal/visual cues (several times a reflexive 2nd swallow performed and cleared mild amount). Unfortunately at this time he aspiration does not appear preventable with any diet/liquid modifications. Pt. confused with dementia and unable to perform compensatory techniques. Dys 1 and nectar thick may have the least  potential to penetrate/aspirate, however not guaranteed. Discussed with Dr. Jomarie Longs and explained clinical findings and results of swallow assessments with precautions to pt.'s family (mom has dementia, 2 daughters present). ST will continue to follow for education. Treatment Recommendation Therapy as outlined in treatment plan below Diet Recommendation Dysphagia 1 (Puree);Nectar-thick liquid Liquid Administration via: Cup;No straw Medication Administration: Crushed with puree Supervision: Full supervision/cueing for compensatory strategies Compensations: Slow rate;Small sips/bites;Check for pocketing;Multiple dry swallows after each bite/sip Postural Changes and/or Swallow Maneuvers: Seated upright 90 degrees Other Recommendations Oral Care Recommendations: Oral care QID Follow Up Recommendations (to be determined) Frequency and Duration min 2x/week 2 weeks Pertinent Vitals/Pain SLP Swallow Goals Patient will utilize recommended strategies during swallow to increase swallowing safety with: Maximal cueing Goal #3: Pt will participate in therapeutic po trials with increased secretion management (coughing, throat clear) with mod verbal cues Swallow Study Goal #3 - Progress: Met Goal #4: Family will verbalize understanding of swallow precautions with min cues. Reason for Referral Objectively evaluate swallowing function Oral Phase Oral Preparation/Oral Phase Oral Phase: Impaired Oral - Honey Oral - Honey Teaspoon: Weak lingual manipulation;Right pocketing in lateral sulci;Left pocketing in lateral sulci;Delayed oral transit;Reduced posterior propulsion;Lingual/palatal residue Oral - Honey Cup: Weak lingual manipulation;Right pocketing in lateral sulci;Left pocketing in lateral sulci;Delayed oral transit;Reduced posterior propulsion;Lingual/palatal residue (sublingual residue) Oral - Nectar Oral - Nectar Teaspoon: Weak lingual manipulation;Right pocketing in lateral sulci;Left pocketing in lateral sulci;Delayed oral  transit;Reduced posterior propulsion;Lingual/palatal residue Oral - Nectar Cup: Weak lingual manipulation;Right pocketing in lateral sulci;Left pocketing in lateral sulci;Delayed oral transit;Reduced posterior propulsion;Lingual/palatal residue Oral - Thin Oral - Thin Teaspoon: Weak lingual manipulation;Right pocketing in lateral sulci;Left pocketing in lateral sulci;Delayed oral transit;Reduced posterior propulsion;Lingual/palatal residue Oral - Thin Cup: Weak lingual manipulation;Right pocketing in lateral sulci;Left pocketing in lateral sulci;Delayed oral transit;Reduced posterior propulsion;Lingual/palatal residue Oral - Solids Oral - Puree: Weak lingual manipulation;Right pocketing in lateral sulci;Left pocketing in lateral sulci;Delayed oral transit;Reduced posterior propulsion;Lingual/palatal residue Pharyngeal Phase Pharyngeal Phase Pharyngeal Phase: Impaired Pharyngeal - Honey Pharyngeal - Honey Teaspoon: Delayed swallow initiation;Premature spillage to valleculae;Premature spillage to pyriform sinuses;Pharyngeal residue - valleculae;Pharyngeal residue - pyriform sinuses;Reduced tongue base retraction;Reduced pharyngeal peristalsis;Reduced laryngeal elevation Pharyngeal - Honey Cup: Delayed swallow initiation;Premature spillage to valleculae;Premature spillage to pyriform sinuses;Pharyngeal residue - valleculae;Pharyngeal residue - pyriform sinuses;Reduced tongue base retraction;Reduced pharyngeal peristalsis;Reduced  laryngeal elevation Pharyngeal - Nectar Pharyngeal - Nectar Teaspoon: Delayed swallow initiation;Premature spillage to valleculae;Premature spillage to pyriform sinuses;Pharyngeal residue - valleculae;Pharyngeal residue - pyriform sinuses;Reduced tongue base retraction;Reduced pharyngeal peristalsis;Reduced laryngeal elevation Pharyngeal - Nectar Cup: Delayed swallow initiation;Premature spillage to valleculae;Premature spillage to pyriform sinuses;Pharyngeal residue - valleculae;Pharyngeal  residue - pyriform sinuses;Reduced tongue base retraction;Reduced pharyngeal peristalsis;Reduced laryngeal elevation;Reduced anterior laryngeal mobility;Reduced airway/laryngeal closure;Penetration/Aspiration during swallow Penetration/Aspiration details (nectar cup): Material enters airway, remains ABOVE vocal cords and not ejected out Pharyngeal - Thin Pharyngeal - Thin Teaspoon: Delayed swallow initiation;Premature spillage to valleculae;Premature spillage to pyriform sinuses;Pharyngeal residue - valleculae;Pharyngeal residue - pyriform sinuses;Reduced tongue base retraction;Reduced pharyngeal peristalsis;Reduced laryngeal elevation;Penetration/Aspiration during swallow;Reduced anterior laryngeal mobility;Reduced airway/laryngeal closure Penetration/Aspiration details (thin teaspoon): Material enters airway, remains ABOVE vocal cords and not ejected out Pharyngeal - Thin Cup: Delayed swallow initiation;Premature spillage to valleculae;Premature spillage to pyriform sinuses;Pharyngeal residue - valleculae;Pharyngeal residue - pyriform sinuses;Reduced tongue base retraction;Reduced pharyngeal peristalsis;Reduced laryngeal elevation;Penetration/Aspiration during swallow;Reduced anterior laryngeal mobility;Reduced airway/laryngeal closure;Penetration/Aspiration before swallow;Significant aspiration (Amount) Penetration/Aspiration details (thin cup): Material enters airway, passes BELOW cords without attempt by patient to eject out (silent aspiration) (slight throat clear) Pharyngeal - Solids Pharyngeal - Puree: Delayed swallow initiation;Pharyngeal residue - valleculae;Pharyngeal residue - pyriform sinuses;Reduced tongue base retraction;Reduced pharyngeal peristalsis;Reduced laryngeal elevation;Premature spillage to valleculae Cervical Esophageal Phase GO Cervical Esophageal Phase Cervical Esophageal Phase: [facility] Buss[employer].Ed CCC-SLP Pager [phone] 09/30/2012  Microbiology:  Recent Results (from the past  240 hour(s))   CULTURE, BLOOD (ROUTINE X 2) Status: None    Collection Time    09/27/12 8:45 PM   Result  Value  Range  Status    Specimen Description  BLOOD LEFT ARM   Final    Special Requests  BOTTLES DRAWN AEROBIC AND ANAEROBIC 10CC EACH   Final    Culture Setup Time  09/28/2012 02:16   Final    Culture  NO GROWTH 5 DAYS   Final    Report Status  10/04/2012 FINAL   Final   CULTURE, BLOOD (ROUTINE X 2) Status: None    Collection Time    09/27/12 8:55 PM   Result  Value  Range  Status    Specimen Description  BLOOD RIGHT HAND   Final    Special Requests  BOTTLES DRAWN AEROBIC ONLY 5CC   Final    Culture Setup Time  09/28/2012 02:16   Final    Culture  NO GROWTH 5 DAYS   Final    Report Status  10/04/2012 FINAL   Final   MRSA PCR SCREENING Status: Abnormal    Collection Time    09/28/12 11:34 AM   Result  Value  Range  Status    MRSA by PCR  POSITIVE (*)  NEGATIVE  Final    Comment:      The GeneXpert MRSA Assay (FDA     approved for NASAL specimens     only), is one component of a     comprehensive MRSA colonization     surveillance program. It is not     intended to diagnose MRSA     infection nor to guide or     monitor treatment for     MRSA infections.     RESULT CALLED TO, READ BACK BY AND VERIFIED WITH:     MATTHEWS,H RN @ 1349 ON 09/28/12 BY LEONARD,A    Labs:  Basic Metabolic Panel:   Recent Labs  Lab  09/29/12 0445  09/30/12 0510  10/01/12 0630  10/02/12 0510  10/03/12 0516  10/04/12 0810  10/05/12 0625   NA  158*  163*  < >  163*  163*  160*  155*  150*   K  3.5  3.2*  < >  3.7  3.4*  3.8  3.7  3.4*   CL  122*  126*  < >  129*  127*  127*  122*  117*   CO2  26  28  < >  27  28  26  27  26    GLUCOSE  233*  175*  < >  120*  102*  132*  104*  81   BUN  153*  126*  < >  93*  70*  53*  39*  35*   CREATININE  4.82*  3.76*  < >  2.88*  2.35*  2.18*  2.02*  2.10*   CALCIUM  9.9  10.4  < >  10.2  10.5  10.1  10.4  10.1   PHOS  3.9  3.9  --  3.2  3.3  2.7   --  --   < > = values in this interval not displayed.  Liver Function Tests:   Recent Labs  Lab  09/29/12 0445  09/30/12 0510  10/01/12 0630  10/02/12 0510  10/03/12 0516   AST  10  --  --  --  --   ALT  6  --  --  --  --   ALKPHOS  43  --  --  --  --   BILITOT  0.3  --  --  --  --   PROT  6.8  --  --  --  --   ALBUMIN  2.8*  2.8*  2.7*  2.7*  2.6*    No results found for this basename: LIPASE, AMYLASE, in the last 168 hours  No results found for this basename: AMMONIA, in the last 168 hours  CBC:   Recent Labs  Lab  09/30/12 0510  10/01/12 0630  10/02/12 0510  10/03/12 0516  10/04/12 0810   WBC  10.3  6.7  5.5  5.5  7.6   HGB  10.2*  10.1*  10.6*  10.2*  10.7*   HCT  29.0*  29.5*  31.2*  30.2*  32.2*   MCV  86.3  88.3  88.9  89.3  89.7   PLT  101*  101*  98*  95*  91*    Cardiac Enzymes:  No results found for this basename: CKTOTAL, CKMB, CKMBINDEX, TROPONINI, in the last 168 hours  BNP:  BNP (last 3 results)  No results found for this basename: PROBNP, in the last 8760 hours  CBG:   Recent Labs  Lab  10/04/12 1835  10/04/12 2126  10/05/12 1151  10/05/12 1714  10/05/12 2231   GLUCAP  79  93  141*  69*  196*

## 2012-10-07 NOTE — Progress Notes (Signed)
Patient discharged to [facility] facility. Patient AVS and swallow precautions sent with patient to facility. Called report to [place], RN, reviewed all swallow precautions with her as well.  Patient remains stable; no signs or symptoms of distress.  Patient transported via EMS.

## 2012-10-07 NOTE — Progress Notes (Signed)
Subjective: Admitted for acute renal failure that has improved with hydration, ENT was consulted for right submandibular sialoadenitis/swelling. This has markedly improved with IV then PO antibiotics and steroids  Objective: Vital signs in last 24 hours: Temp:  [97.4 F (36.3 C)-98.5 F (36.9 C)] 98.5 F (36.9 C) (04/30 2217) Pulse Rate:  [64-72] 64 (04/30 2217) Resp:  [16-18] 18 (04/30 2217) BP: (109-140)/(57-72) 109/57 mmHg (04/30 2217) SpO2:  [96 %-99 %] 96 % (04/30 2217)  CN 2-12 intact and symmetric, facial nerve [place]ackmann 1/6 bilaterally in all divisions. Neck supple, right submandibular swelling has improved markedly and essentially resolved, patient feels this is subjectively resolved. Trachea midline. EOMI, PERRLA  @LABLAST2 (wbc:2,hgb:2,hct:2,plt:2)  Recent Labs  10/05/12 0625 10/06/12 0730  NA 150* 144  K 3.4* 3.5  CL 117* 112  CO2 26 26  GLUCOSE 81 98  BUN 35* 32*  CREATININE 2.10* 2.15*  CALCIUM 10.1 9.5    Medications: Current facility-administered medications:acetaminophen (TYLENOL) suppository 650 mg, 650 mg, Rectal, Q6H PRN, Eduard Clos, MD;  acetaminophen (TYLENOL) tablet 650 mg, 650 mg, Oral, Q6H PRN, Eduard Clos, MD;  atenolol (TENORMIN) tablet 50 mg, 50 mg, Oral, Daily, Zannie Cove, MD, 50 mg at 10/06/12 1000;  dextrose 5 % solution, , Intravenous, Continuous, Trevor Iha, MD, Last Rate: 75 mL/hr at 10/05/12 1330 feeding supplement (ENSURE) pudding 1 Container, 1 Container, Oral, TID BM, Haynes Bast, RD, 1 Container at 10/06/12 1000;  feeding supplement (RESOURCE BREEZE) liquid 1 Container, 1 Container, Oral, TID BM, Haynes Bast, RD, 1 Container at 10/06/12 2149;  finasteride (PROSCAR) tablet 5 mg, 5 mg, Oral, Daily, Zannie Cove, MD, 5 mg at 10/06/12 1207;  food thickener (THICK IT) powder, , Oral, PRN, Zannie Cove, MD hydrALAZINE (APRESOLINE) injection 10 mg, 10 mg, Intravenous, Q4H PRN, Eduard Clos, MD,  10 mg at 10/04/12 9629;  insulin aspart (novoLOG) injection 0-9 Units, 0-9 Units, Subcutaneous, TID WC, Zannie Cove, MD, 1 Units at 10/05/12 1206;  levETIRAcetam (KEPPRA) tablet 500 mg, 500 mg, Oral, BID, Zannie Cove, MD, 500 mg at 10/06/12 2222 multivitamin with minerals tablet 1 tablet, 1 tablet, Oral, Daily, Haynes Bast, RD, 1 tablet at 10/06/12 1207;  ondansetron (ZOFRAN) injection 4 mg, 4 mg, Intravenous, Q6H PRN, Eduard Clos, MD;  pantoprazole (PROTONIX) EC tablet 40 mg, 40 mg, Oral, Q1200, Zannie Cove, MD, 40 mg at 10/06/12 1758;  sodium chloride 0.9 % injection 3 mL, 3 mL, Intravenous, Q12H, Eduard Clos, MD, 3 mL at 10/05/12 0051  Assessment/Plan: Markedly improved/normalized right submandibular sialoadenitis, likely from dehydration. This appears resolved, can finish antibiotics, no need for outpatient follow up unless submandibular swelling returns, can cancel US-guided FNA unless swelling or mass returns since this has essentially resolved with antibiotic treatment. Should hydrate and use sialogogues (lemon wedges, etc.). F/U as needed.   LOS: 10 days   Melvenia Beam 10/07/2012, 6:59 AM

## 2012-10-07 NOTE — Progress Notes (Signed)
Physical Therapy Treatment Patient Details Name: Kurt [name] MRN: [id number] DOB: 1928-08-11 Today's Date: 10/07/2012 Time: 1478-2956 PT Time Calculation (min): 14 min  PT Assessment / Plan / Recommendation Comments on Treatment Session       Follow Up Recommendations  SNF;Supervision/Assistance - 24 hour     Does the patient have the potential to tolerate intense rehabilitation     Barriers to Discharge        Equipment Recommendations  None recommended by PT    Recommendations for Other Services    Frequency Min 3X/week   Plan Discharge plan remains appropriate;Frequency remains appropriate    Precautions / Restrictions Precautions Precautions: Fall Precaution Comments: history of falling.   Restrictions Weight Bearing Restrictions: No   Pertinent Vitals/Pain No pain    Mobility  Bed Mobility Bed Mobility: Supine to Sit;Sit to Supine Supine to Sit: 4: Min guard;HOB elevated Sitting - Scoot to Edge of Bed: 4: Min guard Sit to Supine: 4: Min guard Details for Bed Mobility Assistance: no physical assistance required.  Transfers Transfers: Sit to Stand;Stand to Sit Sit to Stand: 3: Mod assist;From bed;With upper extremity assist Stand to Sit: 4: Min assist;With upper extremity assist;To bed Details for Transfer Assistance: Pt needed verbal and tacticle cues to stand from EOB.  Once standing needed VCs to stand tall and grab rw.  Pt needed cueing to keep rw with him while transferring Ambulation/Gait Ambulation/Gait Assistance: 4: Min assist Ambulation Distance (Feet): 200 Feet Assistive device: Rolling walker Ambulation/Gait Assistance Details: Assist to mange RW. Assist for balance initially.   Gait Pattern: Step-to pattern;Decreased stride length;Decreased hip/knee flexion - right;Decreased hip/knee flexion - left;Trunk flexed Gait velocity: quite slow Stairs: No Wheelchair Mobility Wheelchair Mobility: No    Exercises     PT Diagnosis:    PT Problem  List:   PT Treatment Interventions:     PT Goals Acute Rehab PT Goals PT Goal Formulation: Patient unable to participate in goal setting Time For Goal Achievement: 10/14/12 Potential to Achieve Goals: Fair Pt will go Supine/Side to Sit: with modified independence PT Goal: Supine/Side to Sit - Progress: Progressing toward goal Pt will go Sit to Supine/Side: with modified independence;with HOB 0 degrees PT Goal: Sit to Supine/Side - Progress: Progressing toward goal Pt will go Sit to Stand: with supervision PT Goal: Sit to Stand - Progress: Progressing toward goal Pt will go Stand to Sit: with supervision PT Goal: Stand to Sit - Progress: Progressing toward goal Pt will Transfer Bed to Chair/Chair to Bed: with supervision PT Transfer Goal: Bed to Chair/Chair to Bed - Progress: Progressing toward goal Pt will Ambulate: with rolling walker;>150 feet;with supervision PT Goal: Ambulate - Progress: Progressing toward goal  Visit Information  Last PT Received On: 10/07/12    Subjective Data  Subjective: agree to OOB with PT   Cognition  Cognition Arousal/Alertness: Awake/alert Behavior During Therapy: WFL for tasks assessed/performed Overall Cognitive Status: Impaired/Different from baseline Area of Impairment: Memory;Following commands;Problem solving Orientation Level: Disoriented to;Place;Time;Situation Memory: Decreased short-term memory Following Commands: Follows one step commands with increased time;Follows one step commands inconsistently Safety/Judgement: Decreased awareness of safety;Decreased awareness of deficits Problem Solving: Decreased initiation;Requires verbal cues;Requires tactile cues;Difficulty sequencing;Slow processing    Balance  Balance Balance Assessed: No  End of Session PT - End of Session Equipment Utilized During Treatment: Gait belt Activity Tolerance: Patient tolerated treatment well Patient left: in bed;with call bell/phone within reach;with bed  alarm set Nurse Communication: Mobility status   GP  Lashanta Elbe 10/07/2012, 4:37 PM [name] L. Tonnia Bardin DPT [phone]

## 2012-10-15 ENCOUNTER — Emergency Department (HOSPITAL_COMMUNITY): Payer: Medicare Other

## 2012-10-15 ENCOUNTER — Encounter (HOSPITAL_COMMUNITY): Payer: Self-pay | Admitting: Family Medicine

## 2012-10-15 ENCOUNTER — Emergency Department (HOSPITAL_COMMUNITY)
Admission: EM | Admit: 2012-10-15 | Discharge: 2012-10-15 | Disposition: A | Discharge status: Home / Self Care | Payer: Medicare Other | Attending: Emergency Medicine | Admitting: Emergency Medicine

## 2012-10-15 DIAGNOSIS — W07XXXA Fall from chair, initial encounter: Secondary | ICD-10-CM | POA: Insufficient documentation

## 2012-10-15 DIAGNOSIS — S0990XA Unspecified injury of head, initial encounter: Secondary | ICD-10-CM | POA: Insufficient documentation

## 2012-10-15 DIAGNOSIS — S0191XA Laceration without foreign body of unspecified part of head, initial encounter: Secondary | ICD-10-CM

## 2012-10-15 DIAGNOSIS — E1129 Type 2 diabetes mellitus with other diabetic kidney complication: Secondary | ICD-10-CM | POA: Insufficient documentation

## 2012-10-15 DIAGNOSIS — Z8669 Personal history of other diseases of the nervous system and sense organs: Secondary | ICD-10-CM | POA: Insufficient documentation

## 2012-10-15 DIAGNOSIS — Y9210 Unspecified residential institution as the place of occurrence of the external cause: Secondary | ICD-10-CM | POA: Insufficient documentation

## 2012-10-15 DIAGNOSIS — W19XXXA Unspecified fall, initial encounter: Secondary | ICD-10-CM

## 2012-10-15 DIAGNOSIS — Z79899 Other long term (current) drug therapy: Secondary | ICD-10-CM | POA: Insufficient documentation

## 2012-10-15 DIAGNOSIS — Y939 Activity, unspecified: Secondary | ICD-10-CM | POA: Insufficient documentation

## 2012-10-15 DIAGNOSIS — E785 Hyperlipidemia, unspecified: Secondary | ICD-10-CM | POA: Insufficient documentation

## 2012-10-15 DIAGNOSIS — S0100XA Unspecified open wound of scalp, initial encounter: Secondary | ICD-10-CM | POA: Insufficient documentation

## 2012-10-15 DIAGNOSIS — I129 Hypertensive chronic kidney disease with stage 1 through stage 4 chronic kidney disease, or unspecified chronic kidney disease: Secondary | ICD-10-CM | POA: Insufficient documentation

## 2012-10-15 DIAGNOSIS — Z8739 Personal history of other diseases of the musculoskeletal system and connective tissue: Secondary | ICD-10-CM | POA: Insufficient documentation

## 2012-10-15 DIAGNOSIS — N183 Chronic kidney disease, stage 3 unspecified: Secondary | ICD-10-CM | POA: Insufficient documentation

## 2012-10-15 DIAGNOSIS — Z7982 Long term (current) use of aspirin: Secondary | ICD-10-CM | POA: Insufficient documentation

## 2012-10-15 NOTE — ED Notes (Signed)
[facility] called to pick up pt  

## 2012-10-15 NOTE — ED Notes (Signed)
Patient transported to CT 

## 2012-10-15 NOTE — ED Provider Notes (Signed)
History     CSN: [id number]  Arrival date & time 10/15/12  0721   First MD Initiated Contact with Patient 10/15/12 [phone]      Chief Complaint  Patient presents with  . Fall    (Consider location/radiation/quality/duration/timing/severity/associated sxs/prior treatment) HPI Comments: Patient presents after a fall from a chair that occurred prior to arrival. Patient remembers the event and denies that he lost consciousness. He denies any blood thinning medications and this is confirmed by his medication list. No current headache, blurry vision, vomiting, weakness in arms or legs. No pain elsewhere. No neck pain. Onset of symptoms acute. Course is constant. Nothing makes symptoms better or worse.  Patient is a 79 y.o. male presenting with fall. The history is provided by the patient.  Fall Pertinent negatives include no numbness, no nausea, no vomiting and no headaches.    Past Medical History  Diagnosis Date  . Hypertension   . Hyperlipidemia   . Seizure disorder   . CKD (chronic kidney disease), stage III   . Altered mental state 08/31/2012    "came into hospital a little disoriented; it's gotten worse; this is not normal" (08/31/2012)  . Seizures   . Type II diabetes mellitus   . Chronic lower back pain     Past Surgical History  Procedure Laterality Date  . Brain tumor excision    . 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 Hypertension Daughter     History  Substance Use Topics  . Smoking status: Never Smoker   . Smokeless tobacco: Never Used  . Alcohol Use: No      Review of Systems  Constitutional: Negative for fatigue.  HENT: Negative for neck pain and tinnitus.   Eyes: Negative for photophobia, pain and visual disturbance.  Respiratory: Negative for shortness of breath.   Cardiovascular: Negative for chest pain.  Gastrointestinal: Negative for nausea and vomiting.  Musculoskeletal: Negative for back pain and  gait problem.  Skin: Positive for wound.  Neurological: Negative for dizziness, weakness, light-headedness, numbness and headaches.  Psychiatric/Behavioral: Negative for confusion and decreased concentration.    Allergies  Review of patient's allergies indicates no known allergies.  Home Medications   Current Outpatient Rx  Name  Route  Sig  Dispense  Refill  . aspirin EC 81 MG tablet   Oral   Take 81 mg by mouth daily.         Marland Kitchen atenolol (TENORMIN) 100 MG tablet   Oral   Take 0.5 tablets (50 mg total) by mouth daily.   30 tablet   0   . Cholecalciferol (D-3-5) 5000 UNITS capsule   Oral   Take 5,000 Units by mouth daily.         . finasteride (PROSCAR) 5 MG tablet   Oral   Take 5 mg by mouth daily.         Marland Kitchen levETIRAcetam (KEPPRA) 500 MG tablet   Oral   Take 500 mg by mouth every 12 (twelve) hours.         . pantoprazole (PROTONIX) 40 MG tablet   Oral   Take 1 tablet (40 mg total) by mouth daily at 12 noon.   30 tablet   0   . simvastatin (ZOCOR) 40 MG tablet   Oral   Take 40 mg by mouth every evening.           BP 127/74  Pulse 76  Temp(Src)  98.5 F (36.9 C) (Oral)  Resp 18  SpO2 100%  Physical Exam  Nursing note and vitals reviewed. Constitutional: He is oriented to person, place, and time. He appears well-developed and well-nourished.  HENT:  Head: Normocephalic. Head is without raccoon's eyes and without Battle's sign.  Right Ear: Tympanic membrane, external ear and ear canal normal. No hemotympanum.  Left Ear: Tympanic membrane, external ear and ear canal normal. No hemotympanum.  Nose: Nose normal. No nasal septal hematoma.  Mouth/Throat: Oropharynx is clear and moist.  Eyes: Conjunctivae, EOM and lids are normal. Pupils are equal, round, and reactive to light.  No visible hyphema. Arcus senilis present bilaterally.  Neck: Normal range of motion. Neck supple.  Cardiovascular: Normal rate and regular rhythm.   Pulmonary/Chest: Effort  normal and breath sounds normal.  Abdominal: Soft. There is no tenderness.  Musculoskeletal: Normal range of motion.       Cervical back: He exhibits normal range of motion, no tenderness and no bony tenderness.       Thoracic back: He exhibits no tenderness and no bony tenderness.       Lumbar back: He exhibits no tenderness and no bony tenderness.  Neurological: He is alert and oriented to person, place, and time. He has normal strength and normal reflexes. No cranial nerve deficit or sensory deficit. Coordination normal. GCS eye subscore is 4. GCS verbal subscore is 5. GCS motor subscore is 6.  Skin: Skin is warm and dry.  2, less than 1 cm lacerations, superficial, hemostatic noted to left superior scalp. There is minimal gaping.  Psychiatric: He has a normal mood and affect.    ED Course  Procedures (including critical care time)  Labs Reviewed - No data to display Ct Head Wo Contrast  10/15/2012  *RADIOLOGY REPORT*  Clinical Data: Fall status post meningioma excision  CT HEAD WITHOUT CONTRAST  Technique:  Contiguous axial images were obtained from the base of the skull through the vertex without contrast.  Comparison: 08/30/2012  Findings: No skull fracture is noted.  Again noted prior craniotomy frontal region.  Stable bilateral frontal chronic encephalomalacia.  Again noted bulky meningioma skull base with extension in sphenoid sinuses. Partial opacification of the ethmoid sinuses. No intracranial hemorrhage, mass effect or midline shift.  Ventricular size is stable from prior exam.  No acute infarction.  IMPRESSION: No acute intracranial abnormality.  Stable postsurgical changes. Again noted encephalomalacia bilateral frontal lobes.  Stable bulky meningioma skull base with extension bilateral sphenoid sinuses. Partial opacification of the ethmoid air cells without significant change from prior exam.   Original Report Authenticated By: Natasha Mead, M.D.      1. Laceration of head, initial  encounter   2. Fall, initial encounter     8:09 AM Patient seen and examined. CT ordered. I will be able to dermabond wound.   Vital signs reviewed and are as follows: Filed Vitals:   10/15/12 0738  BP: 127/74  Pulse: 76  Temp: 98.5 F (36.9 C)  Resp: 18   Pt d/w and seen by Dr. Karma Ganja.    9:36 AM CT reviewed by myself. Patient informed of findings.  Wounds cleaned with dermal cleanser and closed using tissue adhesive without complication. Wound is superficial.  LACERATION REPAIR Performed by: Carolee Rota Authorized by: Carolee Rota Consent: Verbal consent obtained. Risks and benefits: risks, benefits and alternatives were discussed Consent given by: patient Patient identity confirmed: provided demographic data Prepped and Draped in normal sterile fashion Wound explored  Laceration  Location: L scalp  Laceration Length: 1cm  No Foreign Bodies seen or palpated  Anesthesia: none  Irrigation method: skin scrub with dermal cleanser  Amount of cleaning: standard  Skin closure: dermabond  Patient tolerance: Patient tolerated the procedure well with no immediate complications.  LACERATION REPAIR Performed by: Carolee Rota Authorized by: Carolee Rota Consent: Verbal consent obtained. Risks and benefits: risks, benefits and alternatives were discussed Consent given by: patient Patient identity confirmed: provided demographic data Prepped and Draped in normal sterile fashion Wound explored  Laceration Location: L scalp  Laceration Length: 1cm  No Foreign Bodies seen or palpated  Anesthesia: none  Irrigation method: skin scrub with dermal cleanser  Amount of cleaning: standard  Skin closure: dermabond  Patient tolerance: Patient tolerated the procedure well with no immediate complications.   Patient was counseled on head injury precautions and symptoms that should indicate their return to the ED.  These include severe worsening headache,  vision changes, confusion, loss of consciousness, trouble walking, nausea & vomiting, or weakness/tingling in extremities.     MDM  Lacerations: clean, superficial, closed easily with Dermabond.   Head injury: CT scan of head with chronic changes in no acute findings. Patient is neurologically intact and appears well in emergency department. Patient does not take Coumadin or Plavix and is very low risk for delayed bleeding. Do not suspect concussion.       Renne Crigler, PA-C 10/15/12 [phone]

## 2012-10-15 NOTE — ED Provider Notes (Signed)
Medical screening examination/treatment/procedure(s) were conducted as a shared visit with non-physician practitioner(s) and myself.  I personally evaluated the patient during the encounter  Pt seen and evaluated.  He has superficial abrasion and 2 small lacs on forehead.  He is awake and alert, no other complaints of pain.  No midline spinal tenderness  Ethelda Chick, MD 10/15/12 [phone]

## 2012-10-15 NOTE — ED Notes (Signed)
Pt from [facility] had falll from chair 1/2 inch lac to forehead with no LOC. BP 118/68. HR 72. CBG 87.

## 2013-02-04 ENCOUNTER — Emergency Department (HOSPITAL_COMMUNITY): Payer: Medicare Other

## 2013-02-04 ENCOUNTER — Emergency Department (HOSPITAL_COMMUNITY)
Admission: EM | Admit: 2013-02-04 | Discharge: 2013-02-04 | Disposition: A | Discharge status: Skilled Nursing Facility | Payer: Medicare Other | Attending: Emergency Medicine | Admitting: Emergency Medicine

## 2013-02-04 ENCOUNTER — Encounter (HOSPITAL_COMMUNITY): Payer: Self-pay

## 2013-02-04 DIAGNOSIS — M545 Low back pain, unspecified: Secondary | ICD-10-CM | POA: Insufficient documentation

## 2013-02-04 DIAGNOSIS — Z79899 Other long term (current) drug therapy: Secondary | ICD-10-CM | POA: Insufficient documentation

## 2013-02-04 DIAGNOSIS — G40909 Epilepsy, unspecified, not intractable, without status epilepticus: Secondary | ICD-10-CM | POA: Insufficient documentation

## 2013-02-04 DIAGNOSIS — E119 Type 2 diabetes mellitus without complications: Secondary | ICD-10-CM | POA: Insufficient documentation

## 2013-02-04 DIAGNOSIS — N183 Chronic kidney disease, stage 3 unspecified: Secondary | ICD-10-CM | POA: Insufficient documentation

## 2013-02-04 DIAGNOSIS — E785 Hyperlipidemia, unspecified: Secondary | ICD-10-CM | POA: Insufficient documentation

## 2013-02-04 DIAGNOSIS — Z7982 Long term (current) use of aspirin: Secondary | ICD-10-CM | POA: Insufficient documentation

## 2013-02-04 DIAGNOSIS — Z043 Encounter for examination and observation following other accident: Secondary | ICD-10-CM | POA: Insufficient documentation

## 2013-02-04 DIAGNOSIS — W19XXXA Unspecified fall, initial encounter: Secondary | ICD-10-CM | POA: Insufficient documentation

## 2013-02-04 DIAGNOSIS — I129 Hypertensive chronic kidney disease with stage 1 through stage 4 chronic kidney disease, or unspecified chronic kidney disease: Secondary | ICD-10-CM | POA: Insufficient documentation

## 2013-02-04 DIAGNOSIS — G8929 Other chronic pain: Secondary | ICD-10-CM | POA: Insufficient documentation

## 2013-02-04 DIAGNOSIS — Y9389 Activity, other specified: Secondary | ICD-10-CM | POA: Insufficient documentation

## 2013-02-04 DIAGNOSIS — Y9210 Unspecified residential institution as the place of occurrence of the external cause: Secondary | ICD-10-CM | POA: Insufficient documentation

## 2013-02-04 LAB — CBC
HCT: 29.4 % — ABNORMAL LOW (ref 39.0–52.0)
MCV: 93.3 fL (ref 78.0–100.0)
RBC: 3.15 MIL/uL — ABNORMAL LOW (ref 4.22–5.81)
RDW: 13.1 % (ref 11.5–15.5)
WBC: 4.5 10*3/uL (ref 4.0–10.5)

## 2013-02-04 LAB — URINALYSIS, ROUTINE W REFLEX MICROSCOPIC
Bilirubin Urine: NEGATIVE
Glucose, UA: NEGATIVE mg/dL
Hgb urine dipstick: NEGATIVE
Ketones, ur: NEGATIVE mg/dL
pH: 5.5 (ref 5.0–8.0)

## 2013-02-04 LAB — BASIC METABOLIC PANEL
BUN: 38 mg/dL — ABNORMAL HIGH (ref 6–23)
CO2: 27 mEq/L (ref 19–32)
Chloride: 104 mEq/L (ref 96–112)
Creatinine, Ser: 1.96 mg/dL — ABNORMAL HIGH (ref 0.50–1.35)

## 2013-02-04 NOTE — ED Notes (Signed)
[facility] called for transport.  

## 2013-02-04 NOTE — ED Notes (Signed)
Bed: WA25 Expected date:  Expected time:  Means of arrival:  Comments: EMS  

## 2013-02-04 NOTE — Progress Notes (Signed)
CSW met with pt and family at bedside.  Pts daughters were able to confirm that pt is a resident at [facility]   His daughters Luisa Dago [phone] and Renaldo Fiddler [phone] share HCPOA  For pt.  Family confirmed that plan is for pt to return to [facility] once he is medically stable.  Marva Panda, Theresia Majors  [phone] .02/04/2013 10:25 pm

## 2013-02-04 NOTE — ED Notes (Signed)
Pt presents from [facility] with c/o fall. Pt fell on carpet, no complaints of pain and EMS denied seeing any injury or deformity.

## 2013-02-04 NOTE — ED Provider Notes (Signed)
CSN: [id number]     Arrival date & time 02/04/13  2015 History   First MD Initiated Contact with Patient 02/04/13 2018     Chief Complaint  Patient presents with  . Fall   (Consider location/radiation/quality/duration/timing/severity/associated sxs/prior Treatment) Patient is a 79 y.o. male presenting with fall. The history is provided by the patient.  Fall This is a recurrent problem. The current episode started less than 1 hour ago. Episode frequency: once. The problem has not changed since onset.Pertinent negatives include no chest pain, no abdominal pain, no headaches and no shortness of breath. Nothing aggravates the symptoms. Nothing relieves the symptoms.    Past Medical History  Diagnosis Date  . Hypertension   . Hyperlipidemia   . Seizure disorder   . CKD (chronic kidney disease), stage III   . Altered mental state 08/31/2012    "came into hospital a little disoriented; it's gotten worse; this is not normal" (08/31/2012)  . Seizures   . Type II diabetes mellitus   . Chronic lower back pain    Past Surgical History  Procedure Laterality Date  . Brain tumor excision    . 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 Hypertension Daughter    History  Substance Use Topics  . Smoking status: Never Smoker   . Smokeless tobacco: Never Used  . Alcohol Use: No    Review of Systems  Unable to perform ROS: Dementia  Respiratory: Negative for shortness of breath.   Cardiovascular: Negative for chest pain.  Gastrointestinal: Negative for abdominal pain.  Neurological: Negative for headaches.    Allergies  Review of patient's allergies indicates no known allergies.  Home Medications   Current Outpatient Rx  Name  Route  Sig  Dispense  Refill  . aspirin EC 81 MG tablet   Oral   Take 81 mg by mouth daily.         Marland Kitchen atenolol (TENORMIN) 50 MG tablet   Oral   Take 50 mg by mouth daily.         . Cholecalciferol (D-3-5)  5000 UNITS capsule   Oral   Take 5,000 Units by mouth daily.         . divalproex (DEPAKOTE SPRINKLE) 125 MG capsule   Oral   Take 250 mg by mouth 2 (two) times daily. 250 mg at 0800 and 250 mg at noon         . ferrous sulfate 325 (65 FE) MG tablet   Oral   Take 325 mg by mouth daily with breakfast.         . finasteride (PROSCAR) 5 MG tablet   Oral   Take 5 mg by mouth daily.         Marland Kitchen levETIRAcetam (KEPPRA) 500 MG tablet   Oral   Take 500 mg by mouth every 12 (twelve) hours.         Marland Kitchen LORazepam (ATIVAN) 1 MG tablet   Oral   Take 1 mg by mouth every 4 (four) hours as needed for anxiety.         . metFORMIN (GLUCOPHAGE-XR) 500 MG 24 hr tablet   Oral   Take 500 mg by mouth 2 (two) times daily.         . pantoprazole (PROTONIX) 40 MG tablet   Oral   Take 1 tablet (40 mg total) by mouth daily at 12 noon.   30 tablet   0   .  simvastatin (ZOCOR) 20 MG tablet   Oral   Take 20 mg by mouth every evening.          BP 154/70  Pulse 66  Temp(Src) 98.3 F (36.8 C) (Oral)  Resp 14  SpO2 99% Physical Exam  Nursing note and vitals reviewed. Constitutional: He appears well-developed and well-nourished. No distress.  HENT:  Head: Normocephalic.  Mouth/Throat: No oropharyngeal exudate.  Healing laceration in central forehead, <0.5 cm in length  Eyes: EOM are normal. Pupils are equal, round, and reactive to light.  Neck: Normal range of motion. Neck supple.  Cardiovascular: Normal rate and regular rhythm.  Exam reveals no friction rub.   No murmur heard. Pulmonary/Chest: Effort normal and breath sounds normal. No respiratory distress. He has no wheezes. He has no rales.  Abdominal: He exhibits no distension. There is no tenderness. There is no rebound.  Musculoskeletal: Normal range of motion. He exhibits no edema.  Neurological: He is alert. No cranial nerve deficit. He exhibits normal muscle tone. Coordination normal.  Skin: He is not diaphoretic.  due to  his is  ED Course  Procedures (including critical care time) Labs Review Labs Reviewed  CBC - Abnormal; Notable for the following:    RBC 3.15 (*)    Hemoglobin 9.9 (*)    HCT 29.4 (*)    Platelets 134 (*)    All other components within normal limits  BASIC METABOLIC PANEL - Abnormal; Notable for the following:    BUN 38 (*)    Creatinine, Ser 1.96 (*)    GFR calc non Af Amer 30 (*)    GFR calc Af Amer 35 (*)    All other components within normal limits  URINALYSIS, ROUTINE W REFLEX MICROSCOPIC  GLUCOSE, CAPILLARY   Imaging Review Ct Head Wo Contrast  02/04/2013   *RADIOLOGY REPORT*  Clinical Data:  Fall with pain.  CT HEAD WITHOUT CONTRAST CT CERVICAL SPINE WITHOUT CONTRAST  Technique:  Multidetector CT imaging of the head and cervical spine was performed following the standard protocol without intravenous contrast.  Multiplanar CT image reconstructions of the cervical spine were also generated.  Comparison:  10/15/2012.  CT HEAD  Findings:  Skull:Status post bifrontal craniotomy for debulking of a central skull base meningioma.  Residual mass, which is partly calcified, better assessed on recent MRI, 08/30/2012. The mass continues to involve the cranial vault and the central skull base.There has been frontal sinus obliteration, with air cells remaining opacified. Bilateral sphenoid sinuses remain completely opacified  and expanded. No acute abnormality, including fracture.  Orbits: No acute abnormality.  Brain: No evidence of acute abnormality, such as acute infarction, hemorrhage, hydrocephalus, or shift.Bifrontal encephalomalacia to above mass.  There is ex vacuo enlargement of the ventricular system, stable from prior.  IMPRESSION: 1.  No evidence of acute intracranial disease. 2.  Anterior skull base meningioma with chronic, complete obstruction of the sphenoid sinuses.  CT CERVICAL SPINE  Findings: No evidence of acute fracture or subluxation.  Large flowing vertebral body osteophytes,  out of proportion to disc space narrowing, resulting in ankylosis of C4-T1.  Multilevel degenerative disc and facet disease additionally.  There is ligamentous thickening ossification in the upper cervical region which narrows the spinal canal.  Facet/uncovertebral spurs result in multilevel osseous neural foraminal stenosis.  IMPRESSION:  1.  Negative for acute fracture or subluxation. 2.  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   Original Report Authenticated By: Tiburcio Pea   Ct  Cervical Spine Wo Contrast  02/04/2013   *RADIOLOGY REPORT*  Clinical Data:  Fall with pain.  CT HEAD WITHOUT CONTRAST CT CERVICAL SPINE WITHOUT CONTRAST  Technique:  Multidetector CT imaging of the head and cervical spine was performed following the standard protocol without intravenous contrast.  Multiplanar CT image reconstructions of the cervical spine were also generated.  Comparison:  10/15/2012.  CT HEAD  Findings:  Skull:Status post bifrontal craniotomy for debulking of a central skull base meningioma.  Residual mass, which is partly calcified, better assessed on recent MRI, 08/30/2012. The mass continues to involve the cranial vault and the central skull base.There has been frontal sinus obliteration, with air cells remaining opacified. Bilateral sphenoid sinuses remain completely opacified  and expanded. No acute abnormality, including fracture.  Orbits: No acute abnormality.  Brain: No evidence of acute abnormality, such as acute infarction, hemorrhage, hydrocephalus, or shift.Bifrontal encephalomalacia to above mass.  There is ex vacuo enlargement of the ventricular system, stable from prior.  IMPRESSION: 1.  No evidence of acute intracranial disease. 2.  Anterior skull base meningioma with chronic, complete obstruction of the sphenoid sinuses.  CT CERVICAL SPINE  Findings: No evidence of acute fracture or subluxation.  Large flowing vertebral body osteophytes, out of proportion to  disc space narrowing, resulting in ankylosis of C4-T1.  Multilevel degenerative disc and facet disease additionally.  There is ligamentous thickening ossification in the upper cervical region which narrows the spinal canal.  Facet/uncovertebral spurs result in multilevel osseous neural foraminal stenosis.  IMPRESSION:  1.  Negative for acute fracture or subluxation. 2.  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   Original Report Authenticated By: Tiburcio Pea     Date: 02/04/2013  Rate: 69  Rhythm: normal sinus rhythm  QRS Axis: normal  Intervals: normal  ST/T Wave abnormalities: normal  Conduction Disutrbances:none  Narrative Interpretation:   Old EKG Reviewed: none available MDM   1. Fall, initial encounter     64M presents with fall from nursing home. Recent fall earlier in the week also. Nursing home concerned about possible infection. Per nursing home, patient has been and increased flight risk of late. His wife is not there because she is in a psychiatric facility. She used to be living with him. He received 2 doses of Ativan earlier today 4 hours apart which could have contributed to his fall. Had recurrent falls of) the family. He also had increased Depakote ordered, however did not receive it. Nursing was concerned about infection. Will screen with labs and head CT. Labs normal. Scans normal. Patient stable for discharge. No infectious source of his agitation noted.      Dagmar Hait, MD 02/04/13 [phone]

## 2013-02-17 ENCOUNTER — Emergency Department (HOSPITAL_COMMUNITY)
Admission: EM | Admit: 2013-02-17 | Discharge: 2013-02-17 | Disposition: A | Discharge status: Home / Self Care | Payer: Medicare Other | Attending: Emergency Medicine | Admitting: Emergency Medicine

## 2013-02-17 ENCOUNTER — Encounter (HOSPITAL_COMMUNITY): Payer: Self-pay

## 2013-02-17 ENCOUNTER — Emergency Department (HOSPITAL_COMMUNITY): Payer: Medicare Other

## 2013-02-17 DIAGNOSIS — Z7982 Long term (current) use of aspirin: Secondary | ICD-10-CM | POA: Insufficient documentation

## 2013-02-17 DIAGNOSIS — Z79899 Other long term (current) drug therapy: Secondary | ICD-10-CM | POA: Insufficient documentation

## 2013-02-17 DIAGNOSIS — Z9181 History of falling: Secondary | ICD-10-CM | POA: Insufficient documentation

## 2013-02-17 DIAGNOSIS — F0390 Unspecified dementia without behavioral disturbance: Secondary | ICD-10-CM | POA: Insufficient documentation

## 2013-02-17 DIAGNOSIS — Y9301 Activity, walking, marching and hiking: Secondary | ICD-10-CM | POA: Insufficient documentation

## 2013-02-17 DIAGNOSIS — W010XXA Fall on same level from slipping, tripping and stumbling without subsequent striking against object, initial encounter: Secondary | ICD-10-CM | POA: Insufficient documentation

## 2013-02-17 DIAGNOSIS — W19XXXA Unspecified fall, initial encounter: Secondary | ICD-10-CM

## 2013-02-17 DIAGNOSIS — Y9210 Unspecified residential institution as the place of occurrence of the external cause: Secondary | ICD-10-CM | POA: Insufficient documentation

## 2013-02-17 DIAGNOSIS — Z043 Encounter for examination and observation following other accident: Secondary | ICD-10-CM | POA: Insufficient documentation

## 2013-02-17 DIAGNOSIS — G40909 Epilepsy, unspecified, not intractable, without status epilepticus: Secondary | ICD-10-CM | POA: Insufficient documentation

## 2013-02-17 DIAGNOSIS — E785 Hyperlipidemia, unspecified: Secondary | ICD-10-CM | POA: Insufficient documentation

## 2013-02-17 DIAGNOSIS — E119 Type 2 diabetes mellitus without complications: Secondary | ICD-10-CM | POA: Insufficient documentation

## 2013-02-17 DIAGNOSIS — I129 Hypertensive chronic kidney disease with stage 1 through stage 4 chronic kidney disease, or unspecified chronic kidney disease: Secondary | ICD-10-CM | POA: Insufficient documentation

## 2013-02-17 DIAGNOSIS — G8929 Other chronic pain: Secondary | ICD-10-CM | POA: Insufficient documentation

## 2013-02-17 DIAGNOSIS — N183 Chronic kidney disease, stage 3 unspecified: Secondary | ICD-10-CM | POA: Insufficient documentation

## 2013-02-17 DIAGNOSIS — M549 Dorsalgia, unspecified: Secondary | ICD-10-CM | POA: Insufficient documentation

## 2013-02-17 HISTORY — DX: Repeated falls: R29.6

## 2013-02-17 LAB — BASIC METABOLIC PANEL
Calcium: 10.2 mg/dL (ref 8.4–10.5)
GFR calc non Af Amer: 31 mL/min — ABNORMAL LOW (ref >90)
Glucose, Bld: 99 mg/dL (ref 70–99)
Sodium: 140 mEq/L (ref 135–145)

## 2013-02-17 LAB — URINALYSIS W MICROSCOPIC + REFLEX CULTURE
Bilirubin Urine: NEGATIVE
Ketones, ur: NEGATIVE mg/dL
Nitrite: NEGATIVE
Urobilinogen, UA: 0.2 mg/dL (ref 0.0–1.0)

## 2013-02-17 LAB — CBC WITH DIFFERENTIAL/PLATELET
Basophils Absolute: 0 10*3/uL (ref 0.0–0.1)
Basophils Relative: 0 % (ref 0–1)
Eosinophils Absolute: 0 10*3/uL (ref 0.0–0.7)
Eosinophils Relative: 1 % (ref 0–5)
MCH: 31.1 pg (ref 26.0–34.0)
MCHC: 33.7 g/dL (ref 30.0–36.0)
MCV: 92 fL (ref 78.0–100.0)
Platelets: 135 10*3/uL — ABNORMAL LOW (ref 150–400)
RDW: 13 % (ref 11.5–15.5)

## 2013-02-17 LAB — VALPROIC ACID LEVEL: Valproic Acid Lvl: 50.4 ug/mL (ref 50.0–100.0)

## 2013-02-17 NOTE — ED Notes (Signed)
[facility] called for transportation to [facility]

## 2013-02-17 NOTE — Progress Notes (Signed)
CSW met with pt at bedside.  Pts daughter confirmed that pt is a current resident of [facility] and the plan is for him to return once he is medically stable.  Patients daughter, Renaldo Fiddler, reports that she and her sister, Luisa Dago share HCPOA.  Family thanked CSW for support and concern.  Marva Panda, Theresia Majors  [phone] .02/17/2013  7:10 pm

## 2013-02-17 NOTE — ED Notes (Signed)
Bed: WA02 Expected date:  Expected time:  Means of arrival:  Comments: EMS-fall-buttock pain

## 2013-02-17 NOTE — ED Provider Notes (Signed)
CSN: [id number]     Arrival date & time 02/17/13  1328 History   First MD Initiated Contact with Patient 02/17/13 1514     Chief Complaint  Patient presents with  . Fall    The history is provided by the EMS personnel, the nursing home and the patient. The history is limited by the condition of the patient (Hx dementia).  Pt was seen at 1515. Per EMS, NH report and pt, pt with frequent falls over the past 3 weeks. Last fall this morning. NH staff states pt has hx dementia and often forgets to use his walker when he tries to walk, thus resulting in falls. NH staff states they sent pt to the ED for evaluation of his frequent falls. Pt himself has hx of dementia, has no complaints at this time. Denies CP/SOB, no abd pain, no neck or back pain.    Past Medical History  Diagnosis Date  . Hypertension   . Hyperlipidemia   . Seizure disorder   . CKD (chronic kidney disease), stage III   . Altered mental state 08/31/2012    "came into hospital a little disoriented; it's gotten worse; this is not normal" (08/31/2012)  . Seizures   . Type II diabetes mellitus   . Chronic lower back pain   . Frequent falls    Past Surgical History  Procedure Laterality Date  . Brain tumor excision    . 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 Hypertension Daughter    History  Substance Use Topics  . Smoking status: Never Smoker   . Smokeless tobacco: Never Used  . Alcohol Use: No    Review of Systems  Unable to perform ROS: Dementia    Allergies  Review of patient's allergies indicates no known allergies.  Home Medications   Current Outpatient Rx  Name  Route  Sig  Dispense  Refill  . aspirin EC 81 MG tablet   Oral   Take 81 mg by mouth daily.         Marland Kitchen atenolol (TENORMIN) 50 MG tablet   Oral   Take 50 mg by mouth daily.         . cholecalciferol (VITAMIN D) 400 UNITS TABS tablet   Oral   Take 400 Units by mouth daily.         .  divalproex (DEPAKOTE SPRINKLE) 125 MG capsule   Oral   Take 250 mg by mouth 4 (four) times daily.         . ferrous sulfate 325 (65 FE) MG tablet   Oral   Take 325 mg by mouth daily with breakfast.         . finasteride (PROSCAR) 5 MG tablet   Oral   Take 5 mg by mouth daily.         Marland Kitchen levETIRAcetam (KEPPRA) 500 MG tablet   Oral   Take 500 mg by mouth every 12 (twelve) hours.         Marland Kitchen LORazepam (ATIVAN) 1 MG tablet   Oral   Take 1 mg by mouth every 4 (four) hours as needed for anxiety.         . metFORMIN (GLUCOPHAGE-XR) 500 MG 24 hr tablet   Oral   Take 500 mg by mouth 2 (two) times daily.         . Multiple Vitamin (MULTIVITAMIN WITH MINERALS) TABS tablet   Oral   Take  1 tablet by mouth daily.         . pantoprazole (PROTONIX) 40 MG tablet   Oral   Take 40 mg by mouth daily.         . simvastatin (ZOCOR) 20 MG tablet   Oral   Take 20 mg by mouth every evening.          BP 148/61  Pulse 57  Temp(Src) 97.7 F (36.5 C) (Oral)  Resp 18  Ht 6\' 3"  (1.905 m)  SpO2 100% Physical Exam 1520: Physical examination:  Nursing notes reviewed; Vital signs and O2 SAT reviewed;  Constitutional: Well developed, Well nourished, Well hydrated, In no acute distress; Head:  Normocephalic, atraumatic; Eyes: EOMI, PERRL, No scleral icterus; ENMT: Mouth and pharynx normal, Mucous membranes moist; Neck: Supple, Full range of motion, No lymphadenopathy; Cardiovascular: Regular rate and rhythm, No gallop; Respiratory: Breath sounds clear & equal bilaterally, No rales, rhonchi, wheezes.  Speaking full sentences with ease, Normal respiratory effort/excursion; Chest: Nontender, Movement normal; Abdomen: Soft, Nontender, Nondistended, Normal bowel sounds; Genitourinary: No CVA tenderness; Spine:  No midline CS, TS, LS tenderness.;; Extremities: Pulses normal, No tenderness, No edema, No calf edema or asymmetry.; Neuro: Awake, alert, confused re: time, place, events per hx dementia.  Major CN grossly intact. No facial droop. Speech clear. Grips equal. No gross focal motor or sensory deficits in extremities.; Skin: Color normal, Warm, Dry.    ED Course  Procedures    MDM  MDM Reviewed: previous chart, nursing note and vitals Reviewed previous: x-ray and labs Interpretation: x-ray, labs and CT scan   Results for orders placed during the hospital encounter of 02/17/13  URINALYSIS W MICROSCOPIC + REFLEX CULTURE      Result Value Range   Color, Urine YELLOW  YELLOW   APPearance CLEAR  CLEAR   Specific Gravity, Urine 1.024  1.005 - 1.030   pH 5.0  5.0 - 8.0   Glucose, UA NEGATIVE  NEGATIVE mg/dL   Hgb urine dipstick NEGATIVE  NEGATIVE   Bilirubin Urine NEGATIVE  NEGATIVE   Ketones, ur NEGATIVE  NEGATIVE mg/dL   Protein, ur 30 (*) NEGATIVE mg/dL   Urobilinogen, UA 0.2  0.0 - 1.0 mg/dL   Nitrite NEGATIVE  NEGATIVE   Leukocytes, UA NEGATIVE  NEGATIVE   Bacteria, UA FEW (*) RARE  CBC WITH DIFFERENTIAL      Result Value Range   WBC 4.5  4.0 - 10.5 K/uL   RBC 3.51 (*) 4.22 - 5.81 MIL/uL   Hemoglobin 10.9 (*) 13.0 - 17.0 g/dL   HCT 29.5 (*) 28.4 - 13.2 %   MCV 92.0  78.0 - 100.0 fL   MCH 31.1  26.0 - 34.0 pg   MCHC 33.7  30.0 - 36.0 g/dL   RDW 44.0  10.2 - 72.5 %   Platelets 135 (*) 150 - 400 K/uL   Neutrophils Relative % 45  43 - 77 %   Neutro Abs 2.0  1.7 - 7.7 K/uL   Lymphocytes Relative 46  12 - 46 %   Lymphs Abs 2.0  0.7 - 4.0 K/uL   Monocytes Relative 8  3 - 12 %   Monocytes Absolute 0.4  0.1 - 1.0 K/uL   Eosinophils Relative 1  0 - 5 %   Eosinophils Absolute 0.0  0.0 - 0.7 K/uL   Basophils Relative 0  0 - 1 %   Basophils Absolute 0.0  0.0 - 0.1 K/uL  BASIC METABOLIC PANEL  Result Value Range   Sodium 140  135 - 145 mEq/L   Potassium 4.2  3.5 - 5.1 mEq/L   Chloride 107  96 - 112 mEq/L   CO2 27  19 - 32 mEq/L   Glucose, Bld 99  70 - 99 mg/dL   BUN 39 (*) 6 - 23 mg/dL   Creatinine, Ser 7.82 (*) 0.50 - 1.35 mg/dL   Calcium 95.6  8.4 - 21.3  mg/dL   GFR calc non Af Amer 31 (*) >90 mL/min   GFR calc Af Amer 36 (*) >90 mL/min  VALPROIC ACID LEVEL      Result Value Range   Valproic Acid Lvl 50.4  50.0 - 100.0 ug/mL   Dg Chest 2 View 02/17/2013   *RADIOLOGY REPORT*  Clinical Data: Recent falls, low back pain, diabetes  CHEST - 2 VIEW  Comparison: Chest x-ray of 09/27/2012  Findings: No active infiltrate or effusion is seen.  Mediastinal contours appear stable in mild cardiomegaly is stable.  No acute bony abnormality is seen with mild degenerative change throughout the thoracic spine.  IMPRESSION: Stable chest x-ray.  No active lung disease.  Stable mild cardiomegaly.   Original Report Authenticated By: Dwyane Dee, M.D.   Dg Lumbar Spine Complete 02/17/2013   CLINICAL DATA:  Frequent falls, low back pain.  EXAM: LUMBAR SPINE - COMPLETE 4+ VIEW  COMPARISON:  None.  FINDINGS: Diffuse degenerative spurring with large anterior and lateral osteophytes. Normal alignment. No fracture. Mild degenerative facet disease.  There appears to fusion across the SI joints bilaterally. Degenerative changes in the hips bilaterally.  Calcification projects medial to the right kidney and could represent a small renal or renal pelvic stone.  IMPRESSION: Degenerative changes. No fracture or malalignment.   Electronically Signed   By: Charlett Nose M.D.   On: 02/17/2013 16:16   Ct Head Wo Contrast 02/17/2013   CLINICAL DATA:  Frequent falls.  EXAM: CT HEAD WITHOUT CONTRAST  TECHNIQUE: Contiguous axial images were obtained from the base of the skull through the vertex without intravenous contrast.  COMPARISON:  Head CT 02/04/2013  FINDINGS: No intracranial hemorrhage. No parenchymal contusion. No midline shift or mass effect. Basilar cisterns are patent. No skull base fracture. No fluid in the paranasal sinuses or mastoid air cells.  There is a large field of encephalomalacia within the left and right frontal lobes. Evidence of prior frontal craniotomies. There is  partially calcified soft tissue within the central skullbase and extending into the sphenoid sinuses. Skull base mass is described as meningioma comparison MRI brain 08/30/2012  No fluid in the mastoid air cells. No evidence of acute fracture  IMPRESSION: 1. No evidence of acute intracranial trauma.  2. Stable bifrontal craniotomies with residual skullbase mass (meningioma) extending into the sphenoid sinuses.   Electronically Signed   By: Genevive Bi M.D.   On: 02/17/2013 16:43    Results for SHERILL, WEGENER (MRN [id number]) as of 02/17/2013 19:30  Ref. Range 10/05/2012 06:25 10/06/2012 07:30 10/07/2012 08:10 02/04/2013 21:50 02/17/2013 15:40  BUN Latest Range: 6-23 mg/dL 35 (H) 32 (H) 27 (H) 38 (H) 39 (H)  Creatinine Latest Range: 0.50-1.35 mg/dL 6.29 (H) 5.28 (H) 4.13 (H) 1.96 (H) 1.92 (H)   Results for TERRI, MALERBA (MRN [id number]) as of 02/17/2013 19:30  Ref. Range 10/02/2012 05:10 10/03/2012 05:16 10/04/2012 08:10 02/04/2013 21:50 02/17/2013 15:40  Hemoglobin Latest Range: 13.0-17.0 g/dL 53.6 (L) 64.4 (L) 03.4 (L) 9.9 (L) 10.9 (L)  HCT Latest Range: 39.0-52.0 %  31.2 (L) 30.2 (L) 32.2 (L) 29.4 (L) 32.3 (L)      1915:  Pt has ambulated with a walker with steady gait, easy resps. Denied any complaints. VS remain stable. H/H, BUN/Cr per baseline. Will d/c pt back to NH.    Laray Anger, DO 02/21/13 [phone]

## 2013-02-17 NOTE — ED Notes (Signed)
Per EMS- Patient is a resident of [facility] in the memory care unit. Staff reports that the patient has been falling frequently x 3 weeks. Patient is suppose to be using a walker, but forgets to do so and thus causing a fall. Staff states the latest fall was this AM. Patient landed on his buttocks.  Patient denies any pain. Staff wanted patient evaluated for the frequent falls.

## 2013-02-17 NOTE — ED Notes (Signed)
Placed in hall E

## 2013-02-22 ENCOUNTER — Inpatient Hospital Stay (HOSPITAL_COMMUNITY): Payer: Medicare Other

## 2013-02-22 ENCOUNTER — Emergency Department (HOSPITAL_COMMUNITY): Payer: Medicare Other

## 2013-02-22 ENCOUNTER — Encounter (HOSPITAL_COMMUNITY): Payer: Self-pay | Admitting: Emergency Medicine

## 2013-02-22 ENCOUNTER — Inpatient Hospital Stay (HOSPITAL_COMMUNITY)
Admission: EM | Admit: 2013-02-22 | Discharge: 2013-02-24 | DRG: 637 | Disposition: A | Discharge status: Skilled Nursing Facility | Payer: Medicare Other | Attending: Family Medicine | Admitting: Family Medicine

## 2013-02-22 DIAGNOSIS — R1310 Dysphagia, unspecified: Secondary | ICD-10-CM

## 2013-02-22 DIAGNOSIS — G9340 Encephalopathy, unspecified: Secondary | ICD-10-CM

## 2013-02-22 DIAGNOSIS — R29810 Facial weakness: Secondary | ICD-10-CM

## 2013-02-22 DIAGNOSIS — R29898 Other symptoms and signs involving the musculoskeletal system: Secondary | ICD-10-CM | POA: Diagnosis present

## 2013-02-22 DIAGNOSIS — Z9181 History of falling: Secondary | ICD-10-CM

## 2013-02-22 DIAGNOSIS — Z66 Do not resuscitate: Secondary | ICD-10-CM | POA: Diagnosis present

## 2013-02-22 DIAGNOSIS — Z79899 Other long term (current) drug therapy: Secondary | ICD-10-CM

## 2013-02-22 DIAGNOSIS — K1120 Sialoadenitis, unspecified: Secondary | ICD-10-CM

## 2013-02-22 DIAGNOSIS — M545 Low back pain, unspecified: Secondary | ICD-10-CM | POA: Diagnosis present

## 2013-02-22 DIAGNOSIS — N179 Acute kidney failure, unspecified: Secondary | ICD-10-CM

## 2013-02-22 DIAGNOSIS — G8929 Other chronic pain: Secondary | ICD-10-CM | POA: Diagnosis present

## 2013-02-22 DIAGNOSIS — Z9183 Wandering in diseases classified elsewhere: Secondary | ICD-10-CM | POA: Diagnosis present

## 2013-02-22 DIAGNOSIS — E162 Hypoglycemia, unspecified: Secondary | ICD-10-CM | POA: Diagnosis present

## 2013-02-22 DIAGNOSIS — E1169 Type 2 diabetes mellitus with other specified complication: Principal | ICD-10-CM | POA: Diagnosis present

## 2013-02-22 DIAGNOSIS — I129 Hypertensive chronic kidney disease with stage 1 through stage 4 chronic kidney disease, or unspecified chronic kidney disease: Secondary | ICD-10-CM | POA: Diagnosis present

## 2013-02-22 DIAGNOSIS — E785 Hyperlipidemia, unspecified: Secondary | ICD-10-CM

## 2013-02-22 DIAGNOSIS — E860 Dehydration: Secondary | ICD-10-CM

## 2013-02-22 DIAGNOSIS — R4182 Altered mental status, unspecified: Secondary | ICD-10-CM

## 2013-02-22 DIAGNOSIS — Z7982 Long term (current) use of aspirin: Secondary | ICD-10-CM

## 2013-02-22 DIAGNOSIS — I10 Essential (primary) hypertension: Secondary | ICD-10-CM | POA: Diagnosis present

## 2013-02-22 DIAGNOSIS — E870 Hyperosmolality and hypernatremia: Secondary | ICD-10-CM

## 2013-02-22 DIAGNOSIS — G40909 Epilepsy, unspecified, not intractable, without status epilepticus: Secondary | ICD-10-CM

## 2013-02-22 DIAGNOSIS — N183 Chronic kidney disease, stage 3 unspecified: Secondary | ICD-10-CM

## 2013-02-22 DIAGNOSIS — E119 Type 2 diabetes mellitus without complications: Secondary | ICD-10-CM

## 2013-02-22 DIAGNOSIS — F0390 Unspecified dementia without behavioral disturbance: Secondary | ICD-10-CM

## 2013-02-22 HISTORY — DX: Malignant (primary) neoplasm, unspecified: C80.1

## 2013-02-22 LAB — URINALYSIS, ROUTINE W REFLEX MICROSCOPIC
Leukocytes, UA: NEGATIVE
Nitrite: NEGATIVE
Specific Gravity, Urine: 1.025 (ref 1.005–1.030)
pH: 5 (ref 5.0–8.0)

## 2013-02-22 LAB — COMPREHENSIVE METABOLIC PANEL
BUN: 34 mg/dL — ABNORMAL HIGH (ref 6–23)
Calcium: 10.4 mg/dL (ref 8.4–10.5)
Creatinine, Ser: 1.83 mg/dL — ABNORMAL HIGH (ref 0.50–1.35)
GFR calc Af Amer: 38 mL/min — ABNORMAL LOW (ref >90)
Glucose, Bld: 116 mg/dL — ABNORMAL HIGH (ref 70–99)
Sodium: 138 mEq/L (ref 135–145)
Total Protein: 7.6 g/dL (ref 6.0–8.3)

## 2013-02-22 LAB — VALPROIC ACID LEVEL: Valproic Acid Lvl: 30.1 ug/mL — ABNORMAL LOW (ref 50.0–100.0)

## 2013-02-22 LAB — GLUCOSE, CAPILLARY
Glucose-Capillary: 85 mg/dL (ref 70–99)
Glucose-Capillary: 72 mg/dL (ref 70–99)
Glucose-Capillary: 94 mg/dL (ref 70–99)

## 2013-02-22 LAB — CBC WITH DIFFERENTIAL/PLATELET
Eosinophils Absolute: 0.1 10*3/uL (ref 0.0–0.7)
Eosinophils Relative: 1 % (ref 0–5)
Lymphs Abs: 1.9 10*3/uL (ref 0.7–4.0)
MCH: 30.6 pg (ref 26.0–34.0)
MCV: 91.2 fL (ref 78.0–100.0)
Platelets: 162 10*3/uL (ref 150–400)
RBC: 3.96 MIL/uL — ABNORMAL LOW (ref 4.22–5.81)

## 2013-02-22 LAB — URINE MICROSCOPIC-ADD ON

## 2013-02-22 MED ORDER — LEVETIRACETAM 500 MG PO TABS
500.0000 mg | ORAL_TABLET | Freq: Two times a day (BID) | ORAL | Status: DC
  Administered 2013-02-23 – 2013-02-24 (×2): 500 mg via ORAL
  Filled 2013-02-22 (×5): qty 1

## 2013-02-22 MED ORDER — FERROUS SULFATE 325 (65 FE) MG PO TABS
325.0000 mg | ORAL_TABLET | Freq: Every day | ORAL | Status: DC
  Administered 2013-02-23 – 2013-02-24 (×2): 325 mg via ORAL
  Filled 2013-02-22 (×3): qty 1

## 2013-02-22 MED ORDER — SENNOSIDES-DOCUSATE SODIUM 8.6-50 MG PO TABS: 1.0000 | ORAL_TABLET | Freq: Every evening | ORAL | Status: DC | PRN

## 2013-02-22 MED ORDER — ACETAMINOPHEN 325 MG PO TABS: 650.0000 mg | ORAL_TABLET | ORAL | Status: DC | PRN

## 2013-02-22 MED ORDER — ATENOLOL 50 MG PO TABS
50.0000 mg | ORAL_TABLET | Freq: Every day | ORAL | Status: DC
  Administered 2013-02-23: 50 mg via ORAL
  Filled 2013-02-22 (×2): qty 1

## 2013-02-22 MED ORDER — ENOXAPARIN SODIUM 40 MG/0.4ML ~~LOC~~ SOLN
40.0000 mg | SUBCUTANEOUS | Status: DC
  Administered 2013-02-22: 40 mg via SUBCUTANEOUS
  Filled 2013-02-22: qty 0.4

## 2013-02-22 MED ORDER — ACETAMINOPHEN 650 MG RE SUPP: 650.0000 mg | RECTAL | Status: DC | PRN

## 2013-02-22 MED ORDER — SODIUM CHLORIDE 0.9 % IV SOLN
INTRAVENOUS | Status: DC
  Administered 2013-02-22: 20:00:00 via INTRAVENOUS

## 2013-02-22 MED ORDER — PANTOPRAZOLE SODIUM 40 MG PO TBEC
40.0000 mg | DELAYED_RELEASE_TABLET | Freq: Every day | ORAL | Status: DC
  Administered 2013-02-23 – 2013-02-24 (×2): 40 mg via ORAL
  Filled 2013-02-22 (×2): qty 1

## 2013-02-22 MED ORDER — ONDANSETRON HCL 4 MG/2ML IJ SOLN: 4.0000 mg | Freq: Four times a day (QID) | INTRAMUSCULAR | Status: DC | PRN

## 2013-02-22 MED ORDER — ADULT MULTIVITAMIN W/MINERALS CH
1.0000 | ORAL_TABLET | Freq: Every day | ORAL | Status: DC
  Administered 2013-02-23 – 2013-02-24 (×2): 1 via ORAL
  Filled 2013-02-22 (×2): qty 1

## 2013-02-22 MED ORDER — CLOPIDOGREL BISULFATE 75 MG PO TABS
75.0000 mg | ORAL_TABLET | Freq: Every day | ORAL | Status: DC
  Administered 2013-02-23 – 2013-02-24 (×2): 75 mg via ORAL
  Filled 2013-02-22 (×4): qty 1

## 2013-02-22 MED ORDER — DIVALPROEX SODIUM 125 MG PO CPSP
125.0000 mg | ORAL_CAPSULE | ORAL | Status: DC
  Administered 2013-02-23 – 2013-02-24 (×4): 125 mg via ORAL
  Filled 2013-02-22 (×7): qty 1

## 2013-02-22 MED ORDER — ONDANSETRON HCL 4 MG/2ML IJ SOLN: 4.0000 mg | Freq: Three times a day (TID) | INTRAMUSCULAR | Status: AC | PRN

## 2013-02-22 MED ORDER — DIVALPROEX SODIUM 125 MG PO CPSP
250.0000 mg | ORAL_CAPSULE | Freq: Every day | ORAL | Status: DC
  Administered 2013-02-23 – 2013-02-24 (×2): 250 mg via ORAL
  Filled 2013-02-22 (×3): qty 2

## 2013-02-22 MED ORDER — FINASTERIDE 5 MG PO TABS
5.0000 mg | ORAL_TABLET | Freq: Every day | ORAL | Status: DC
  Administered 2013-02-23 – 2013-02-24 (×2): 5 mg via ORAL
  Filled 2013-02-22 (×2): qty 1

## 2013-02-22 MED ORDER — MEMANTINE HCL ER 14 MG PO CP24
14.0000 mg | ORAL_CAPSULE | Freq: Every day | ORAL | Status: DC
  Administered 2013-02-24: 14 mg via ORAL
  Filled 2013-02-22 (×2): qty 14

## 2013-02-22 MED ORDER — LORAZEPAM 1 MG PO TABS: 1.0000 mg | ORAL_TABLET | Freq: Four times a day (QID) | ORAL | Status: DC | PRN

## 2013-02-22 MED ORDER — SIMVASTATIN 20 MG PO TABS
20.0000 mg | ORAL_TABLET | Freq: Every evening | ORAL | Status: DC
  Administered 2013-02-23 – 2013-02-24 (×2): 20 mg via ORAL
  Filled 2013-02-22 (×3): qty 1

## 2013-02-22 NOTE — ED Notes (Signed)
Patient to MRI.

## 2013-02-22 NOTE — ED Provider Notes (Signed)
CSN: [id number]     Arrival date & time 02/22/13  1205 History   First MD Initiated Contact with Patient 02/22/13 1207     Chief Complaint  Patient presents with  . Hypoglycemia   (Consider location/radiation/quality/duration/timing/severity/associated sxs/prior Treatment) HPI Comments: 79 year old male with a history of diabetes, dementia, seizure disorder as well as hypertension. Level V caveat applies secondary to dementia. The patient was found wandering around his nursing facility confused, paramedics found the patient to have a blood sugar of 54, they gave him 25 g of oral glucose which resulted in him feeling better. His blood sugar came up to a level of approximately 80. The patient denies eating anything today, the patient was found in the dining room waiting for lunch. According to the nursing staff at [facility] place they were concerned because the patient was not walking or talking though he was awake and alert. The patient is unable to give me any further history.  Patient is a 79 y.o. male presenting with hypoglycemia. The history is provided by the patient, the EMS personnel and medical records.  Hypoglycemia   Past Medical History  Diagnosis Date  . Hypertension   . Hyperlipidemia   . Seizure disorder   . CKD (chronic kidney disease), stage III   . Altered mental state 08/31/2012    "came into hospital a little disoriented; it's gotten worse; this is not normal" (08/31/2012)  . Seizures   . Type II diabetes mellitus   . Chronic lower back pain   . Frequent falls   . brain tumor dx'd 2002    surg only; refused xrt last yr per family member   Past Surgical History  Procedure Laterality Date  . Brain tumor excision    . 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 Hypertension Daughter    History  Substance Use Topics  . Smoking status: Never Smoker   . Smokeless tobacco: Never Used  . Alcohol Use: No    Review of Systems   Unable to perform ROS: Dementia    Allergies  Review of patient's allergies indicates no known allergies.  Home Medications   Current Outpatient Rx  Name  Route  Sig  Dispense  Refill  . aspirin EC 81 MG tablet   Oral   Take 81 mg by mouth daily.         Marland Kitchen atenolol (TENORMIN) 50 MG tablet   Oral   Take 50 mg by mouth daily.         . cholecalciferol (VITAMIN D) 400 UNITS TABS tablet   Oral   Take 400 Units by mouth daily.         . divalproex (DEPAKOTE SPRINKLE) 125 MG capsule   Oral   Take 125-250 mg by mouth 4 (four) times daily. Take 2 capsules at 8 am and 1 at noon 1 at 4 pm and 1 at 8 pm         . ferrous sulfate 325 (65 FE) MG tablet   Oral   Take 325 mg by mouth daily with breakfast.         . finasteride (PROSCAR) 5 MG tablet   Oral   Take 5 mg by mouth daily.         Marland Kitchen levETIRAcetam (KEPPRA) 500 MG tablet   Oral   Take 500 mg by mouth every 12 (twelve) hours.         Marland Kitchen LORazepam (  ATIVAN) 1 MG tablet   Oral   Take 1 mg by mouth every 6 (six) hours as needed for anxiety.          . Memantine HCl ER (NAMENDA XR) 14 MG CP24   Oral   Take 1 capsule by mouth daily. For 7 days         . metFORMIN (GLUCOPHAGE-XR) 500 MG 24 hr tablet   Oral   Take 500 mg by mouth 2 (two) times daily.         . Multiple Vitamin (MULTIVITAMIN WITH MINERALS) TABS tablet   Oral   Take 1 tablet by mouth daily.         . pantoprazole (PROTONIX) 40 MG tablet   Oral   Take 40 mg by mouth daily.         . simvastatin (ZOCOR) 20 MG tablet   Oral   Take 20 mg by mouth every evening.          BP 147/81  Pulse 56  Temp(Src) 98.2 F (36.8 C) (Oral)  Resp 16  SpO2 100% Physical Exam  Nursing note and vitals reviewed. Constitutional: He appears well-developed and well-nourished. No distress.  HENT:  Head: Normocephalic and atraumatic.  Mouth/Throat: Oropharynx is clear and moist. No oropharyngeal exudate.  Eyes: Conjunctivae and EOM are normal.  Pupils are equal, round, and reactive to light. Right eye exhibits no discharge. Left eye exhibits no discharge. No scleral icterus.  Neck: Normal range of motion. Neck supple. No JVD present. No thyromegaly present.  Cardiovascular: Normal rate, regular rhythm, normal heart sounds and intact distal pulses.  Exam reveals no gallop and no friction rub.   No murmur heard. Pulmonary/Chest: Effort normal and breath sounds normal. No respiratory distress. He has no wheezes. He has no rales.  Abdominal: Soft. Bowel sounds are normal. He exhibits no distension and no mass. There is no tenderness.  Musculoskeletal: Normal range of motion. He exhibits no edema and no tenderness.  Lymphadenopathy:    He has no cervical adenopathy.  Neurological: He is alert. Coordination normal.  Speech is clear, memory is not intact, he does not know where he lives, he does not know if he lives in a nursing facility, he is able to follow commands and has equal grip strength bilaterally, he is able to straight leg raise and touch my hand with a straight leg at 2 feet off the bed without difficulty, cranial nerves III through XII appear to be intact except for slight L facial droop, extraocular movements and pupillary exam are normal.  Skin: Skin is warm and dry. No rash noted. No erythema.   Mild abrasion to the right upper scalp, consistent with prior fall and head injury, this is not acute and there is no associated hematoma or swelling or depression.  Psychiatric: He has a normal mood and affect. His behavior is normal.    ED Course  Procedures (including critical care time) Labs Review Labs Reviewed  CBC WITH DIFFERENTIAL - Abnormal; Notable for the following:    RBC 3.96 (*)    Hemoglobin 12.1 (*)    HCT 36.1 (*)    All other components within normal limits  COMPREHENSIVE METABOLIC PANEL - Abnormal; Notable for the following:    Glucose, Bld 116 (*)    BUN 34 (*)    Creatinine, Ser 1.83 (*)    GFR calc non Af  Amer 33 (*)    GFR calc Af Amer 38 (*)  All other components within normal limits  GLUCOSE, CAPILLARY  TROPONIN I  GLUCOSE, CAPILLARY  URINALYSIS, ROUTINE W REFLEX MICROSCOPIC   Imaging Review Ct Head Wo Contrast  02/22/2013   *RADIOLOGY REPORT*  Clinical Data: Chest pain.  History of brain surgery. Hypertension.  CT HEAD WITHOUT CONTRAST  Technique:  Contiguous axial images were obtained from the base of the skull through the vertex without contrast.  Comparison: 02/17/2013.  Findings: Bifrontal encephalomalacia is present which is postsurgical.  Bilateral frontal craniotomies.  Chronic paranasal sinus disease.  Persistent mass in the inferior anterior cranial fossa compatible with meningioma.  No interval change compared to prior exam.  No midline shift, hydrocephalus, or hemorrhage.  No evidence of skull fracture.  IMPRESSION: No acute intracranial abnormality or interval change.  Right frontal craniotomies with encephalomalacia and residual anterior cranial fossa/skull base meningioma.   Original Report Authenticated By: Andreas Newport, M.D.   Dg Chest Port 1 View  02/22/2013   CLINICAL DATA:  Chest pain.  EXAM: PORTABLE CHEST - 1 VIEW  COMPARISON:  02/17/2013 and 09/27/2012  FINDINGS: Mild cardiomegaly and ectasia of thoracic aorta appear stable. Both lungs are clear.  IMPRESSION: Stable cardiomegaly. No acute findings.   Electronically Signed   By: Myles Rosenthal   On: 02/22/2013 14:06    MDM   1. Hypoglycemia   2. Facial droop    The patient is hypoglycemic, there is no significant reason for him to be hypoglycemic by history though it is limited by his dementia. Blood sugar has improved with oral glucose, vital signs at this time are unremarkable, it is likely related to not eating today though this is an unreliable history and he will need some workup here to rule out other sources.  ED ECG REPORT  I personally interpreted this EKG   Date: 02/22/2013   Rate: 57  Rhythm: sinus  bradycardia  QRS Axis: normal  Intervals: normal  ST/T Wave abnormalities: normal  Conduction Disutrbances:none  Narrative Interpretation:   Old EKG Reviewed: 02/04/13 - no sig changes since  Pt has resolved facial droop -  family member now arrives and states that last night the patient had slurred speech and is sure that his pressure was normal as yet eaten dinner just before. She is also fairly sure that he ate this morning though she has not 100%, at this time his blood sugar is normal, blood work and EKG and chest x-ray without definitive etiology, recent CT scan of the head did not show any signs of intracranial injury, she requested the patient be admitted to the hospital for further evaluation and given the fact that he did have some facial droop on arrival with slurred speech last night it is reasonable to further evaluate the patient for possible TIA or stroke.  D/w Dr. Janee Morn who will admit. - requests transfer to [facility].  Vida Roller, MD 02/22/13 [phone]

## 2013-02-22 NOTE — Progress Notes (Addendum)
CSW met with pt at bedside.  Pt could not confirm where he lived.  Pt reported that he had not seen his daughters since last week and he thought they were at home.   Per pts nurse, pts daughter's had just recently left.  CSW confirmed through pts chart that he is a resident at [facility].  Pts daughters, Laural Golden and Britta Mccreedy, share HCPOA for their father.  The family plans for pt to return to [facility] when he is medically stable.    Marva Panda, Theresia Majors  [phone] .02/22/2013

## 2013-02-22 NOTE — ED Notes (Signed)
Report called to 4N at [facility], [facility] contacted for pt transport.

## 2013-02-22 NOTE — ED Notes (Signed)
Patient eating sandwich drinking orange juice and milk.

## 2013-02-22 NOTE — ED Notes (Signed)
Bed: ZO10 Expected date:  Expected time:  Means of arrival:  Comments: EMS-hypoglycemic

## 2013-02-22 NOTE — ED Notes (Signed)
Family reports patient has had problems in the past with dysphagia and aspiration due to seizures.  Patient had food to eat initially ordered as a regular diet to stabilize his blood sugar.  No difficulty swallowing noted at that time.  Patient not coughing, able to handle secretions, 02 sats 100% on room air.

## 2013-02-22 NOTE — ED Notes (Signed)
Patient from [facility], staff called EMS because patient was not walking or talking.  Staff did not check CBG.  Patient was in dining room waiting for lunch.  EMS took CBG-54.  Patient given 25 grams of oral glucose by EMS and reports feeling better now.  CBG now 81.  Patient reports he has not had anything to eat today but EMS states that patient has history of dementia and alzheimers.

## 2013-02-22 NOTE — H&P (Signed)
[facility] History and Physical  Kurt [name] ZOX:[id number] DOB: 1928/12/12 DOA: 02/22/2013  Referring physician: Dr Hyacinth Meeker PCP: No primary provider on file.  Specialists:   Chief Complaint: AMS  HPI: Kurt [name] is a 79 y.o. male   with PMHX of HTN/Hyperlipidemia/seizure d/o/DM/ckd III/frequent falls/hx brain tumor resident at [facility] place memory unit who presents to ED with AMS. Patient can't quite tell me exactly what happened and a such history is obtained from the ED physician as well as family. The ED physician patient was found wandering around the nursing facility confused. When paramedics arrived the patient's blood sugar was noted to be 54. The given some oral glucose which resulted in patient feeling better with blood sugars coming up to the 80s. Patient had denied eating anything. Due to concern at [facility] place patient was subsequently transferred to the ED. In the ED per ED physician he noted that patient had a left facial droop which subsequently resolved in the ED. Patient's daughter also noted that patient had some slurred speech which have subsequently improved. Patient's daughter also stated that the night prior to admission after dinner patient had some slurred speech as well. Patient does endorse that his CBGs have been in the 40s lately. Patient denies any fevers, no chills, no nausea, no vomiting, no abdominal pain, no chest pain, no shortness of breath no dysuria, and no diarrhea, no constipation. Per daughter patient has had increased falls recently. Patient's daughter said that patient had some complaints of headache. In the ED CT of the head which was done was negative. Urinalysis done was negative. Comprehensive metabolic profile and a BUN of 34 creatinine of 1.83 glucose of 116. First set of troponin was negative. CBC had a white count of 12.1 otherwise was within normal limits. Chest x-ray was negative. We were called to admit the patient for further  evaluation and management.  Review of Systems: The patient denies anorexia, fever, weight loss,, vision loss, decreased hearing, hoarseness, chest pain, syncope, dyspnea on exertion, peripheral edema, balance deficits, hemoptysis, abdominal pain, melena, hematochezia, severe indigestion/heartburn, hematuria, incontinence, genital sores, muscle weakness, suspicious skin lesions, transient blindness, difficulty walking, depression, unusual weight change, abnormal bleeding, enlarged lymph nodes, angioedema, and breast masses.   Past Medical History  Diagnosis Date  . Hypertension   . Hyperlipidemia   . Seizure disorder   . CKD (chronic kidney disease), stage III   . Altered mental state 08/31/2012    "came into hospital a little disoriented; it's gotten worse; this is not normal" (08/31/2012)  . Seizures   . Type II diabetes mellitus   . Chronic lower back pain   . Frequent falls   . brain tumor dx'd 2002    surg only; refused xrt last yr per family member   Past Surgical History  Procedure Laterality Date  . Brain tumor excision    . Brain meningioma excision  08/2000   Social History:  reports that he has never smoked. He has never used smokeless tobacco. He reports that he does not drink alcohol or use illicit drugs.  No Known Allergies  Family History  Problem Relation Age of Onset  . Hypertension Daughter     Prior to Admission medications   Medication Sig Start Date End Date Taking? Authorizing Provider  aspirin EC 81 MG tablet Take 81 mg by mouth daily.   Yes Historical Provider, MD  atenolol (TENORMIN) 50 MG tablet Take 50 mg by mouth daily.   Yes Historical Provider,  MD  cholecalciferol (VITAMIN D) 400 UNITS TABS tablet Take 400 Units by mouth daily.   Yes Historical Provider, MD  divalproex (DEPAKOTE SPRINKLE) 125 MG capsule Take 125-250 mg by mouth 4 (four) times daily. Take 2 capsules at 8 am and 1 at noon 1 at 4 pm and 1 at 8 pm   Yes Historical Provider, MD  ferrous  sulfate 325 (65 FE) MG tablet Take 325 mg by mouth daily with breakfast.   Yes Historical Provider, MD  finasteride (PROSCAR) 5 MG tablet Take 5 mg by mouth daily.   Yes Historical Provider, MD  levETIRAcetam (KEPPRA) 500 MG tablet Take 500 mg by mouth every 12 (twelve) hours.   Yes Historical Provider, MD  LORazepam (ATIVAN) 1 MG tablet Take 1 mg by mouth every 6 (six) hours as needed for anxiety.    Yes Historical Provider, MD  Memantine HCl ER (NAMENDA XR) 14 MG CP24 Take 1 capsule by mouth daily. For 7 days 02/18/13 02/25/13 Yes Historical Provider, MD  metFORMIN (GLUCOPHAGE-XR) 500 MG 24 hr tablet Take 500 mg by mouth 2 (two) times daily.   Yes Historical Provider, MD  Multiple Vitamin (MULTIVITAMIN WITH MINERALS) TABS tablet Take 1 tablet by mouth daily.   Yes Historical Provider, MD  pantoprazole (PROTONIX) 40 MG tablet Take 40 mg by mouth daily.   Yes Historical Provider, MD  simvastatin (ZOCOR) 20 MG tablet Take 20 mg by mouth every evening.   Yes Historical Provider, MD   Physical Exam: Filed Vitals:   02/22/13 1555  BP: 167/67  Pulse: 59  Temp: 98.2 F (36.8 C)  Resp: 14     General:  Well-developed well-nourished in no acute cardiopulmonary distress.  Eyes: Arcus senalis. Pupils equal round and reactive to light and accommodation. Extraocular movements intact.  ENT: Oropharynx is clear, no lesions, no exudates.  Neck: Supple no lymphadenopathy. No JVD.  Cardiovascular: Regular rate and rhythm no murmurs rubs or gallops. No JVD. No lower extremity edema.  Respiratory:  Clear to auscultation Bilaterally. No wheezes, no crackles, no rhonchi.  Abdomen: Soft, nontender, nondistended, positive bowel sounds.  Skin: No rashes or lesions.  Musculoskeletal: 5/5 BUE strength, 5/5 BLE strength  Psychiatric: Normal mood. Normal affect. Poor insight. Poor judgment.  Neurologic: Alert to self. Cranial nerves II through XII are grossly intact. No focal deficits. Sensation is  intact. Visual fields are intact. Unable to elicit reflexes symmetrically and diffuse. Gait not tested secondary to safety.  Labs on Admission:  Basic Metabolic Panel:  Recent Labs Lab 02/17/13 1540 02/22/13 1315  NA 140 138  K 4.2 4.6  CL 107 103  CO2 27 27  GLUCOSE 99 116*  BUN 39* 34*  CREATININE 1.92* 1.83*  CALCIUM 10.2 10.4   Liver Function Tests:  Recent Labs Lab 02/22/13 1315  AST 15  ALT 11  ALKPHOS 63  BILITOT 0.3  PROT 7.6  ALBUMIN 3.6   No results found for this basename: LIPASE, AMYLASE,  in the last 168 hours No results found for this basename: AMMONIA,  in the last 168 hours CBC:  Recent Labs Lab 02/17/13 1540 02/22/13 1315  WBC 4.5 5.7  NEUTROABS 2.0 3.3  HGB 10.9* 12.1*  HCT 32.3* 36.1*  MCV 92.0 91.2  PLT 135* 162   Cardiac Enzymes:  Recent Labs Lab 02/22/13 1315  TROPONINI <0.30    BNP (last 3 results) No results found for this basename: PROBNP,  in the last 8760 hours CBG:  Recent Labs Lab  02/22/13 1215 02/22/13 1452  GLUCAP 85 72    Radiological Exams on Admission: Ct Head Wo Contrast  02/22/2013   *RADIOLOGY REPORT*  Clinical Data: Chest pain.  History of brain surgery. Hypertension.  CT HEAD WITHOUT CONTRAST  Technique:  Contiguous axial images were obtained from the base of the skull through the vertex without contrast.  Comparison: 02/17/2013.  Findings: Bifrontal encephalomalacia is present which is postsurgical.  Bilateral frontal craniotomies.  Chronic paranasal sinus disease.  Persistent mass in the inferior anterior cranial fossa compatible with meningioma.  No interval change compared to prior exam.  No midline shift, hydrocephalus, or hemorrhage.  No evidence of skull fracture.  IMPRESSION: No acute intracranial abnormality or interval change.  Right frontal craniotomies with encephalomalacia and residual anterior cranial fossa/skull base meningioma.   Original Report Authenticated By: Andreas Newport, M.D.   Dg Chest  Port 1 View  02/22/2013   CLINICAL DATA:  Chest pain.  EXAM: PORTABLE CHEST - 1 VIEW  COMPARISON:  02/17/2013 and 09/27/2012  FINDINGS: Mild cardiomegaly and ectasia of thoracic aorta appear stable. Both lungs are clear.  IMPRESSION: Stable cardiomegaly. No acute findings.   Electronically Signed   By: Myles Rosenthal   On: 02/22/2013 14:06    EKG: Independently reviewed. Normal sinus rhythm  Assessment/Plan Principal Problem:   Acute encephalopathy Active Problems:   Seizure disorder, with left leg weakness (Todds phenomenon)   Hypertension   Diabetes mellitus without complication   Hyperlipidemia   CKD (chronic kidney disease), stage III   Dysphagia   Dementia   #1 acute encephalopathy Concern for acute stroke as patient did have some facial weakness per ED physician which has since resolved and family noted some slurred speech the night prior to admission and on the day of admission. Differential could also include seizures, as family endorses some blank staring and some bowel incontinence in the past. Differentials could also include a progressive worsening dementia. Will admit the patient to telemetry. Head CT was negative. We'll get a stroke workup underway including the MRI and MRA of the head, carotid Dopplers, 2-D echo, fasting lipid panel, hemoglobin A1c. We'll also check a EEG. We'll also check a Depakote level. Patient was on aspirin prior to admission and due to concern for an acute stroke we'll place patient on Plavix. Continue home dose statin. Continue blood pressure regimen. We'll consult with neurology for further evaluation and management.  #2 history of seizure disorder Will check a Depakote level. Continue home regimen of Depakote and Keppra. Check a EEG. Neurology consultation pending.  #3 hypertension Stable. Continue home regimen of atenolol, Proscar.  #4 type 2 diabetes Check a hemoglobin A1c. Check CBGs every 4 hours for the next one to 2 days. Hold metformin.  Sliding scale insulin.  #5 hyperlipidemia Check a fasting lipid panel. Continue statin.  #6 chronic kidney disease stage III Baseline creatinine about 1.85. At baseline. Follow.  #7 dementia Continue Namenda.  #8 falls ?? etiology. May be secondary to debility. Stroke workup underway. PT/OT.  #8 prophylaxis PPI for GI prophylaxis. Lovenox for DVT prophylaxis.   Code Status: DNR Family Communication: Updated patient, daughters and son at bedside. Disposition Plan: Admit to telemetry  Time spent: 65 mins  [facility] Pager [phone]  If 7PM-7AM, please contact night-coverage [email or web address] Password [facility] 02/22/2013, 4:54 PM

## 2013-02-22 NOTE — ED Notes (Signed)
XR bedside.

## 2013-02-23 ENCOUNTER — Inpatient Hospital Stay (HOSPITAL_COMMUNITY): Payer: Medicare Other

## 2013-02-23 DIAGNOSIS — N179 Acute kidney failure, unspecified: Secondary | ICD-10-CM

## 2013-02-23 DIAGNOSIS — R4182 Altered mental status, unspecified: Secondary | ICD-10-CM

## 2013-02-23 DIAGNOSIS — G9340 Encephalopathy, unspecified: Secondary | ICD-10-CM

## 2013-02-23 DIAGNOSIS — E162 Hypoglycemia, unspecified: Secondary | ICD-10-CM | POA: Diagnosis present

## 2013-02-23 LAB — MRSA PCR SCREENING: MRSA by PCR: POSITIVE — AB

## 2013-02-23 LAB — GLUCOSE, CAPILLARY
Glucose-Capillary: 68 mg/dL — ABNORMAL LOW (ref 70–99)
Glucose-Capillary: 108 mg/dL — ABNORMAL HIGH (ref 70–99)

## 2013-02-23 MED ORDER — SODIUM CHLORIDE 0.9 % IV SOLN
500.0000 mg | Freq: Once | INTRAVENOUS | Status: AC
  Administered 2013-02-23: 500 mg via INTRAVENOUS
  Filled 2013-02-23: qty 5

## 2013-02-23 MED ORDER — DEXTROSE-NACL 5-0.9 % IV SOLN
INTRAVENOUS | Status: DC
  Administered 2013-02-23: 09:00:00 via INTRAVENOUS

## 2013-02-23 MED ORDER — VALPROATE SODIUM 500 MG/5ML IV SOLN
125.0000 mg | Freq: Once | INTRAVENOUS | Status: AC
  Administered 2013-02-23: 125 mg via INTRAVENOUS
  Filled 2013-02-23: qty 1.25

## 2013-02-23 MED ORDER — ENOXAPARIN SODIUM 40 MG/0.4ML ~~LOC~~ SOLN
40.0000 mg | SUBCUTANEOUS | Status: DC
  Administered 2013-02-23 – 2013-02-24 (×2): 40 mg via SUBCUTANEOUS
  Filled 2013-02-23 (×2): qty 0.4

## 2013-02-23 MED ORDER — DEXTROSE 50 % IV SOLN
25.0000 mL | Freq: Once | INTRAVENOUS | Status: AC
  Administered 2013-02-23: 25 mL via INTRAVENOUS

## 2013-02-23 MED ORDER — DEXTROSE 5 % IV SOLN
125.0000 mg | Freq: Once | INTRAVENOUS | Status: AC
  Administered 2013-02-23: 125 mg via INTRAVENOUS
  Filled 2013-02-23: qty 1.25

## 2013-02-23 MED ORDER — INFLUENZA VAC SPLIT QUAD 0.5 ML IM SUSP
0.5000 mL | INTRAMUSCULAR | Status: AC
  Administered 2013-02-24: 0.5 mL via INTRAMUSCULAR
  Filled 2013-02-23: qty 0.5

## 2013-02-23 NOTE — Progress Notes (Signed)
EEG Completed; Results Pending  

## 2013-02-23 NOTE — Progress Notes (Signed)
[facility] PROGRESS NOTE  Kurt [name] ZOX:[id number] DOB: 07-28-1928 DOA: 02/22/2013 PCP: No primary provider on file.  Assessment/Plan: #1 acute encephalopathy  Less likely that this is a stroke as his MRI is showing no acute findings.  Likely the changes are related to hypoglycemia.  He has had hypoglycemia persistently over night, will change IVs to D5NS.  Monitor BS every 4 hours.  Pt had a cbg of 60 this morning and received D50. Mental status is improving now.  Speech therapy to see patient this morning and provide diet recommendations.  No further stroke work up needed per neuro. EEG is pending.    #2 history of seizure disorder  Depakote level reviewed.  Continue home regimen of Depakote and Keppra. Check a EEG. Neurology consultation.   #3 hypertension  Stable. Continue home regimen of atenolol, Proscar.   #4 type 2 diabetes with hypoglycemia Hemoglobin A1c. Check CBGs every 4 hours for the next one to 2 days. Holding metformin and likely will not restart this at discharge as his renal function is poor.   #5 hyperlipidemia  Check a fasting lipid panel. Continue statin.   #6 chronic kidney disease stage III  Baseline creatinine about 1.85. At baseline. Follow.   #7 dementia  Continue Namenda.   #8 falls  Likely multifactorial. May be secondary to debility. Stroke workup underway. PT/OT.   #8 prophylaxis  PPI for GI prophylaxis. Lovenox for DVT prophylaxis.   Code Status: DNR  Family Communication: Updated patient, daughters and son at bedside.  Disposition Plan: Admit to telemetry  HPI/Subjective: Pt is sitting up in a chair today.  He appears comfortable and able to answer yes and no but confused.  He does follow commands.   Objective: Filed Vitals:   02/23/13 0530  BP: 169/70  Pulse: 58  Temp: 97.6 F (36.4 C)  Resp: 16    Intake/Output Summary (Last 24 hours) at 02/23/13 0823 Last data filed at 02/22/13 1531  Gross per 24 hour  Intake      0  ml  Output    900 ml  Net   -900 ml   Filed Weights   02/22/13 2306  Weight: 90.1 kg (198 lb 10.2 oz)   Exam:  General: Well-developed well-nourished in no acute cardiopulmonary distress.  Eyes:  Pupils equal round and reactive to light and accommodation. Extraocular movements intact.  ENT: Oropharynx is clear, no lesions, no exudates.  Neck: Supple no lymphadenopathy. No JVD.  Cardiovascular: Regular rate and rhythm no murmurs rubs or gallops. No JVD. No lower extremity edema.  Respiratory: Clear to auscultation Bilaterally. No wheezes, no crackles, no rhonchi.  Abdomen: Soft, nontender, nondistended, positive bowel sounds.  Skin: No rashes or lesions.  Musculoskeletal: 5/5 BUE strength, 5/5 BLE strength  Psychiatric:  Normal affect. Poor insight. Poor judgment.  Neurologic: Alert to self. Cranial nerves II through XII are grossly intact.   Data Reviewed: Basic Metabolic Panel:  Recent Labs Lab 02/17/13 1540 02/22/13 1315  NA 140 138  K 4.2 4.6  CL 107 103  CO2 27 27  GLUCOSE 99 116*  BUN 39* 34*  CREATININE 1.92* 1.83*  CALCIUM 10.2 10.4   Liver Function Tests:  Recent Labs Lab 02/22/13 1315  AST 15  ALT 11  ALKPHOS 63  BILITOT 0.3  PROT 7.6  ALBUMIN 3.6   No results found for this basename: LIPASE, AMYLASE,  in the last 168 hours No results found for this basename: AMMONIA,  in the last  168 hours CBC:  Recent Labs Lab 02/17/13 1540 02/22/13 1315  WBC 4.5 5.7  NEUTROABS 2.0 3.3  HGB 10.9* 12.1*  HCT 32.3* 36.1*  MCV 92.0 91.2  PLT 135* 162   Cardiac Enzymes:  Recent Labs Lab 02/22/13 1315  TROPONINI <0.30   BNP (last 3 results) No results found for this basename: PROBNP,  in the last 8760 hours CBG:  Recent Labs Lab 02/22/13 1215 02/22/13 1452 02/22/13 2213 02/23/13 0413  GLUCAP 85 72 94 75    Recent Results (from the past 240 hour(s))  MRSA PCR SCREENING     Status: Abnormal   Collection Time    02/22/13 11:54 PM      Result  Value Range Status   MRSA by PCR POSITIVE (*) NEGATIVE Final   Comment:            The GeneXpert MRSA Assay (FDA     approved for NASAL specimens     only), is one component of a     comprehensive MRSA colonization     surveillance program. It is not     intended to diagnose MRSA     infection nor to guide or     monitor treatment for     MRSA infections.     RESULT CALLED TO, READ BACK BY AND VERIFIED WITH:     J. PENNELL (RN) [phone] 02/23/2013 L.LOMAX     Studies: Ct Head Wo Contrast  02/22/2013   *RADIOLOGY REPORT*  Clinical Data: Chest pain.  History of brain surgery. Hypertension.  CT HEAD WITHOUT CONTRAST  Technique:  Contiguous axial images were obtained from the base of the skull through the vertex without contrast.  Comparison: 02/17/2013.  Findings: Bifrontal encephalomalacia is present which is postsurgical.  Bilateral frontal craniotomies.  Chronic paranasal sinus disease.  Persistent mass in the inferior anterior cranial fossa compatible with meningioma.  No interval change compared to prior exam.  No midline shift, hydrocephalus, or hemorrhage.  No evidence of skull fracture.  IMPRESSION: No acute intracranial abnormality or interval change.  Right frontal craniotomies with encephalomalacia and residual anterior cranial fossa/skull base meningioma.   Original Report Authenticated By: Andreas Newport, M.D.   Mr [facility] Wo Contrast  02/22/2013   *RADIOLOGY REPORT*  Clinical Data:  Disoriented. Dementia.  Prior brain tumor removal (meningioma).  Diabetic.  MRI BRAIN WITHOUT CONTRAST MRA HEAD WITHOUT CONTRAST  Technique: Multiplanar, multiecho pulse sequences of the brain and surrounding structures were obtained according to standard protocol without intravenous contrast.  Angiographic images of the head were obtained using MRA technique without contrast.  Comparison: 02/22/2013 CT.  08/30/2012 MR.  MRI HEAD  Findings:  No acute infarct.  Remote frontal lobe craniotomy for resection of a  meningioma per history provided.  Marked amount of encephalomalacia frontal lobes. Additionally, bulky residual tumor extends from the anterior subfrontal region and through the skull base into the ethmoid sinus air cells, sphenoid sinus air cells and involving the pterygoid plates (greater on the left).  Post obstructive secretions. Overall appears relatively similar to the prior exam.  Atrophy.  Ventricular prominence may be related to an atrophy and encephalomalacia from prior surgery/tumor.  A component of mild hydrocephalus is difficult to completely excluded.  Old overall appearance without significant change.  Major intracranial vascular structures are patent.  No intracranial hemorrhage.  IMPRESSION: Similar appearance of postoperative changes and residual bulky tumor as detailed above.  No acute infarct.  MRA HEAD  Findings: Motion degraded exam.  Anterior circulation without medium or large size vessel significant stenosis or occlusion.  Middle cerebral artery branch vessel irregularity narrowing greater on the left.  Right vertebral artery is dominant.  Narrowing of the distal vertebral arteries bilaterally.  Ectatic basilar artery with mild narrowing without high-grade stenosis.  Nonvisualization AICAs.  Irregularity and narrowing of portions of the superior cerebellar artery and distal posterior cerebral artery bilaterally.  No obvious aneurysm noted on this motion degraded examination.  IMPRESSION: Intracranial atherosclerotic type changes predominately involving branch vessels as detailed above.   Original Report Authenticated By: Lacy Duverney, M.D.   Mr Brain Wo Contrast  02/22/2013   *RADIOLOGY REPORT*  Clinical Data:  Disoriented. Dementia.  Prior brain tumor removal (meningioma).  Diabetic.  MRI BRAIN WITHOUT CONTRAST MRA HEAD WITHOUT CONTRAST  Technique: Multiplanar, multiecho pulse sequences of the brain and surrounding structures were obtained according to standard protocol without  intravenous contrast.  Angiographic images of the head were obtained using MRA technique without contrast.  Comparison: 02/22/2013 CT.  08/30/2012 MR.  MRI HEAD  Findings:  No acute infarct.  Remote frontal lobe craniotomy for resection of a meningioma per history provided.  Marked amount of encephalomalacia frontal lobes. Additionally, bulky residual tumor extends from the anterior subfrontal region and through the skull base into the ethmoid sinus air cells, sphenoid sinus air cells and involving the pterygoid plates (greater on the left).  Post obstructive secretions. Overall appears relatively similar to the prior exam.  Atrophy.  Ventricular prominence may be related to an atrophy and encephalomalacia from prior surgery/tumor.  A component of mild hydrocephalus is difficult to completely excluded.  Old overall appearance without significant change.  Major intracranial vascular structures are patent.  No intracranial hemorrhage.  IMPRESSION: Similar appearance of postoperative changes and residual bulky tumor as detailed above.  No acute infarct.  MRA HEAD  Findings: Motion degraded exam.  Anterior circulation without medium or large size vessel significant stenosis or occlusion.  Middle cerebral artery branch vessel irregularity narrowing greater on the left.  Right vertebral artery is dominant.  Narrowing of the distal vertebral arteries bilaterally.  Ectatic basilar artery with mild narrowing without high-grade stenosis.  Nonvisualization AICAs.  Irregularity and narrowing of portions of the superior cerebellar artery and distal posterior cerebral artery bilaterally.  No obvious aneurysm noted on this motion degraded examination.  IMPRESSION: Intracranial atherosclerotic type changes predominately involving branch vessels as detailed above.   Original Report Authenticated By: Lacy Duverney, M.D.   Dg Chest Port 1 View  02/22/2013   CLINICAL DATA:  Chest pain.  EXAM: PORTABLE CHEST - 1 VIEW  COMPARISON:   02/17/2013 and 09/27/2012  FINDINGS: Mild cardiomegaly and ectasia of thoracic aorta appear stable. Both lungs are clear.  IMPRESSION: Stable cardiomegaly. No acute findings.   Electronically Signed   By: Myles Rosenthal   On: 02/22/2013 14:06    Scheduled Meds: . atenolol  50 mg Oral Daily  . clopidogrel  75 mg Oral Q breakfast  . divalproex  125 mg Oral Custom  . divalproex  250 mg Oral Q breakfast  . ferrous sulfate  325 mg Oral Q breakfast  . finasteride  5 mg Oral Daily  . levETIRAcetam  500 mg Oral Q12H  . Memantine HCl ER  14 mg Oral Daily  . multivitamin with minerals  1 tablet Oral Daily  . pantoprazole  40 mg Oral Daily  . simvastatin  20 mg Oral QPM   Continuous Infusions: . dextrose 5 % and  0.9% NaCl      Principal Problem:   Acute encephalopathy Active Problems:   Seizure disorder, with left leg weakness (Todds phenomenon)   Hypertension   Diabetes mellitus without complication   Hyperlipidemia   CKD (chronic kidney disease), stage III   Dysphagia   Dementia   Idona Stach [facility]  [facility] Pager [phone]. If 7PM-7AM, please contact night-coverage at [email or web address], password [facility] 02/23/2013, 8:23 AM  LOS: 1 day

## 2013-02-23 NOTE — Progress Notes (Signed)
OT Cancellation Note  Patient Details Name: Kurt [name] MRN: [id number] DOB: Oct 26, 1928   Cancelled Treatment:    Reason Eval/Treat Not Completed: Other (comment)  Order received, reviewed chart. Pt planning to return to SNF. Please re-order if OT eval is needed. Thank you. Please page number below.    Earlie Raveling OTR/L [phone] 02/23/2013, 8:01 AM

## 2013-02-23 NOTE — Progress Notes (Signed)
MEDICATION RELATED NOTE    Pharmacy Consult for Enoxaparin Indication: DVT prophylaxis  No Known Allergies  Patient Measurements: Height: 6\' 3"  (190.5 cm) Weight: 198 lb 10.2 oz (90.1 kg) IBW/kg (Calculated) : 84.5  Labs:  Recent Labs  02/22/13 1315  WBC 5.7  HGB 12.1*  HCT 36.1*  PLT 162  CREATININE 1.83*   Estimated Creatinine Clearance: 37.8 ml/min (by C-G formula based on Cr of 1.83).   Assessment: 79yo male with risk factors for thrombosis to be started on SQ Lovenox for DVT prevention.  His H/H this morning reveals some anemia but no noted acute bleeding issues.  He has some renal dysfunction with a creatinine of 1.83 and an estimated clearance of 40ml/min, his dose does not require adjustment unless his clearance falls below 30 ml/min.  Goal of Therapy:  DVT prevention  Plan:  1.  Lovenox 40mg  SQ daily 2.  Monitor CBC closely 3.  Adjust for renal clearance < 87ml/min  Nadara Mustard, PharmD., MS Clinical Pharmacist Pager:  [phone] Thank you for allowing pharmacy to be part of this patients care team. 02/23/2013,9:45 AM

## 2013-02-23 NOTE — Evaluation (Signed)
Physical Therapy Evaluation Patient Details Name: Kurt [name] MRN: [id number] DOB: 09-19-1928 Today's Date: 02/23/2013 Time: 4098-1191 PT Time Calculation (min): 27 min  PT Assessment / Plan / Recommendation History of Present Illness  Kurt [name] is an 79 y.o. male who was brought to the hospital after being found wandering around his nursing facility. He was confused.  Staff noted a facial droop and family noted some slurred speech  When blood sugar was choked it was low at 54.  Patient was brought in for evaluation.  Glucose has been administered. Patient is improved but patient was admitted for futther work-up; Pt had meningioma excision in 2002.  Clinical Impression  Pt will benefit from PT to address deficits below; May be able to return to ALF setting with PT vs SNF depending  on progress and level of assist ALF able to provide  PT Assessment  Patient needs continued PT services    Follow Up Recommendations  SNF (vs return to ALF setting with HHPT f/u)    Does the patient have the potential to tolerate intense rehabilitation      Barriers to Discharge        Equipment Recommendations  None recommended by PT    Recommendations for Other Services     Frequency Min 3X/week    Precautions / Restrictions Precautions Precautions: Fall Restrictions Weight Bearing Restrictions: No   Pertinent Vitals/Pain Denies pain      Mobility  Bed Mobility Bed Mobility: Sit to Supine Sit to Supine: 4: Min guard;5: Supervision Details for Bed Mobility Assistance: cues for task completion Transfers Transfers: Sit to Stand;Stand to Sit Sit to Stand: 4: Min assist;From chair/3-in-1 Stand to Sit: To bed;4: Min assist Details for Transfer Assistance: multi-modal cues for wt shift, hand placement; pt requires increased time Ambulation/Gait Ambulation/Gait Assistance: 4: Min assist Ambulation Distance (Feet): 150 Feet Assistive device: Rolling walker Ambulation/Gait Assistance  Details: multi-modal cues for safety, RW distance from self and posture; pt requires assist to avoid obstacles on his right side; he denies visual deficits; visual scanning improved with cues for this and head turning to right Gait Pattern: Step-through pattern;Trunk flexed Gait velocity: decr    Exercises     PT Diagnosis: Difficulty walking  PT Problem List: Decreased strength;Decreased activity tolerance;Decreased balance;Decreased mobility;Decreased safety awareness;Decreased knowledge of use of DME PT Treatment Interventions: DME instruction;Gait training;Functional mobility training;Therapeutic activities;Therapeutic exercise;Patient/family education     PT Goals(Current goals can be found in the care plan section) Acute Rehab PT Goals Patient Stated Goal: to go home PT Goal Formulation: With patient/family Time For Goal Achievement: 03/09/13 Potential to Achieve Goals: Good  Visit Information  Last PT Received On: 02/23/13 Assistance Needed: +1 History of Present Illness: Kurt [name] is an 79 y.o. male who was brought to the hospital after being found wandering around his nursing facility. He was confused.  Staff noted a facial droop and family noted some slurred speech  When blood sugar was choked it was low at 54.  Patient was brought in for evaluation.  Glucose has been administered. Patient is improved but patient was admitted for futther work-up       Prior Functioning  Home Living Family/patient expects to be discharged to:: Skilled nursing facility Additional Comments: pt was in ALF level care before adm Prior Function Level of Independence: Independent with assistive device(s) Comments: pt had assist with bathing per dtr Communication Communication: Expressive difficulties;HOH Dominant Hand: Right    Cognition  Cognition Arousal/Alertness: Awake/alert  Overall Cognitive Status: History of cognitive impairments - at baseline    Extremity/Trunk Assessment Upper  Extremity Assessment Upper Extremity Assessment: Defer to OT evaluation Lower Extremity Assessment Lower Extremity Assessment: Generalized weakness   Balance Static Sitting Balance Static Sitting - Balance Support: No upper extremity supported;Feet supported Static Sitting - Level of Assistance: 5: Stand by assistance Static Standing Balance Static Standing - Balance Support: Bilateral upper extremity supported Static Standing - Level of Assistance: 4: Min assist;5: Stand by assistance  End of Session PT - End of Session Equipment Utilized During Treatment: Gait belt Activity Tolerance: Patient tolerated treatment well Patient left: in bed;with family/visitor present;with call bell/phone within reach Nurse Communication: Mobility status  GP     [facility] 02/23/2013, 11:56 AM

## 2013-02-23 NOTE — Clinical Social Work Psychosocial (Signed)
Clinical Social Work Department BRIEF PSYCHOSOCIAL ASSESSMENT 02/23/2013  Patient:  Kurt [name], Kurt [name]     Account Number:  [id number]     Admit date:  02/22/2013  Clinical Social Worker:  Sherre Lain  Date/Time:  02/23/2013 03:25 PM  Referred by:  Physician  Date Referred:  02/23/2013 Referred for  SNF Placement   Other Referral:   none.   Interview type:  Family Other interview type:   none.    PSYCHOSOCIAL DATA Living Status:  FACILITY Admitted from facility:  [facility] Level of care:  Assisted Living Primary support name:  Luisa Dago Primary support relationship to patient:  CHILD, ADULT Degree of support available:   Strong.    CURRENT CONCERNS Current Concerns  Post-Acute Placement   Other Concerns:   none.    SOCIAL WORK ASSESSMENT / PLAN CSW spoke with pt's daughter, Luisa Dago ([phone]), regarding pt's prior living arrangement. Pt's daughter stated that her father was previously living in the memory care unit at [facility] in [place]. Pt's daughter noted that she would prefer for her father to return to [facility] and receive PT through home health, but is open to SNF placement in [place] [facility]. CSW to continue to assist with discharge planning needs.   Assessment/plan status:  Psychosocial Support/Ongoing Assessment of Needs Other assessment/ plan:   none.   Information/referral to community resources:   [facility] placement.    PATIENTS/FAMILYS RESPONSE TO PLAN OF CARE: Pt's daughter was understanding and open to CSW plan of care.       Darlyn Chamber, MSW, LCSWA Clinical Social Work [phone]

## 2013-02-23 NOTE — Significant Event (Signed)
Hypoglycemic Event  CBG: 61  Treatment: D50 IV 25 mL  Symptoms: None  Follow-up CBG: [id number] CBG Result:114  Possible Reasons for Event: Inadequate meal intake patient was NPO- now is started on Dys 3 diet and D5NS fluids  Comments/MD notified:Dr Laural Benes notified    Emmalie Haigh, [place] Marie 02/23/2013 9:35 AM   Remember to initiate Hypoglycemia Order Set & complete

## 2013-02-23 NOTE — Evaluation (Addendum)
Clinical/Bedside Swallow Evaluation Patient Details  Name: Kurt [name] MRN: [id number] Date of Birth: 1928-08-25  Today's Date: 02/23/2013 Time: 0815-0858 SLP Time Calculation (min): 43 min  Past Medical History:  Past Medical History  Diagnosis Date  . Hypertension   . Hyperlipidemia   . Seizure disorder   . CKD (chronic kidney disease), stage III   . Altered mental state 08/31/2012    "came into hospital a little disoriented; it's gotten worse; this is not normal" (08/31/2012)  . Seizures   . Type II diabetes mellitus   . Chronic lower back pain   . Frequent falls   . brain tumor dx'd 2002    surg only; refused xrt last yr per family member   Past Surgical History:  Past Surgical History  Procedure Laterality Date  . Brain tumor excision    . Brain meningioma excision  08/2000   HPI:  pt is an 79 yo male adm to [facility] with AMS, acute encephalopathy likely due to hypoglycemia.  Imaging negative for acute CVA, CXR negative.  Bedside swallow evaluation ordered.  Pt for swallow evaluation due to h/o dysphagia during april 2014 admit when he had submandibular abscess or mass.  Pt has been on a mechanical soft/ground meat diet at [facility] - ? if in memory unit.     Assessment / Plan / Recommendation Clinical Impression  Pt presents with mild oral dysphagia likely due his cognitive deficits, improved swallow ability compared to April admission when pt had submandibular mass/abscess.  No s/s of aspiration.  Slow mastication and mildly delayed swallow initiation noted.  Rec continue mechanical soft/ground meat/thin with precautions.  Skilled intervention included determination of compensation strategies and pt education - pt will likely not recall due to cognitive deficit.  SLP to see pt for speech evaluation as ordered, no follow up re: swallow ability.     Aspiration Risk  Mild    Diet Recommendation Dysphagia 3 (Mechanical Soft);Thin liquid   Liquid Administration via:  Straw;Cup Medication Administration: Whole meds with liquid (one at a time) Supervision: Patient able to self feed Compensations: Slow rate;Small sips/bites Postural Changes and/or Swallow Maneuvers: Seated upright 90 degrees;Upright 30-60 min after meal    Other  Recommendations Oral Care Recommendations: Oral care before and after PO   Follow Up Recommendations  None    Frequency and Duration        Pertinent Vitals/Pain Afebrile, decreased    SLP Swallow Goals  n/a   Swallow Study Prior Functional Status   resides at ALF, on soft/thin    General HPI: pt is an 79 yo male adm to [facility] with AMS, acute encephalopathy likely due to hypoglycemia.  Imaging negative for acute CVA, CXR negative.  Bedside swallow evaluation ordered.  Pt for swallow evaluation due to h/o dysphagia during april 2014 admit when he had submandibular abscess or mass.  Pt has been on a mechanical soft/ground meat diet at [facility] - ? if in memory unit.   Type of Study: Bedside swallow evaluation Diet Prior to this Study: NPO Respiratory Status: Room air History of Recent Intubation: No Behavior/Cognition: Alert;Cooperative;Pleasant mood Oral Cavity - Dentition: Dentures, top;Dentures, bottom Self-Feeding Abilities: Able to feed self Patient Positioning: Upright in chair Baseline Vocal Quality: Clear Volitional Cough: Weak Volitional Swallow: Able to elicit    Oral/Motor/Sensory Function Overall Oral Motor/Sensory Function:  (general weakness) Labial ROM:  (decr) Lingual ROM:  (decr) Facial Strength: Reduced (left decreased) Velum: Within Functional Limits   Ice  Chips Ice chips: Not tested   Thin Liquid Thin Liquid: Impaired Presentation: Self Fed;Cup;Straw Oral Phase Impairments: Reduced lingual movement/coordination Oral Phase Functional Implications: Prolonged oral transit Pharyngeal  Phase Impairments: Suspected delayed Swallow    Nectar Thick Nectar Thick Liquid: Not tested   Honey Thick  Honey Thick Liquid: Not tested   Puree Puree: Impaired Presentation: Self Fed;Spoon Oral Phase Impairments: Reduced lingual movement/coordination Oral Phase Functional Implications: Prolonged oral transit Pharyngeal Phase Impairments: Suspected delayed Swallow   Solid   GO    Solid: Impaired Presentation: Self Fed Oral Phase Impairments: Poor awareness of bolus Oral Phase Functional Implications:  (slow mastication) Pharyngeal Phase Impairments: Suspected delayed Fredric Mare, MS [facility] SLP [phone]

## 2013-02-23 NOTE — Consult Note (Addendum)
Reason for Consult:AMS Referring Physician: York Spaniel  CC: AMS  HPI: Kurt [name] is an 79 y.o. male who was brought to the hospital after being found wandering around his nursing facility. He was confused.  Staff noted a facial droop and family noted some slurred speech  When blood sugar was choked it was low at 54.  Patient was brought in for evaluation.  Glucose has been administered. Patient is improved but patient was admitted for futther work-up. Past Medical History  Diagnosis Date  . Hypertension   . Hyperlipidemia   . Seizure disorder   . CKD (chronic kidney disease), stage III   . Altered mental state 08/31/2012    "came into hospital a little disoriented; it's gotten worse; this is not normal" (08/31/2012)  . Seizures   . Type II diabetes mellitus   . Chronic lower back pain   . Frequent falls   . brain tumor dx'd 2002    surg only; refused xrt last yr per family member    Past Surgical History  Procedure Laterality Date  . Brain tumor excision    . 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 Hypertension Daughter     Social History:  reports that he has never smoked. He has never used smokeless tobacco. He reports that he does not drink alcohol or use illicit drugs.  No Known Allergies  Medications:  I have reviewed the patient's current medications. Prior to Admission:  Prescriptions prior to admission  Medication Sig Dispense Refill  . aspirin EC 81 MG tablet Take 81 mg by mouth daily.      Marland Kitchen atenolol (TENORMIN) 50 MG tablet Take 50 mg by mouth daily.      . cholecalciferol (VITAMIN D) 400 UNITS TABS tablet Take 400 Units by mouth daily.      . divalproex (DEPAKOTE SPRINKLE) 125 MG capsule Take 125-250 mg by mouth 4 (four) times daily. Take 2 capsules at 8 am and 1 at noon 1 at 4 pm and 1 at 8 pm      . ferrous sulfate 325 (65 FE) MG tablet Take 325 mg by mouth daily with breakfast.      . finasteride (PROSCAR) 5 MG  tablet Take 5 mg by mouth daily.      Marland Kitchen levETIRAcetam (KEPPRA) 500 MG tablet Take 500 mg by mouth every 12 (twelve) hours.      Marland Kitchen LORazepam (ATIVAN) 1 MG tablet Take 1 mg by mouth every 6 (six) hours as needed for anxiety.       . Memantine HCl ER (NAMENDA XR) 14 MG CP24 Take 1 capsule by mouth daily. For 7 days      . metFORMIN (GLUCOPHAGE-XR) 500 MG 24 hr tablet Take 500 mg by mouth 2 (two) times daily.      . Multiple Vitamin (MULTIVITAMIN WITH MINERALS) TABS tablet Take 1 tablet by mouth daily.      . pantoprazole (PROTONIX) 40 MG tablet Take 40 mg by mouth daily.      . simvastatin (ZOCOR) 20 MG tablet Take 20 mg by mouth every evening.       Scheduled: . atenolol  50 mg Oral Daily  . clopidogrel  75 mg Oral Q breakfast  . divalproex  125 mg Oral Custom  . divalproex  250 mg Oral Q breakfast  . ferrous sulfate  325 mg Oral Q breakfast  . finasteride  5 mg Oral Daily  .  levETIRAcetam  500 mg Oral Q12H  . Memantine HCl ER  14 mg Oral Daily  . multivitamin with minerals  1 tablet Oral Daily  . pantoprazole  40 mg Oral Daily  . simvastatin  20 mg Oral QPM    ROS: Unable to obtain  Physical Examination: Blood pressure 166/80, pulse 52, temperature 98 F (36.7 C), temperature source Oral, resp. rate 16, height 6\' 3"  (1.905 m), weight 90.1 kg (198 lb 10.2 oz), SpO2 100.00%.  Neurologic Examination Mental Status: Patient is lethergic but can be awakened easily.  Speech is minimal with only one to two word answers.  Follows some simple commands but is unable to follow 3-step commands.   Cranial Nerves: II: Discs flat bilaterally; Visual fields grossly normal with the patient blinking to bilateral confrontation, pupils equal, round, reactive to light and accommodation III,IV, VI: ptosis not present, extra-ocular motions intact bilaterally V,VII: smile symmetric, facial light touch sensation normal bilaterally VIII: hearing normal bilaterally IX,X: gag reflex present XI: bilateral  shoulder shrug XII: midline tongue extension Motor: Moves all extremities  Sensory: Responds to noxious stimuli bilateraterally Deep Tendon Reflexes: 2+ in the upper extremities and absent in the lower extremities  Plantars: Right: mute  Left: mute Cerebellar: Unable to test Gait: Unable to test CV: pulses palpable throughout     Laboratory Studies:   Basic Metabolic Panel:  Recent Labs Lab 02/17/13 1540 02/22/13 1315  NA 140 138  K 4.2 4.6  CL 107 103  CO2 27 27  GLUCOSE 99 116*  BUN 39* 34*  CREATININE 1.92* 1.83*  CALCIUM 10.2 10.4    Liver Function Tests:  Recent Labs Lab 02/22/13 1315  AST 15  ALT 11  ALKPHOS 63  BILITOT 0.3  PROT 7.6  ALBUMIN 3.6   No results found for this basename: LIPASE, AMYLASE,  in the last 168 hours No results found for this basename: AMMONIA,  in the last 168 hours  CBC:  Recent Labs Lab 02/17/13 1540 02/22/13 1315  WBC 4.5 5.7  NEUTROABS 2.0 3.3  HGB 10.9* 12.1*  HCT 32.3* 36.1*  MCV 92.0 91.2  PLT 135* 162    Cardiac Enzymes:  Recent Labs Lab 02/22/13 1315  TROPONINI <0.30    BNP: No components found with this basename: POCBNP,   CBG:  Recent Labs Lab 02/22/13 1215 02/22/13 1452 02/22/13 2213  GLUCAP 85 72 94    Microbiology: Results for orders placed during the hospital encounter of 09/27/12  CULTURE, BLOOD (ROUTINE X 2)     Status: None   Collection Time    09/27/12  8:45 PM      Result Value Range Status   Specimen Description BLOOD LEFT ARM   Final   Special Requests BOTTLES DRAWN AEROBIC AND ANAEROBIC 10CC EACH   Final   Culture  Setup Time 09/28/2012 02:16   Final   Culture NO GROWTH 5 DAYS   Final   Report Status 10/04/2012 FINAL   Final  CULTURE, BLOOD (ROUTINE X 2)     Status: None   Collection Time    09/27/12  8:55 PM      Result Value Range Status   Specimen Description BLOOD RIGHT HAND   Final   Special Requests BOTTLES DRAWN AEROBIC ONLY 5CC   Final   Culture  Setup Time  09/28/2012 02:16   Final   Culture NO GROWTH 5 DAYS   Final   Report Status 10/04/2012 FINAL   Final  MRSA PCR SCREENING  Status: Abnormal   Collection Time    09/28/12 11:34 AM      Result Value Range Status   MRSA by PCR POSITIVE (*) NEGATIVE Final   Comment:            The GeneXpert MRSA Assay (FDA     approved for NASAL specimens     only), is one component of a     comprehensive MRSA colonization     surveillance program. It is not     intended to diagnose MRSA     infection nor to guide or     monitor treatment for     MRSA infections.     RESULT CALLED TO, READ BACK BY AND VERIFIED WITH:     MATTHEWS,H RN @ 1349 ON 09/28/12 BY LEONARD,A    Coagulation Studies: No results found for this basename: LABPROT, INR,  in the last 72 hours  Urinalysis:  Recent Labs Lab 02/17/13 1739 02/22/13 1530  COLORURINE YELLOW YELLOW  LABSPEC 1.024 1.025  PHURINE 5.0 5.0  GLUCOSEU NEGATIVE NEGATIVE  HGBUR NEGATIVE NEGATIVE  BILIRUBINUR NEGATIVE NEGATIVE  KETONESUR NEGATIVE NEGATIVE  PROTEINUR 30* 30*  UROBILINOGEN 0.2 0.2  NITRITE NEGATIVE NEGATIVE  LEUKOCYTESUR NEGATIVE NEGATIVE    Lipid Panel:     Component Value Date/Time   CHOL  Value: 209        ATP III CLASSIFICATION:  <200     mg/dL   Desirable  413-244  mg/dL   Borderline High  >=010    mg/dL   High       * 27/07/5364 0625   TRIG 105 05/11/2009 0625   HDL 40 05/11/2009 0625   CHOLHDL 5.2 05/11/2009 0625   VLDL 21 05/11/2009 0625   LDLCALC  Value: 148        Total Cholesterol/HDL:CHD Risk Coronary Heart Disease Risk Table                     Men   Women  1/2 Average Risk   3.4   3.3  Average Risk       5.0   4.4  2 X Average Risk   9.6   7.1  3 X Average Risk  23.4   11.0        Use the calculated Patient Ratio above and the CHD Risk Table to determine the patient's CHD Risk.        ATP III CLASSIFICATION (LDL):  <100     mg/dL   Optimal  440-347  mg/dL   Near or Above                    Optimal  130-159  mg/dL   Borderline   425-956  mg/dL   High  >387     mg/dL   Very High* 56/09/3327 0625    HgbA1C:  Lab Results  Component Value Date   HGBA1C 5.8* 08/30/2012    Urine Drug Screen:   No results found for this basename: labopia, cocainscrnur, labbenz, amphetmu, thcu, labbarb    Alcohol Level: No results found for this basename: ETH,  in the last 168 hours  Other results: EKG:  normal sinus rhythm at 57 bpm.  Imaging: Ct Head Wo Contrast  02/22/2013   *RADIOLOGY REPORT*  Clinical Data: Chest pain.  History of brain surgery. Hypertension.  CT HEAD WITHOUT CONTRAST  Technique:  Contiguous axial images were obtained from the base of the skull through the vertex  without contrast.  Comparison: 02/17/2013.  Findings: Bifrontal encephalomalacia is present which is postsurgical.  Bilateral frontal craniotomies.  Chronic paranasal sinus disease.  Persistent mass in the inferior anterior cranial fossa compatible with meningioma.  No interval change compared to prior exam.  No midline shift, hydrocephalus, or hemorrhage.  No evidence of skull fracture.  IMPRESSION: No acute intracranial abnormality or interval change.  Right frontal craniotomies with encephalomalacia and residual anterior cranial fossa/skull base meningioma.   Original Report Authenticated By: Andreas Newport, M.D.   Mr [facility] Wo Contrast  02/22/2013   *RADIOLOGY REPORT*  Clinical Data:  Disoriented. Dementia.  Prior brain tumor removal (meningioma).  Diabetic.  MRI BRAIN WITHOUT CONTRAST MRA HEAD WITHOUT CONTRAST  Technique: Multiplanar, multiecho pulse sequences of the brain and surrounding structures were obtained according to standard protocol without intravenous contrast.  Angiographic images of the head were obtained using MRA technique without contrast.  Comparison: 02/22/2013 CT.  08/30/2012 MR.  MRI HEAD  Findings:  No acute infarct.  Remote frontal lobe craniotomy for resection of a meningioma per history provided.  Marked amount of encephalomalacia  frontal lobes. Additionally, bulky residual tumor extends from the anterior subfrontal region and through the skull base into the ethmoid sinus air cells, sphenoid sinus air cells and involving the pterygoid plates (greater on the left).  Post obstructive secretions. Overall appears relatively similar to the prior exam.  Atrophy.  Ventricular prominence may be related to an atrophy and encephalomalacia from prior surgery/tumor.  A component of mild hydrocephalus is difficult to completely excluded.  Old overall appearance without significant change.  Major intracranial vascular structures are patent.  No intracranial hemorrhage.  IMPRESSION: Similar appearance of postoperative changes and residual bulky tumor as detailed above.  No acute infarct.  MRA HEAD  Findings: Motion degraded exam.  Anterior circulation without medium or large size vessel significant stenosis or occlusion.  Middle cerebral artery branch vessel irregularity narrowing greater on the left.  Right vertebral artery is dominant.  Narrowing of the distal vertebral arteries bilaterally.  Ectatic basilar artery with mild narrowing without high-grade stenosis.  Nonvisualization AICAs.  Irregularity and narrowing of portions of the superior cerebellar artery and distal posterior cerebral artery bilaterally.  No obvious aneurysm noted on this motion degraded examination.  IMPRESSION: Intracranial atherosclerotic type changes predominately involving branch vessels as detailed above.   Original Report Authenticated By: Lacy Duverney, M.D.   Mr Brain Wo Contrast  02/22/2013   *RADIOLOGY REPORT*  Clinical Data:  Disoriented. Dementia.  Prior brain tumor removal (meningioma).  Diabetic.  MRI BRAIN WITHOUT CONTRAST MRA HEAD WITHOUT CONTRAST  Technique: Multiplanar, multiecho pulse sequences of the brain and surrounding structures were obtained according to standard protocol without intravenous contrast.  Angiographic images of the head were obtained using  MRA technique without contrast.  Comparison: 02/22/2013 CT.  08/30/2012 MR.  MRI HEAD  Findings:  No acute infarct.  Remote frontal lobe craniotomy for resection of a meningioma per history provided.  Marked amount of encephalomalacia frontal lobes. Additionally, bulky residual tumor extends from the anterior subfrontal region and through the skull base into the ethmoid sinus air cells, sphenoid sinus air cells and involving the pterygoid plates (greater on the left).  Post obstructive secretions. Overall appears relatively similar to the prior exam.  Atrophy.  Ventricular prominence may be related to an atrophy and encephalomalacia from prior surgery/tumor.  A component of mild hydrocephalus is difficult to completely excluded.  Old overall appearance without significant change.  Major  intracranial vascular structures are patent.  No intracranial hemorrhage.  IMPRESSION: Similar appearance of postoperative changes and residual bulky tumor as detailed above.  No acute infarct.  MRA HEAD  Findings: Motion degraded exam.  Anterior circulation without medium or large size vessel significant stenosis or occlusion.  Middle cerebral artery branch vessel irregularity narrowing greater on the left.  Right vertebral artery is dominant.  Narrowing of the distal vertebral arteries bilaterally.  Ectatic basilar artery with mild narrowing without high-grade stenosis.  Nonvisualization AICAs.  Irregularity and narrowing of portions of the superior cerebellar artery and distal posterior cerebral artery bilaterally.  No obvious aneurysm noted on this motion degraded examination.  IMPRESSION: Intracranial atherosclerotic type changes predominately involving branch vessels as detailed above.   Original Report Authenticated By: Lacy Duverney, M.D.   Dg Chest Port 1 View  02/22/2013   CLINICAL DATA:  Chest pain.  EXAM: PORTABLE CHEST - 1 VIEW  COMPARISON:  02/17/2013 and 09/27/2012  FINDINGS: Mild cardiomegaly and ectasia of thoracic  aorta appear stable. Both lungs are clear.  IMPRESSION: Stable cardiomegaly. No acute findings.   Electronically Signed   By: Myles Rosenthal   On: 02/22/2013 14:06     Assessment/Plan: 79 year old male with altered mental status.  He has a history of dementia and this may very well just represent progression of the same.  MRI of the brain reviewed and shows no acute changes making a stroke less likely.  Patient has also had episodes of decreased blood sugar which may be contributing as well.  Further work up recommended.  Stroke work up not necessary.  Seizures controlled with a Depakote level of 50.    Recommendations: 1.  EEG 2.  Agree with addressing blood sugars 3.  Continue Depakote and Keppra at current doses  Thana Farr, MD [facility] [phone] 02/23/2013, 2:48 AM

## 2013-02-24 DIAGNOSIS — E860 Dehydration: Secondary | ICD-10-CM

## 2013-02-24 DIAGNOSIS — F0390 Unspecified dementia without behavioral disturbance: Secondary | ICD-10-CM

## 2013-02-24 LAB — BASIC METABOLIC PANEL
CO2: 25 mEq/L (ref 19–32)
Calcium: 9.6 mg/dL (ref 8.4–10.5)
Glucose, Bld: 104 mg/dL — ABNORMAL HIGH (ref 70–99)
Sodium: 138 mEq/L (ref 135–145)

## 2013-02-24 LAB — GLUCOSE, CAPILLARY
Glucose-Capillary: 85 mg/dL (ref 70–99)
Glucose-Capillary: 94 mg/dL (ref 70–99)

## 2013-02-24 MED ORDER — AMLODIPINE BESYLATE 5 MG PO TABS: 5.0000 mg | ORAL_TABLET | Freq: Every day | ORAL | Status: DC

## 2013-02-24 MED ORDER — AMLODIPINE BESYLATE 5 MG PO TABS
5.0000 mg | ORAL_TABLET | Freq: Every day | ORAL | Status: DC
  Administered 2013-02-24: 5 mg via ORAL
  Filled 2013-02-24: qty 1

## 2013-02-24 NOTE — Care Management Note (Signed)
    Page 1 of 1   02/24/2013     4:25:34 PM   CARE MANAGEMENT NOTE 02/24/2013  Patient:  Kurt [name], Kurt [name]   Account Number:  [id number]  Date Initiated:  02/24/2013  Documentation initiated by:  Elmer Bales  Subjective/Objective Assessment:   Patient admitted for hypoglycemia. From an ALF     Action/Plan:   Will follow for discharge needs   Anticipated DC Date:  02/24/2013   Anticipated DC Plan:  ASSISTED LIVING / REST HOME  In-house referral  Clinical Social Worker      DC Associate Professor  CM consult      Choice offered to / List presented to:             [facility] agency  [employer] [facility]   Status of service:  Completed, signed off Medicare Important Message given?   (If response is "NO", the following Medicare IM given date fields will be blank) Date Medicare IM given:   Date Additional Medicare IM given:    Discharge Disposition:  ASSISTED LIVING  Per UR Regulation:  Reviewed for med. necessity/level of care/duration of stay  If discussed at Long Length of Stay Meetings, dates discussed:    Comments:  02/24/13 1600 Elmer Bales RN, MSN, CM- Spoke with Elnita Maxwell at [facility].  Patient was previously with [facility] at the ALF. Discharge summary with [facility] resumption orders were faxed to [phone], ph# [phone].

## 2013-02-24 NOTE — Progress Notes (Signed)
NEURO HOSPITALIST PROGRESS NOTE   SUBJECTIVE:                                                                                                                        Patient is up eating lunch, has no complaints, knows he is in the hospital but gets the date wrog (lives in a dementia assisted living facility at baseline)  OBJECTIVE:                                                                                                                           Vital signs in last 24 hours: Temp:  [97.7 F (36.5 C)-98.4 F (36.9 C)] 97.7 F (36.5 C) (09/18 0900) Pulse Rate:  [50-56] 50 (09/18 0900) Resp:  [18] 18 (09/18 0900) BP: (155-181)/(54-82) 155/54 mmHg (09/18 0900) SpO2:  [100 %] 100 % (09/18 0900)  Intake/Output from previous day: 09/17 0701 - 09/18 0700 In: 660 [P.O.:660] Out: -  Intake/Output this shift:   Nutritional status: Dysphagia  Past Medical History  Diagnosis Date  . Hypertension   . Hyperlipidemia   . Seizure disorder   . CKD (chronic kidney disease), stage III   . Altered mental state 08/31/2012    "came into hospital a little disoriented; it's gotten worse; this is not normal" (08/31/2012)  . Seizures   . Type II diabetes mellitus   . Chronic lower back pain   . Frequent falls   . brain tumor dx'd 2002    surg only; refused xrt last yr per family member     Neurologic Exam:   Mental Status: Alert, oriented to hospital believes it is 2013 and November (lives in a dementia unit prior to hospitalization)  Speech fluent without evidence of aphasia.  Able to follow 3 step commands without difficulty. Cranial Nerves: II:  Visual fields grossly normal, pupils equal, round, reactive to light and accommodation III,IV, VI: ptosis not present, extra-ocular motions intact bilaterally V,VII: smile symmetric, facial light touch sensation normal bilaterally VIII: hearing normal bilaterally IX,X: gag reflex present XI: bilateral  shoulder shrug XII: midline tongue extension Motor: Right : Upper extremity   5/5    Left:     Upper extremity   5/5  Lower extremity  5/5     Lower extremity   5/5 Tone and bulk:normal tone throughout; no atrophy noted Sensory: Pinprick and light touch intact throughout, bilaterally Deep Tendon Reflexes:  Right: Upper Extremity   Left: Upper extremity   biceps (C-5 to C-6) 2/4   biceps (C-5 to C-6) 2/4 tricep (C7) 2/4    triceps (C7) 2/4 Brachioradialis (C6) 2/4  Brachioradialis (C6) 2/4  Lower Extremity   No KJ or AJ  Plantars: Mute bilaterally Cerebellar: normal finger-to-nose,    Lab Results: Lab Results  Component Value Date/Time   CHOL  Value: 209        ATP III CLASSIFICATION:  <200     mg/dL   Desirable  161-096  mg/dL   Borderline High  >=045    mg/dL   High       * 40/02/8118  6:25 AM   Lipid Panel No results found for this basename: CHOL, TRIG, HDL, CHOLHDL, VLDL, LDLCALC,  in the last 72 hours  Studies/Results: Ct Head Wo Contrast  02/22/2013   *RADIOLOGY REPORT*  Clinical Data: Chest pain.  History of brain surgery. Hypertension.  CT HEAD WITHOUT CONTRAST  Technique:  Contiguous axial images were obtained from the base of the skull through the vertex without contrast.  Comparison: 02/17/2013.  Findings: Bifrontal encephalomalacia is present which is postsurgical.  Bilateral frontal craniotomies.  Chronic paranasal sinus disease.  Persistent mass in the inferior anterior cranial fossa compatible with meningioma.  No interval change compared to prior exam.  No midline shift, hydrocephalus, or hemorrhage.  No evidence of skull fracture.  IMPRESSION: No acute intracranial abnormality or interval change.  Right frontal craniotomies with encephalomalacia and residual anterior cranial fossa/skull base meningioma.   Original Report Authenticated By: Andreas Newport, M.D.   Mr [facility] Wo Contrast  02/22/2013   *RADIOLOGY REPORT*  Clinical Data:  Disoriented. Dementia.  Prior  brain tumor removal (meningioma).  Diabetic.  MRI BRAIN WITHOUT CONTRAST MRA HEAD WITHOUT CONTRAST  Technique: Multiplanar, multiecho pulse sequences of the brain and surrounding structures were obtained according to standard protocol without intravenous contrast.  Angiographic images of the head were obtained using MRA technique without contrast.  Comparison: 02/22/2013 CT.  08/30/2012 MR.  MRI HEAD  Findings:  No acute infarct.  Remote frontal lobe craniotomy for resection of a meningioma per history provided.  Marked amount of encephalomalacia frontal lobes. Additionally, bulky residual tumor extends from the anterior subfrontal region and through the skull base into the ethmoid sinus air cells, sphenoid sinus air cells and involving the pterygoid plates (greater on the left).  Post obstructive secretions. Overall appears relatively similar to the prior exam.  Atrophy.  Ventricular prominence may be related to an atrophy and encephalomalacia from prior surgery/tumor.  A component of mild hydrocephalus is difficult to completely excluded.  Old overall appearance without significant change.  Major intracranial vascular structures are patent.  No intracranial hemorrhage.  IMPRESSION: Similar appearance of postoperative changes and residual bulky tumor as detailed above.  No acute infarct.  MRA HEAD  Findings: Motion degraded exam.  Anterior circulation without medium or large size vessel significant stenosis or occlusion.  Middle cerebral artery branch vessel irregularity narrowing greater on the left.  Right vertebral artery is dominant.  Narrowing of the distal vertebral arteries bilaterally.  Ectatic basilar artery with mild narrowing without high-grade stenosis.  Nonvisualization AICAs.  Irregularity and narrowing of portions of the superior cerebellar artery and distal posterior cerebral artery bilaterally.  No obvious aneurysm  noted on this motion degraded examination.  IMPRESSION: Intracranial atherosclerotic  type changes predominately involving branch vessels as detailed above.   Original Report Authenticated By: Lacy Duverney, M.D.   Mr Brain Wo Contrast  02/22/2013   *RADIOLOGY REPORT*  Clinical Data:  Disoriented. Dementia.  Prior brain tumor removal (meningioma).  Diabetic.  MRI BRAIN WITHOUT CONTRAST MRA HEAD WITHOUT CONTRAST  Technique: Multiplanar, multiecho pulse sequences of the brain and surrounding structures were obtained according to standard protocol without intravenous contrast.  Angiographic images of the head were obtained using MRA technique without contrast.  Comparison: 02/22/2013 CT.  08/30/2012 MR.  MRI HEAD  Findings:  No acute infarct.  Remote frontal lobe craniotomy for resection of a meningioma per history provided.  Marked amount of encephalomalacia frontal lobes. Additionally, bulky residual tumor extends from the anterior subfrontal region and through the skull base into the ethmoid sinus air cells, sphenoid sinus air cells and involving the pterygoid plates (greater on the left).  Post obstructive secretions. Overall appears relatively similar to the prior exam.  Atrophy.  Ventricular prominence may be related to an atrophy and encephalomalacia from prior surgery/tumor.  A component of mild hydrocephalus is difficult to completely excluded.  Old overall appearance without significant change.  Major intracranial vascular structures are patent.  No intracranial hemorrhage.  IMPRESSION: Similar appearance of postoperative changes and residual bulky tumor as detailed above.  No acute infarct.  MRA HEAD  Findings: Motion degraded exam.  Anterior circulation without medium or large size vessel significant stenosis or occlusion.  Middle cerebral artery branch vessel irregularity narrowing greater on the left.  Right vertebral artery is dominant.  Narrowing of the distal vertebral arteries bilaterally.  Ectatic basilar artery with mild narrowing without high-grade stenosis.  Nonvisualization  AICAs.  Irregularity and narrowing of portions of the superior cerebellar artery and distal posterior cerebral artery bilaterally.  No obvious aneurysm noted on this motion degraded examination.  IMPRESSION: Intracranial atherosclerotic type changes predominately involving branch vessels as detailed above.   Original Report Authenticated By: Lacy Duverney, M.D.   Dg Chest Port 1 View  02/22/2013   CLINICAL DATA:  Chest pain.  EXAM: PORTABLE CHEST - 1 VIEW  COMPARISON:  02/17/2013 and 09/27/2012  FINDINGS: Mild cardiomegaly and ectasia of thoracic aorta appear stable. Both lungs are clear.  IMPRESSION: Stable cardiomegaly. No acute findings.   Electronically Signed   By: Myles Rosenthal   On: 02/22/2013 14:06    MEDICATIONS                                                                                                                        Scheduled: . amLODipine  5 mg Oral Daily  . atenolol  50 mg Oral Daily  . clopidogrel  75 mg Oral Q breakfast  . divalproex  125 mg Oral Custom  . divalproex  250 mg Oral Q breakfast  . enoxaparin (LOVENOX) injection  40 mg Subcutaneous Q24H  . ferrous sulfate  325  mg Oral Q breakfast  . finasteride  5 mg Oral Daily  . levETIRAcetam  500 mg Oral Q12H  . Memantine HCl ER  14 mg Oral Daily  . multivitamin with minerals  1 tablet Oral Daily  . pantoprazole  40 mg Oral Daily  . simvastatin  20 mg Oral QPM    ASSESSMENT/PLAN:                                                                                                            79 YO male who lives in dementia unit at baseline. Patient presented with AMS and BG 54 with symptoms resolving after administration of glucose. Currently he is back to baseline, following commands.  EEG showed no epileptiform activity.   Recommend: 1) Continue current dose of Keppra and Depakote  Neurology S/O  No further recommendatios per neurology.      Assessment and plan discussed with with attending physician and they  are in agreement.    Hersh Minney PA-C [facility] [phone]  02/24/2013, 12:05 PM

## 2013-02-24 NOTE — Discharge Summary (Addendum)
Physician Discharge Summary  Kurt [name]:[id number] DOB: 08-19-1928 DOA: 02/22/2013  PCP: No primary provider on file.  Admit date: 02/22/2013 Discharge date: 02/24/2013  Recommendations for Outpatient Follow-up:  1. Monitor blood sugar daily 2. Fall precautions recommended 3. Don't give any more oral diabetes medications 4. Carb modified diet recommended 5. Resume previous home health orders   Discharge Diagnoses:  Principal Problem:   Acute encephalopathy Active Problems:   Seizure disorder, with left leg weakness (Todds phenomenon)   Hypertension   Diabetes mellitus without complication   Hyperlipidemia   CKD (chronic kidney disease), stage III   Dysphagia   Dementia   Hypoglycemia  Discharge Condition: stable    Diet recommendation: carb modified  Filed Weights   02/22/13 2306  Weight: 90.1 kg (198 lb 10.2 oz)    History of present illness:  Kurt [name] is a 79 y.o. male  with PMHX of HTN/Hyperlipidemia/seizure d/o/DM/ckd III/frequent falls/hx brain tumor resident at [facility] place memory unit who presents to ED with AMS. Patient can't quite tell me exactly what happened and a such history is obtained from the ED physician as well as family. The ED physician patient was found wandering around the nursing facility confused. When paramedics arrived the patient's blood sugar was noted to be 54. The given some oral glucose which resulted in patient feeling better with blood sugars coming up to the 80s. Patient had denied eating anything. Due to concern at [facility] place patient was subsequently transferred to the ED. In the ED per ED physician he noted that patient had a left facial droop which subsequently resolved in the ED. Patient's daughter also noted that patient had some slurred speech which have subsequently improved. Patient's daughter also stated that the night prior to admission after dinner patient had some slurred speech as well. Patient does endorse that  his CBGs have been in the 40s lately. Patient denies any fevers, no chills, no nausea, no vomiting, no abdominal pain, no chest pain, no shortness of breath no dysuria, and no diarrhea, no constipation.  Per daughter patient has had increased falls recently. Patient's daughter said that patient had some complaints of headache. In the ED CT of the head which was done was negative. Urinalysis done was negative. Comprehensive metabolic profile and a BUN of 34 creatinine of 1.83 glucose of 116. First set of troponin was negative. CBC had a white count of 12.1 otherwise was within normal limits. Chest x-ray was negative. We were called to admit the patient for further evaluation and management.  Hospital Course:  #1 acute encephalopathy  Less likely that this is a stroke as his MRI is showing no acute findings. Likely the changes are related to hypoglycemia. He has had hypoglycemia persistently over night, will change IVs to D5NS. Monitor BS every 4 hours. Pt had a cbg of 60 this morning and received D50. Mental status is improving now. Speech therapy to see patient this morning and provide diet recommendations. No further stroke work up needed per neuro. EEG is negative.    #2 history of seizure disorder  Depakote level reviewed. Continue home regimen of Depakote and Keppra. Check a EEG. Neurology consultation completed and agreed to keep meds same.   #3 hypertension  Stable. Continue home regimen of atenolol, Proscar. Added amlodipine 5 mg daily.  #4 type 2 diabetes with hypoglycemia  Stopped all oral diabetes medications. Recommending diet control for DM.   #5 hyperlipidemia  Check a fasting lipid panel. Continue statin.   #  6 chronic kidney disease stage III  Baseline creatinine about 1.85. At baseline.  #7 dementia  Continue Namenda.   #8 falls   Likely multifactorial. May be secondary to debility.  .  #8 prophylaxis  PPI for GI prophylaxis. Lovenox for DVT prophylaxis.   Code Status:  DNR  Family Communication: Updated patient, daughters and son Disposition Plan: Return to facility  Consultations:  neurology  Discharge Exam: Pt awake, alert, no distress, no complaints, reading newspaper Filed Vitals:   02/24/13 0900  BP: 155/54  Pulse: 50  Temp: 97.7 F (36.5 C)  Resp: 18   General: awake, alert, no distress Cardiovascular: normal s1, s2 sounds Respiratory: BBS clear   Discharge Instructions  Discharge Orders   Future Orders Complete By Expires   Discharge instructions  As directed    Comments:     Monitor blood sugar daily   Discontinue IV  As directed    Increase activity slowly  As directed        Medication List    STOP taking these medications       metFORMIN 500 MG 24 hr tablet  Commonly known as:  GLUCOPHAGE-XR      TAKE these medications       amLODipine 5 MG tablet  Commonly known as:  NORVASC  Take 1 tablet (5 mg total) by mouth daily.     aspirin EC 81 MG tablet  Take 81 mg by mouth daily.     atenolol 50 MG tablet  Commonly known as:  TENORMIN  Take 50 mg by mouth daily.     cholecalciferol 400 UNITS Tabs tablet  Commonly known as:  VITAMIN D  Take 400 Units by mouth daily.     divalproex 125 MG capsule  Commonly known as:  DEPAKOTE SPRINKLE  Take 125-250 mg by mouth 4 (four) times daily. Take 2 capsules at 8 am and 1 at noon 1 at 4 pm and 1 at 8 pm     ferrous sulfate 325 (65 FE) MG tablet  Take 325 mg by mouth daily with breakfast.     finasteride 5 MG tablet  Commonly known as:  PROSCAR  Take 5 mg by mouth daily.     levETIRAcetam 500 MG tablet  Commonly known as:  KEPPRA  Take 500 mg by mouth every 12 (twelve) hours.     LORazepam 1 MG tablet  Commonly known as:  ATIVAN  Take 1 mg by mouth every 6 (six) hours as needed for anxiety.     multivitamin with minerals Tabs tablet  Take 1 tablet by mouth daily.     NAMENDA XR 14 MG Cp24  Generic drug:  Memantine HCl ER  Take 1 capsule by mouth daily. For 7  days     pantoprazole 40 MG tablet  Commonly known as:  PROTONIX  Take 40 mg by mouth daily.     simvastatin 20 MG tablet  Commonly known as:  ZOCOR  Take 20 mg by mouth every evening.       No Known Allergies  The results of significant diagnostics from this hospitalization (including imaging, microbiology, ancillary and laboratory) are listed below for reference.    Significant Diagnostic Studies: Dg Chest 2 View  02/17/2013   *RADIOLOGY REPORT*  Clinical Data: Recent falls, low back pain, diabetes  CHEST - 2 VIEW  Comparison: Chest x-ray of 09/27/2012  Findings: No active infiltrate or effusion is seen.  Mediastinal contours appear stable in mild cardiomegaly is  stable.  No acute bony abnormality is seen with mild degenerative change throughout the thoracic spine.  IMPRESSION: Stable chest x-ray.  No active lung disease.  Stable mild cardiomegaly.   Original Report Authenticated By: Dwyane Dee, M.D.   Dg Lumbar Spine Complete  02/17/2013   CLINICAL DATA:  Frequent falls, low back pain.  EXAM: LUMBAR SPINE - COMPLETE 4+ VIEW  COMPARISON:  None.  FINDINGS: Diffuse degenerative spurring with large anterior and lateral osteophytes. Normal alignment. No fracture. Mild degenerative facet disease.  There appears to fusion across the SI joints bilaterally. Degenerative changes in the hips bilaterally.  Calcification projects medial to the right kidney and could represent a small renal or renal pelvic stone.  IMPRESSION: Degenerative changes. No fracture or malalignment.   Electronically Signed   By: Charlett Nose M.D.   On: 02/17/2013 16:16   Ct Head Wo Contrast  02/22/2013   *RADIOLOGY REPORT*  Clinical Data: Chest pain.  History of brain surgery. Hypertension.  CT HEAD WITHOUT CONTRAST  Technique:  Contiguous axial images were obtained from the base of the skull through the vertex without contrast.  Comparison: 02/17/2013.  Findings: Bifrontal encephalomalacia is present which is postsurgical.   Bilateral frontal craniotomies.  Chronic paranasal sinus disease.  Persistent mass in the inferior anterior cranial fossa compatible with meningioma.  No interval change compared to prior exam.  No midline shift, hydrocephalus, or hemorrhage.  No evidence of skull fracture.  IMPRESSION: No acute intracranial abnormality or interval change.  Right frontal craniotomies with encephalomalacia and residual anterior cranial fossa/skull base meningioma.   Original Report Authenticated By: Andreas Newport, M.D.   Ct Head Wo Contrast  02/17/2013   CLINICAL DATA:  Frequent falls.  EXAM: CT HEAD WITHOUT CONTRAST  TECHNIQUE: Contiguous axial images were obtained from the base of the skull through the vertex without intravenous contrast.  COMPARISON:  Head CT 02/04/2013  FINDINGS: No intracranial hemorrhage. No parenchymal contusion. No midline shift or mass effect. Basilar cisterns are patent. No skull base fracture. No fluid in the paranasal sinuses or mastoid air cells.  There is a large field of encephalomalacia within the left and right frontal lobes. Evidence of prior frontal craniotomies. There is partially calcified soft tissue within the central skullbase and extending into the sphenoid sinuses. Skull base mass is described as meningioma comparison MRI brain 08/30/2012  No fluid in the mastoid air cells. No evidence of acute fracture  IMPRESSION: 1. No evidence of acute intracranial trauma.  2. Stable bifrontal craniotomies with residual skullbase mass (meningioma) extending into the sphenoid sinuses.   Electronically Signed   By: Genevive Bi M.D.   On: 02/17/2013 16:43   Ct Head Wo Contrast  02/04/2013   *RADIOLOGY REPORT*  Clinical Data:  Fall with pain.  CT HEAD WITHOUT CONTRAST CT CERVICAL SPINE WITHOUT CONTRAST  Technique:  Multidetector CT imaging of the head and cervical spine was performed following the standard protocol without intravenous contrast.  Multiplanar CT image reconstructions of the  cervical spine were also generated.  Comparison:  10/15/2012.  CT HEAD  Findings:  Skull:Status post bifrontal craniotomy for debulking of a central skull base meningioma.  Residual mass, which is partly calcified, better assessed on recent MRI, 08/30/2012. The mass continues to involve the cranial vault and the central skull base.There has been frontal sinus obliteration, with air cells remaining opacified. Bilateral sphenoid sinuses remain completely opacified  and expanded. No acute abnormality, including fracture.  Orbits: No acute abnormality.  Brain: No  evidence of acute abnormality, such as acute infarction, hemorrhage, hydrocephalus, or shift.Bifrontal encephalomalacia to above mass.  There is ex vacuo enlargement of the ventricular system, stable from prior.  IMPRESSION: 1.  No evidence of acute intracranial disease. 2.  Anterior skull base meningioma with chronic, complete obstruction of the sphenoid sinuses.  CT CERVICAL SPINE  Findings: No evidence of acute fracture or subluxation.  Large flowing vertebral body osteophytes, out of proportion to disc space narrowing, resulting in ankylosis of C4-T1.  Multilevel degenerative disc and facet disease additionally.  There is ligamentous thickening ossification in the upper cervical region which narrows the spinal canal.  Facet/uncovertebral spurs result in multilevel osseous neural foraminal stenosis.  IMPRESSION:  1.  Negative for acute fracture or subluxation. 2.  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   Original Report Authenticated By: Tiburcio Pea   Ct Cervical Spine Wo Contrast  02/04/2013   *RADIOLOGY REPORT*  Clinical Data:  Fall with pain.  CT HEAD WITHOUT CONTRAST CT CERVICAL SPINE WITHOUT CONTRAST  Technique:  Multidetector CT imaging of the head and cervical spine was performed following the standard protocol without intravenous contrast.  Multiplanar CT image reconstructions of the cervical spine were  also generated.  Comparison:  10/15/2012.  CT HEAD  Findings:  Skull:Status post bifrontal craniotomy for debulking of a central skull base meningioma.  Residual mass, which is partly calcified, better assessed on recent MRI, 08/30/2012. The mass continues to involve the cranial vault and the central skull base.There has been frontal sinus obliteration, with air cells remaining opacified. Bilateral sphenoid sinuses remain completely opacified  and expanded. No acute abnormality, including fracture.  Orbits: No acute abnormality.  Brain: No evidence of acute abnormality, such as acute infarction, hemorrhage, hydrocephalus, or shift.Bifrontal encephalomalacia to above mass.  There is ex vacuo enlargement of the ventricular system, stable from prior.  IMPRESSION: 1.  No evidence of acute intracranial disease. 2.  Anterior skull base meningioma with chronic, complete obstruction of the sphenoid sinuses.  CT CERVICAL SPINE  Findings: No evidence of acute fracture or subluxation.  Large flowing vertebral body osteophytes, out of proportion to disc space narrowing, resulting in ankylosis of C4-T1.  Multilevel degenerative disc and facet disease additionally.  There is ligamentous thickening ossification in the upper cervical region which narrows the spinal canal.  Facet/uncovertebral spurs result in multilevel osseous neural foraminal stenosis.  IMPRESSION:  1.  Negative for acute fracture or subluxation. 2.  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   Original Report Authenticated By: Tiburcio Pea   Mr [facility] Wo Contrast  02/22/2013   *RADIOLOGY REPORT*  Clinical Data:  Disoriented. Dementia.  Prior brain tumor removal (meningioma).  Diabetic.  MRI BRAIN WITHOUT CONTRAST MRA HEAD WITHOUT CONTRAST  Technique: Multiplanar, multiecho pulse sequences of the brain and surrounding structures were obtained according to standard protocol without intravenous contrast.  Angiographic  images of the head were obtained using MRA technique without contrast.  Comparison: 02/22/2013 CT.  08/30/2012 MR.  MRI HEAD  Findings:  No acute infarct.  Remote frontal lobe craniotomy for resection of a meningioma per history provided.  Marked amount of encephalomalacia frontal lobes. Additionally, bulky residual tumor extends from the anterior subfrontal region and through the skull base into the ethmoid sinus air cells, sphenoid sinus air cells and involving the pterygoid plates (greater on the left).  Post obstructive secretions. Overall appears relatively similar to the prior exam.  Atrophy.  Ventricular prominence  may be related to an atrophy and encephalomalacia from prior surgery/tumor.  A component of mild hydrocephalus is difficult to completely excluded.  Old overall appearance without significant change.  Major intracranial vascular structures are patent.  No intracranial hemorrhage.  IMPRESSION: Similar appearance of postoperative changes and residual bulky tumor as detailed above.  No acute infarct.  MRA HEAD  Findings: Motion degraded exam.  Anterior circulation without medium or large size vessel significant stenosis or occlusion.  Middle cerebral artery branch vessel irregularity narrowing greater on the left.  Right vertebral artery is dominant.  Narrowing of the distal vertebral arteries bilaterally.  Ectatic basilar artery with mild narrowing without high-grade stenosis.  Nonvisualization AICAs.  Irregularity and narrowing of portions of the superior cerebellar artery and distal posterior cerebral artery bilaterally.  No obvious aneurysm noted on this motion degraded examination.  IMPRESSION: Intracranial atherosclerotic type changes predominately involving branch vessels as detailed above.   Original Report Authenticated By: Lacy Duverney, M.D.   Mr Brain Wo Contrast  02/22/2013   *RADIOLOGY REPORT*  Clinical Data:  Disoriented. Dementia.  Prior brain tumor removal (meningioma).  Diabetic.   MRI BRAIN WITHOUT CONTRAST MRA HEAD WITHOUT CONTRAST  Technique: Multiplanar, multiecho pulse sequences of the brain and surrounding structures were obtained according to standard protocol without intravenous contrast.  Angiographic images of the head were obtained using MRA technique without contrast.  Comparison: 02/22/2013 CT.  08/30/2012 MR.  MRI HEAD  Findings:  No acute infarct.  Remote frontal lobe craniotomy for resection of a meningioma per history provided.  Marked amount of encephalomalacia frontal lobes. Additionally, bulky residual tumor extends from the anterior subfrontal region and through the skull base into the ethmoid sinus air cells, sphenoid sinus air cells and involving the pterygoid plates (greater on the left).  Post obstructive secretions. Overall appears relatively similar to the prior exam.  Atrophy.  Ventricular prominence may be related to an atrophy and encephalomalacia from prior surgery/tumor.  A component of mild hydrocephalus is difficult to completely excluded.  Old overall appearance without significant change.  Major intracranial vascular structures are patent.  No intracranial hemorrhage.  IMPRESSION: Similar appearance of postoperative changes and residual bulky tumor as detailed above.  No acute infarct.  MRA HEAD  Findings: Motion degraded exam.  Anterior circulation without medium or large size vessel significant stenosis or occlusion.  Middle cerebral artery branch vessel irregularity narrowing greater on the left.  Right vertebral artery is dominant.  Narrowing of the distal vertebral arteries bilaterally.  Ectatic basilar artery with mild narrowing without high-grade stenosis.  Nonvisualization AICAs.  Irregularity and narrowing of portions of the superior cerebellar artery and distal posterior cerebral artery bilaterally.  No obvious aneurysm noted on this motion degraded examination.  IMPRESSION: Intracranial atherosclerotic type changes predominately involving branch  vessels as detailed above.   Original Report Authenticated By: Lacy Duverney, M.D.   Dg Chest Port 1 View  02/22/2013   CLINICAL DATA:  Chest pain.  EXAM: PORTABLE CHEST - 1 VIEW  COMPARISON:  02/17/2013 and 09/27/2012  FINDINGS: Mild cardiomegaly and ectasia of thoracic aorta appear stable. Both lungs are clear.  IMPRESSION: Stable cardiomegaly. No acute findings.   Electronically Signed   By: Myles Rosenthal   On: 02/22/2013 14:06    Microbiology: Recent Results (from the past 240 hour(s))  MRSA PCR SCREENING     Status: Abnormal   Collection Time    02/22/13 11:54 PM      Result Value Range Status  MRSA by PCR POSITIVE (*) NEGATIVE Final   Comment:            The GeneXpert MRSA Assay (FDA     approved for NASAL specimens     only), is one component of a     comprehensive MRSA colonization     surveillance program. It is not     intended to diagnose MRSA     infection nor to guide or     monitor treatment for     MRSA infections.     RESULT CALLED TO, READ BACK BY AND VERIFIED WITH:     J. PENNELL (RN) [phone] 02/23/2013 L.[facility]     Labs: Basic Metabolic Panel:  Recent Labs Lab 02/17/13 1540 02/22/13 1315 02/24/13 0440  NA 140 138 138  K 4.2 4.6 3.8  CL 107 103 103  CO2 27 27 25   GLUCOSE 99 116* 104*  BUN 39* 34* 28*  CREATININE 1.92* 1.83* 1.55*  CALCIUM 10.2 10.4 9.6   Liver Function Tests:  Recent Labs Lab 02/22/13 1315  AST 15  ALT 11  ALKPHOS 63  BILITOT 0.3  PROT 7.6  ALBUMIN 3.6   No results found for this basename: LIPASE, AMYLASE,  in the last 168 hours No results found for this basename: AMMONIA,  in the last 168 hours CBC:  Recent Labs Lab 02/17/13 1540 02/22/13 1315  WBC 4.5 5.7  NEUTROABS 2.0 3.3  HGB 10.9* 12.1*  HCT 32.3* 36.1*  MCV 92.0 91.2  PLT 135* 162   Cardiac Enzymes:  Recent Labs Lab 02/22/13 1315  TROPONINI <0.30   BNP: BNP (last 3 results) No results found for this basename: PROBNP,  in the last 8760  hours CBG:  Recent Labs Lab 02/23/13 1945 02/23/13 2359 02/24/13 0424 02/24/13 0817 02/24/13 1142  GLUCAP 108* 94 108* 85 94   I spent 34 mins preparing discharge, reviewing records, labs, images, consult notes, preparing summary.   Signed:  Taygen Acklin  [facility] 02/24/2013, 1:05 PM

## 2013-02-24 NOTE — Procedures (Signed)
ELECTROENCEPHALOGRAM REPORT   Patient: Kurt [name]       Room #: 1O10 EEG No. ID: [id number] Age: 79 y.o.        Sex: male Referring Physician: Maryln Manuel Report Date:  02/24/2013        Interpreting Physician: Aline Brochure  History: Kurt [name] is an 79 y.o. male admitted with new onset of confusion as well as transient facial weakness associated with low blood sugar.  Indications for study:  Rule out an encephalopathic state; rule out seizure activity.  Technique: This is an 18 channel routine scalp EEG performed at the bedside with bipolar and monopolar montages arranged in accordance to the international 10/20 system of electrode placement.   Description: This EEG recording was performed during wakefulness and during sleep. Background activity during wakefulness consists of 9 Hz symmetrical alpha rhythm. Photic stimulation was not performed. Hyperventilation was not performed. During sleep there was slowing of background activity with mixed irregular symmetrical delta and theta activity diffusely, as well as frequent occurrences of sleep spindles, vertex waves and arousal responses which were symmetrical. No epileptiform discharges occurred during wakefulness nor during sleep. There are no areas of abnormal slowing.  Interpretation: This is a normal EEG recording during wakefulness and during sleep.   Venetia Maxon M.D. [facility] [phone]

## 2013-02-24 NOTE — Clinical Social Work Note (Addendum)
2:46pm- CSW received approval from [place] at [facility] for pt to return.  The d/c summary has been reviewed/approved by administration.  CSW contacted Molly Maduro with [facility] and received authorization for pt to return to ALF.  No auth number was given.  Pt cannot return via EMS - insurance will not approve.  Pt will need to have own transportation.  RN aware.  1:40pm - CSW faxed d/c summary to [facility] for review.  Pt and daughter 1st choice is for pt to return to [facility], ALF.  CSW needs dc summary completed when appropriate to gain ALF approval for pt to return.  CSW awaiting dc summary.  MD aware.  Vickii Penna, LCSWA [phone]  Clinical Social Work

## 2013-02-24 NOTE — Progress Notes (Signed)
Discharge orders received; vitals stable; no complaints. CBGs have been WNL and patient has been eating adequate at meals. Discharged back to [place] [facility]F with [facility] therapy via [facility].  Godfrey Tritschler, [place] Marie 02/24/2013 5:43 PM

## 2013-02-24 NOTE — Evaluation (Signed)
Speech Language Pathology Evaluation Patient Details Name: Kurt [name] MRN: [id number] DOB: 07/08/1928 Today's Date: 02/24/2013 Time: 4098-1191 SLP Time Calculation (min): 32 min  Problem List:  Patient Active Problem List   Diagnosis Date Noted  . Hypoglycemia 02/23/2013  . Acute encephalopathy 02/22/2013  . Dementia 10/02/2012  . Dehydration 09/28/2012  . Dysphagia 09/28/2012  . Altered mental status 09/27/2012  . ARF (acute renal failure) 09/27/2012  . Acute Submandibular sialoadenitis 09/27/2012  . Hypernatremia 09/27/2012  . Seizure disorder, with left leg weakness (Todds phenomenon) 08/30/2012  . Hypertension   . Diabetes mellitus without complication   . Hyperlipidemia   . CKD (chronic kidney disease), stage III    Past Medical History:  Past Medical History  Diagnosis Date  . Hypertension   . Hyperlipidemia   . Seizure disorder   . CKD (chronic kidney disease), stage III   . Altered mental state 08/31/2012    "came into hospital a little disoriented; it's gotten worse; this is not normal" (08/31/2012)  . Seizures   . Type II diabetes mellitus   . Chronic lower back pain   . Frequent falls   . brain tumor dx'd 2002    surg only; refused xrt last yr per family member   Past Surgical History:  Past Surgical History  Procedure Laterality Date  . Brain tumor excision    . 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  Imaging negative for acute CVA, CXR negative.  Pt underwent a bedside swallow evaluation due to h/o dysphagia during april 2014 admit when he had submandibular abscess or mass.  Pt has been on a mechanical soft/ground meat diet at [facility]  ALF - memory unit. Family hopes for pt to return to ALF.     Assessment / Plan / Recommendation Clinical Impression  Pt presents with cognitive linguistic deficits suspected to be baseline, he is able to articulate his needs  but does not call use and can not retain information for call bell use.  Pt was oriented to self only, able to identify names of 2 of his children from choice of 4 (named 3 others but names not in chart to confirm).  Pt shared a story of how he named his daughter demonstrating intact semantics syntax and use for that conversation.  Long term memory intact but short term impaired.    Pt has very appropriate pragmatics.  He would benefit from 24/7 supervision due to his compromised basic problem solving (use of call bell in current environment, need to call for assist,etc).  As pt was in memory unit prior to this admission, he will have assist needed regardless of SNF or ALF placement.    SLP to sign off as suspect baseline function, please order follow up at next venue if family finds pt's cognition is different than prior to admit to maximize return to baseline function and decrease caregiver burden.      SLP Assessment  Patient does not need any further Speech Lanaguage Pathology Services    Follow Up Recommendations   (slp at next venue if famly finds pt has cognitive changes)    Frequency and Duration   n/a     Pertinent Vitals/Pain Afebrile, decreased   SLP Goals   n/a  SLP Evaluation Prior Functioning  Cognitive/Linguistic Baseline: Baseline deficits Baseline deficit details: dementia at baseline- no family present to establish baseline however  Type of Home: Assisted living (in memory unit) Education: had worked for [employer] Vocation: Retired   IT consultant  Overall Cognitive Status: No family/caregiver present to determine baseline cognitive functioning (h/o dementia) Arousal/Alertness: Awake/alert Orientation Level: Oriented to person;Disoriented to place;Disoriented to situation;Disoriented to time (to time with calendar being given to pt) Memory: Impaired Memory Impairment: Storage deficit;Retrieval deficit;Decreased recall of new information;Decreased long term  memory;Decreased short term memory;Prospective memory Decreased Long Term Memory: Functional basic Decreased Short Term Memory: Functional basic Awareness: Impaired Problem Solving: Impaired Problem Solving Impairment: Functional basic Safety/Judgment: Impaired (pt states she can get up alone, corrected to call assist)    Comprehension  Auditory Comprehension Overall Auditory Comprehension: Impaired Commands: Impaired Two Step Basic Commands: 75-100% accurate (with repetition, transitions difficult for pt) Conversation: Complex Visual Recognition/Discrimination Discrimination: Not tested Reading Comprehension Reading Status:  (read calendar)    Expression Expression Primary Mode of Expression: Verbal Verbal Expression Initiation: No impairment Automatic Speech: Counting (wfl) Level of Generative/Spontaneous Verbalization: Sentence Repetition: No impairment Naming: Not tested Pragmatics: No impairment Written Expression Dominant Hand: Right Written Expression: Not tested   Oral / Motor Oral Motor/Sensory Function Overall Oral Motor/Sensory Function:  (general weakness) Labial ROM:  (decr) Lingual ROM:  (decr) Facial Strength: Reduced (left decreased) Velum: Within Functional Limits Motor Speech Overall Motor Speech: Appears within functional limits for tasks assessed   GO     Donavan Burnet, MS [facility] SLP [phone]

## 2013-05-02 ENCOUNTER — Emergency Department (HOSPITAL_COMMUNITY): Payer: Medicare Other

## 2013-05-02 ENCOUNTER — Emergency Department (HOSPITAL_COMMUNITY)
Admission: EM | Admit: 2013-05-02 | Discharge: 2013-05-02 | Disposition: A | Discharge status: Skilled Nursing Facility | Payer: Medicare Other | Attending: Emergency Medicine | Admitting: Emergency Medicine

## 2013-05-02 ENCOUNTER — Encounter (HOSPITAL_COMMUNITY): Payer: Self-pay | Admitting: Emergency Medicine

## 2013-05-02 DIAGNOSIS — G309 Alzheimer's disease, unspecified: Secondary | ICD-10-CM | POA: Insufficient documentation

## 2013-05-02 DIAGNOSIS — Z79899 Other long term (current) drug therapy: Secondary | ICD-10-CM | POA: Insufficient documentation

## 2013-05-02 DIAGNOSIS — M545 Low back pain, unspecified: Secondary | ICD-10-CM | POA: Insufficient documentation

## 2013-05-02 DIAGNOSIS — G8929 Other chronic pain: Secondary | ICD-10-CM | POA: Insufficient documentation

## 2013-05-02 DIAGNOSIS — E785 Hyperlipidemia, unspecified: Secondary | ICD-10-CM | POA: Insufficient documentation

## 2013-05-02 DIAGNOSIS — M5137 Other intervertebral disc degeneration, lumbosacral region: Secondary | ICD-10-CM | POA: Insufficient documentation

## 2013-05-02 DIAGNOSIS — M51379 Other intervertebral disc degeneration, lumbosacral region without mention of lumbar back pain or lower extremity pain: Secondary | ICD-10-CM | POA: Insufficient documentation

## 2013-05-02 DIAGNOSIS — Z9181 History of falling: Secondary | ICD-10-CM | POA: Insufficient documentation

## 2013-05-02 DIAGNOSIS — G40909 Epilepsy, unspecified, not intractable, without status epilepticus: Secondary | ICD-10-CM | POA: Insufficient documentation

## 2013-05-02 DIAGNOSIS — N183 Chronic kidney disease, stage 3 unspecified: Secondary | ICD-10-CM | POA: Insufficient documentation

## 2013-05-02 DIAGNOSIS — E119 Type 2 diabetes mellitus without complications: Secondary | ICD-10-CM | POA: Insufficient documentation

## 2013-05-02 DIAGNOSIS — Z7982 Long term (current) use of aspirin: Secondary | ICD-10-CM | POA: Insufficient documentation

## 2013-05-02 DIAGNOSIS — F0280 Dementia in other diseases classified elsewhere without behavioral disturbance: Secondary | ICD-10-CM | POA: Insufficient documentation

## 2013-05-02 DIAGNOSIS — I129 Hypertensive chronic kidney disease with stage 1 through stage 4 chronic kidney disease, or unspecified chronic kidney disease: Secondary | ICD-10-CM | POA: Insufficient documentation

## 2013-05-02 DIAGNOSIS — Z86011 Personal history of benign neoplasm of the brain: Secondary | ICD-10-CM | POA: Insufficient documentation

## 2013-05-02 HISTORY — DX: Need for assistance with personal care: Z74.1

## 2013-05-02 HISTORY — DX: Other intervertebral disc degeneration, lumbar region without mention of lumbar back pain or lower extremity pain: M51.369

## 2013-05-02 HISTORY — DX: Other intervertebral disc degeneration, lumbar region: M51.36

## 2013-05-02 HISTORY — DX: Unspecified dementia, unspecified severity, without behavioral disturbance, psychotic disturbance, mood disturbance, and anxiety: F03.90

## 2013-05-02 HISTORY — DX: Dysphagia, unspecified: R13.10

## 2013-05-02 MED ORDER — HYDROCODONE-ACETAMINOPHEN 5-325 MG PO TABS
1.0000 | ORAL_TABLET | Freq: Once | ORAL | Status: AC
  Administered 2013-05-02: 1 via ORAL
  Filled 2013-05-02: qty 1

## 2013-05-02 NOTE — ED Notes (Signed)
[facility] called to transport pt back to [facility].

## 2013-05-02 NOTE — Progress Notes (Signed)
   CARE MANAGEMENT ED NOTE 05/02/2013  Patient:  Kurt [name], Kurt [name]   Account Number:  [id number]  Date Initiated:  05/02/2013  Documentation initiated by:  Edd Arbour  Subjective/Objective Assessment:   79 yr old male blue medicare pt from [facility] without a pcp listed and noted in EPIC notes to be active with [facility] for [facility] only as confirmed by Devita at [facility] [phone]     Subjective/Objective Assessment Detail:   Cm found in pt chart from [facility] that he is seen now by Dr Florentina Jenny     Action/Plan:   CM reviewed EPIC chart Sent text to ED SW to assist with pt return to [facility] wiht Devita at [facility] to confirm services and reviewed his facility chart for new pcp since pt is not able to tell CM   Action/Plan Detail:   Anticipated DC Date:  05/02/2013     Status Recommendation to Physician:   Result of Recommendation:    Other ED Services  Consult Working Plan   In-house referral  Clinical Social Worker   DC Associate Professor  Other  PCP issues  Outpatient Services - Pt will follow up    Choice offered to / List presented to:            Status of service:  Completed, signed off  ED Comments:   ED Comments Detail:

## 2013-05-02 NOTE — ED Notes (Signed)
Pt ambulated with walker ~20 ft

## 2013-05-02 NOTE — ED Provider Notes (Signed)
CSN: [id number]     Arrival date & time 05/02/13  4098 History   First MD Initiated Contact with Patient 05/02/13 0902     Chief Complaint  Patient presents with  . Back Pain    Patient is a 79 y.o. male presenting with back pain. The history is provided by the patient, the EMS personnel and the nursing home. The history is limited by the condition of the patient (Hx dementia).  Back Pain Pt was seen at 0910. Per EMS, NH report and pt, c/o gradual onset and persistence of constant right sided low back "pain" for the past 1 to 2 months. Pt denies any change in his usual chronic pain pattern. Pain worsens with palpation of the area and movement of his body. NH states pt has hx of frequent falls and fell over this past weekend, but denies falls today. NH did not give pt any pain meds today. Denies abd pain, no N/V/D, no CP/SOB, no fevers, no rash, no focal motor weakness, no tingling/numbness in extremities.    Past Medical History  Diagnosis Date  . Hypertension   . Hyperlipidemia   . Seizure disorder   . CKD (chronic kidney disease), stage III   . Altered mental state 08/31/2012    "came into hospital a little disoriented; it's gotten worse; this is not normal" (08/31/2012)  . Type II diabetes mellitus   . Chronic lower back pain   . Frequent falls   . brain tumor dx'd 2002    surg only; refused xrt last yr per family member  . DDD (degenerative disc disease), lumbar   . Dementia   . Seizure disorder     with left leg weakness (Todd's phenomenon)  . Dysphagia   . Assistance needed for mobility     walks with walker   Past Surgical History  Procedure Laterality Date  . Brain tumor excision    . 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 Hypertension Daughter    History  Substance Use Topics  . Smoking status: Never Smoker   . Smokeless tobacco: Never Used  . Alcohol Use: No    Review of Systems  Unable to perform ROS: Dementia   Musculoskeletal: Positive for back pain.    Allergies  Review of patient's allergies indicates no known allergies.  Home Medications   Current Outpatient Rx  Name  Route  Sig  Dispense  Refill  . amLODipine (NORVASC) 2.5 MG tablet   Oral   Take 2.5 mg by mouth daily.         Marland Kitchen aspirin EC 81 MG tablet   Oral   Take 81 mg by mouth daily.         Marland Kitchen atenolol (TENORMIN) 50 MG tablet   Oral   Take 50 mg by mouth daily.         . cholecalciferol (VITAMIN D) 400 UNITS TABS tablet   Oral   Take 400 Units by mouth daily.         . divalproex (DEPAKOTE SPRINKLE) 125 MG capsule   Oral   Take 125-250 mg by mouth 4 (four) times daily. Take 2 capsules by mouth at 8AM, then Take 1 capsule by mouth at noon, 4pm, and 8pm         . ferrous sulfate 325 (65 FE) MG tablet   Oral   Take 325 mg by mouth daily with breakfast.         .  finasteride (PROSCAR) 5 MG tablet   Oral   Take 5 mg by mouth daily.         Marland Kitchen levETIRAcetam (KEPPRA) 500 MG tablet   Oral   Take 500 mg by mouth every 12 (twelve) hours.         Marland Kitchen LORazepam (ATIVAN) 1 MG tablet   Oral   Take 1 mg by mouth every 6 (six) hours as needed for anxiety.          . Memantine HCl ER (NAMENDA XR) 28 MG CP24   Oral   Take 1 capsule by mouth daily.         . Multiple Vitamin (MULTIVITAMIN WITH MINERALS) TABS tablet   Oral   Take 1 tablet by mouth daily.         . pantoprazole (PROTONIX) 40 MG tablet   Oral   Take 40 mg by mouth daily.         . simvastatin (ZOCOR) 20 MG tablet   Oral   Take 20 mg by mouth every evening.         Marland Kitchen EXPIRED: Memantine HCl ER (NAMENDA XR) 14 MG CP24   Oral   Take 1 capsule by mouth daily. For 7 days          BP 146/56  Pulse 54  Temp(Src) 97.9 F (36.6 C) (Oral)  Resp 20  SpO2 99% Physical Exam 0915: Physical examination:  Nursing notes reviewed; Vital signs and O2 SAT reviewed;  Constitutional: Well developed, Well nourished, Well hydrated, In no acute  distress; Head:  Normocephalic, atraumatic; Eyes: EOMI, PERRL, No scleral icterus; ENMT: Mouth and pharynx normal, Mucous membranes moist; Neck: Supple, Full range of motion, No lymphadenopathy; Cardiovascular: Regular rate and rhythm, No gallop; Respiratory: Breath sounds clear & equal bilaterally, No wheezes.  Speaking full sentences with ease, Normal respiratory effort/excursion; Chest: Nontender, Movement normal; Abdomen: Soft, Nontender, Nondistended, Normal bowel sounds; Genitourinary: No CVA tenderness; Spine:  No midline CS, TS, LS tenderness. +TTP right lumbar paraspinal muscles. No abrasions or ecchymosis.;; Extremities: Pulses normal, No tenderness, No edema, No calf edema or asymmetry.; Neuro: Awake, alert, confused re: time, place, events per hx dementia. Major CN grossly intact.  Speech clear. Strength 5/5 equal bilat UE's and LE's, including great toe dorsiflexion.  DTR 2/4 equal bilat UE's and LE's.  No gross sensory deficits.  Neg straight leg raises bilat. Rolls side-to-side on stretcher easily by himself..; Skin: Color normal, Warm, Dry.   ED Course  Procedures    EKG Interpretation   None       MDM  MDM Reviewed: previous chart, nursing note and vitals Reviewed previous: x-ray Interpretation: x-ray   Dg Ribs Unilateral W/chest Right 05/02/2013   CLINICAL DATA:  Fall, back pain rib pain  EXAM: RIGHT RIBS AND CHEST - 3+ VIEW  COMPARISON:  02/22/2013  FINDINGS: Normal heart size and vascularity. Lungs clear. No focal pneumonia, collapse or consolidation. No effusion or pneumothorax. Right ribs appear intact. No displaced rib fracture or focal abnormality. Degenerative changes diffusely of the spine. Atherosclerosis of the aorta.  IMPRESSION: No acute osseous finding or rib abnormality.  No acute chest process.  Stable chest exam.   Electronically Signed   By: Ruel Favors M.D.   On: 05/02/2013 10:18   Dg Lumbar Spine Complete 05/02/2013   CLINICAL DATA:  Right lower back  pain, no trauma  EXAM: LUMBAR SPINE - COMPLETE 4+ VIEW  COMPARISON:  Prior lumbar spine radiographs 02/17/2013  FINDINGS: Frontal, lateral and bilateral oblique radiographs demonstrate no acute fracture or malalignment. Vertebral body heights are maintained. There is multilevel degenerative change with right worse than left anterolateral vertebral spurring most prominent at T12-L1, L2-L3, L3-L4 and L4-L5. Lower lumbar facet arthropathy. No significant interval progression compared to the prior radiographs from September.  IMPRESSION: 1. No acute fracture or malalignment. 2. Similar degree of degenerative disc disease with prominent right anterolateral spurring compared to 02/17/2013.   Electronically Signed   By: Malachy Moan M.D.   On: 05/02/2013 10:24     1115:  Pt ambulated with walker per his baseline around the ED; gait steady, resps easy, NAD. States he feels "better now" and wants to go back to the NH. Dx and testing d/w pt.  Questions answered.  Verb understanding, agreeable to d/c back to NH with outpt f/u.   Laray Anger, DO 05/04/13 2130

## 2013-05-02 NOTE — ED Notes (Signed)
Patient from [facility] with history of alzheimers and dementia c/o right lower back pain that he has had for 6-8 weeks.  Staff reports patient normally ambulates on his own but this morning he didn't want to do anything for himself.  Pain is localized and does not radiate.  Patient denied fall today, but patient fell over the weekend.

## 2013-05-02 NOTE — ED Notes (Signed)
Bed: WA06 Expected date:  Expected time:  Means of arrival:  Comments: ems 

## 2013-07-16 ENCOUNTER — Emergency Department (HOSPITAL_COMMUNITY): Payer: Medicare Other

## 2013-07-16 ENCOUNTER — Encounter (HOSPITAL_COMMUNITY): Payer: Self-pay | Admitting: Emergency Medicine

## 2013-07-16 ENCOUNTER — Inpatient Hospital Stay (HOSPITAL_COMMUNITY)
Admission: EM | Admit: 2013-07-16 | Discharge: 2013-07-21 | DRG: 470 | Disposition: A | Discharge status: Skilled Nursing Facility | Payer: Medicare Other | Attending: Internal Medicine | Admitting: Internal Medicine

## 2013-07-16 DIAGNOSIS — N183 Chronic kidney disease, stage 3 unspecified: Secondary | ICD-10-CM | POA: Diagnosis present

## 2013-07-16 DIAGNOSIS — W19XXXA Unspecified fall, initial encounter: Secondary | ICD-10-CM | POA: Diagnosis present

## 2013-07-16 DIAGNOSIS — D696 Thrombocytopenia, unspecified: Secondary | ICD-10-CM | POA: Diagnosis present

## 2013-07-16 DIAGNOSIS — E876 Hypokalemia: Secondary | ICD-10-CM | POA: Diagnosis not present

## 2013-07-16 DIAGNOSIS — W010XXA Fall on same level from slipping, tripping and stumbling without subsequent striking against object, initial encounter: Secondary | ICD-10-CM | POA: Diagnosis present

## 2013-07-16 DIAGNOSIS — F0390 Unspecified dementia without behavioral disturbance: Secondary | ICD-10-CM | POA: Diagnosis present

## 2013-07-16 DIAGNOSIS — N179 Acute kidney failure, unspecified: Secondary | ICD-10-CM

## 2013-07-16 DIAGNOSIS — R159 Full incontinence of feces: Secondary | ICD-10-CM | POA: Diagnosis not present

## 2013-07-16 DIAGNOSIS — G40909 Epilepsy, unspecified, not intractable, without status epilepticus: Secondary | ICD-10-CM | POA: Diagnosis present

## 2013-07-16 DIAGNOSIS — E870 Hyperosmolality and hypernatremia: Secondary | ICD-10-CM

## 2013-07-16 DIAGNOSIS — S72033A Displaced midcervical fracture of unspecified femur, initial encounter for closed fracture: Principal | ICD-10-CM | POA: Diagnosis present

## 2013-07-16 DIAGNOSIS — K1120 Sialoadenitis, unspecified: Secondary | ICD-10-CM

## 2013-07-16 DIAGNOSIS — IMO0002 Reserved for concepts with insufficient information to code with codable children: Secondary | ICD-10-CM | POA: Diagnosis present

## 2013-07-16 DIAGNOSIS — Z9181 History of falling: Secondary | ICD-10-CM

## 2013-07-16 DIAGNOSIS — Y9210 Unspecified residential institution as the place of occurrence of the external cause: Secondary | ICD-10-CM | POA: Diagnosis present

## 2013-07-16 DIAGNOSIS — G9340 Encephalopathy, unspecified: Secondary | ICD-10-CM

## 2013-07-16 DIAGNOSIS — G8929 Other chronic pain: Secondary | ICD-10-CM | POA: Diagnosis present

## 2013-07-16 DIAGNOSIS — R4182 Altered mental status, unspecified: Secondary | ICD-10-CM

## 2013-07-16 DIAGNOSIS — E162 Hypoglycemia, unspecified: Secondary | ICD-10-CM

## 2013-07-16 DIAGNOSIS — Z791 Long term (current) use of non-steroidal anti-inflammatories (NSAID): Secondary | ICD-10-CM

## 2013-07-16 DIAGNOSIS — M545 Low back pain, unspecified: Secondary | ICD-10-CM | POA: Diagnosis present

## 2013-07-16 DIAGNOSIS — D72829 Elevated white blood cell count, unspecified: Secondary | ICD-10-CM | POA: Diagnosis not present

## 2013-07-16 DIAGNOSIS — S7290XA Unspecified fracture of unspecified femur, initial encounter for closed fracture: Secondary | ICD-10-CM

## 2013-07-16 DIAGNOSIS — I10 Essential (primary) hypertension: Secondary | ICD-10-CM | POA: Diagnosis present

## 2013-07-16 DIAGNOSIS — R197 Diarrhea, unspecified: Secondary | ICD-10-CM | POA: Diagnosis not present

## 2013-07-16 DIAGNOSIS — Z79899 Other long term (current) drug therapy: Secondary | ICD-10-CM

## 2013-07-16 DIAGNOSIS — I129 Hypertensive chronic kidney disease with stage 1 through stage 4 chronic kidney disease, or unspecified chronic kidney disease: Secondary | ICD-10-CM | POA: Diagnosis present

## 2013-07-16 DIAGNOSIS — R29898 Other symptoms and signs involving the musculoskeletal system: Secondary | ICD-10-CM | POA: Diagnosis present

## 2013-07-16 DIAGNOSIS — D62 Acute posthemorrhagic anemia: Secondary | ICD-10-CM | POA: Diagnosis present

## 2013-07-16 DIAGNOSIS — S72002A Fracture of unspecified part of neck of left femur, initial encounter for closed fracture: Secondary | ICD-10-CM | POA: Diagnosis present

## 2013-07-16 DIAGNOSIS — E860 Dehydration: Secondary | ICD-10-CM | POA: Diagnosis present

## 2013-07-16 DIAGNOSIS — E119 Type 2 diabetes mellitus without complications: Secondary | ICD-10-CM | POA: Diagnosis present

## 2013-07-16 DIAGNOSIS — Z66 Do not resuscitate: Secondary | ICD-10-CM | POA: Diagnosis present

## 2013-07-16 DIAGNOSIS — E785 Hyperlipidemia, unspecified: Secondary | ICD-10-CM

## 2013-07-16 DIAGNOSIS — Z8249 Family history of ischemic heart disease and other diseases of the circulatory system: Secondary | ICD-10-CM

## 2013-07-16 DIAGNOSIS — S72009A Fracture of unspecified part of neck of unspecified femur, initial encounter for closed fracture: Secondary | ICD-10-CM | POA: Diagnosis present

## 2013-07-16 DIAGNOSIS — Z86011 Personal history of benign neoplasm of the brain: Secondary | ICD-10-CM

## 2013-07-16 DIAGNOSIS — R1310 Dysphagia, unspecified: Secondary | ICD-10-CM

## 2013-07-16 HISTORY — DX: Sialoadenitis, unspecified: K11.20

## 2013-07-16 LAB — BASIC METABOLIC PANEL
BUN: 36 mg/dL — ABNORMAL HIGH (ref 6–23)
CALCIUM: 10 mg/dL (ref 8.4–10.5)
CO2: 28 mEq/L (ref 19–32)
CREATININE: 2.09 mg/dL — AB (ref 0.50–1.35)
Chloride: 102 mEq/L (ref 96–112)
GFR calc non Af Amer: 28 mL/min — ABNORMAL LOW (ref >90)
GFR, EST AFRICAN AMERICAN: 32 mL/min — AB (ref >90)
Glucose, Bld: 105 mg/dL — ABNORMAL HIGH (ref 70–99)
Potassium: 4.5 mEq/L (ref 3.7–5.3)
Sodium: 140 mEq/L (ref 137–147)

## 2013-07-16 LAB — CBC WITH DIFFERENTIAL/PLATELET
BASOS ABS: 0 10*3/uL (ref 0.0–0.1)
Basophils Relative: 0 % (ref 0–1)
Eosinophils Absolute: 0.1 10*3/uL (ref 0.0–0.7)
Eosinophils Relative: 1 % (ref 0–5)
HCT: 34 % — ABNORMAL LOW (ref 39.0–52.0)
Hemoglobin: 11.6 g/dL — ABNORMAL LOW (ref 13.0–17.0)
LYMPHS ABS: 1.7 10*3/uL (ref 0.7–4.0)
LYMPHS PCT: 32 % (ref 12–46)
MCH: 31.6 pg (ref 26.0–34.0)
MCHC: 34.1 g/dL (ref 30.0–36.0)
MCV: 92.6 fL (ref 78.0–100.0)
Monocytes Absolute: 0.4 10*3/uL (ref 0.1–1.0)
Monocytes Relative: 8 % (ref 3–12)
NEUTROS ABS: 3.2 10*3/uL (ref 1.7–7.7)
NEUTROS PCT: 58 % (ref 43–77)
PLATELETS: 138 10*3/uL — AB (ref 150–400)
RBC: 3.67 MIL/uL — AB (ref 4.22–5.81)
RDW: 13.2 % (ref 11.5–15.5)
WBC: 5.4 10*3/uL (ref 4.0–10.5)

## 2013-07-16 LAB — URINALYSIS, ROUTINE W REFLEX MICROSCOPIC
Bilirubin Urine: NEGATIVE
Glucose, UA: NEGATIVE mg/dL
Hgb urine dipstick: NEGATIVE
Ketones, ur: NEGATIVE mg/dL
LEUKOCYTES UA: NEGATIVE
NITRITE: NEGATIVE
PROTEIN: 30 mg/dL — AB
SPECIFIC GRAVITY, URINE: 1.017 (ref 1.005–1.030)
UROBILINOGEN UA: 0.2 mg/dL (ref 0.0–1.0)
pH: 6 (ref 5.0–8.0)

## 2013-07-16 LAB — PROTIME-INR
INR: 1.08 (ref 0.00–1.49)
PROTHROMBIN TIME: 13.8 s (ref 11.6–15.2)

## 2013-07-16 LAB — URINE MICROSCOPIC-ADD ON

## 2013-07-16 LAB — POCT I-STAT TROPONIN I: TROPONIN I, POC: 0.01 ng/mL (ref 0.00–0.08)

## 2013-07-16 MED ORDER — LEVETIRACETAM 500 MG PO TABS
500.0000 mg | ORAL_TABLET | Freq: Two times a day (BID) | ORAL | Status: DC
  Administered 2013-07-17 – 2013-07-21 (×9): 500 mg via ORAL
  Filled 2013-07-16 (×12): qty 1

## 2013-07-16 MED ORDER — HEPARIN SODIUM (PORCINE) 5000 UNIT/ML IJ SOLN
5000.0000 [IU] | Freq: Three times a day (TID) | INTRAMUSCULAR | Status: DC
  Filled 2013-07-16 (×4): qty 1

## 2013-07-16 MED ORDER — AMLODIPINE BESYLATE 2.5 MG PO TABS
2.5000 mg | ORAL_TABLET | Freq: Every day | ORAL | Status: DC
  Administered 2013-07-18 – 2013-07-21 (×4): 2.5 mg via ORAL
  Filled 2013-07-16 (×5): qty 1

## 2013-07-16 MED ORDER — LORAZEPAM 1 MG PO TABS: 1.0000 mg | ORAL_TABLET | Freq: Four times a day (QID) | ORAL | Status: DC | PRN

## 2013-07-16 MED ORDER — FERROUS SULFATE 325 (65 FE) MG PO TABS
325.0000 mg | ORAL_TABLET | Freq: Every day | ORAL | Status: DC
  Filled 2013-07-16 (×2): qty 1

## 2013-07-16 MED ORDER — FINASTERIDE 5 MG PO TABS
5.0000 mg | ORAL_TABLET | Freq: Every day | ORAL | Status: DC
  Administered 2013-07-18 – 2013-07-21 (×4): 5 mg via ORAL
  Filled 2013-07-16 (×5): qty 1

## 2013-07-16 MED ORDER — ASPIRIN EC 81 MG PO TBEC
81.0000 mg | DELAYED_RELEASE_TABLET | Freq: Every day | ORAL | Status: DC
  Filled 2013-07-16: qty 1

## 2013-07-16 MED ORDER — ATENOLOL 50 MG PO TABS
50.0000 mg | ORAL_TABLET | Freq: Every day | ORAL | Status: DC
  Administered 2013-07-18 – 2013-07-20 (×3): 50 mg via ORAL
  Filled 2013-07-16 (×5): qty 1

## 2013-07-16 MED ORDER — ONDANSETRON HCL 4 MG/2ML IJ SOLN: 4.0000 mg | Freq: Three times a day (TID) | INTRAMUSCULAR | Status: DC | PRN

## 2013-07-16 MED ORDER — SODIUM CHLORIDE 0.9 % IV SOLN
INTRAVENOUS | Status: DC
  Administered 2013-07-17: 01:00:00 via INTRAVENOUS

## 2013-07-16 MED ORDER — SIMVASTATIN 20 MG PO TABS
20.0000 mg | ORAL_TABLET | Freq: Every evening | ORAL | Status: DC
  Administered 2013-07-18 – 2013-07-20 (×3): 20 mg via ORAL
  Filled 2013-07-16 (×6): qty 1

## 2013-07-16 MED ORDER — MORPHINE SULFATE 2 MG/ML IJ SOLN: 2.0000 mg | INTRAMUSCULAR | Status: DC | PRN

## 2013-07-16 MED ORDER — ADULT MULTIVITAMIN W/MINERALS CH
1.0000 | ORAL_TABLET | Freq: Every day | ORAL | Status: DC
  Administered 2013-07-18 – 2013-07-21 (×4): 1 via ORAL
  Filled 2013-07-16 (×5): qty 1

## 2013-07-16 MED ORDER — HYDROCODONE-ACETAMINOPHEN 5-325 MG PO TABS
1.0000 | ORAL_TABLET | ORAL | Status: DC | PRN
  Administered 2013-07-17: 1 via ORAL
  Filled 2013-07-16: qty 1

## 2013-07-16 MED ORDER — FENTANYL CITRATE 0.05 MG/ML IJ SOLN
50.0000 ug | Freq: Once | INTRAMUSCULAR | Status: AC
  Administered 2013-07-16: 50 ug via INTRAVENOUS
  Filled 2013-07-16: qty 2

## 2013-07-16 MED ORDER — SODIUM CHLORIDE 0.9 % IJ SOLN: 3.0000 mL | Freq: Two times a day (BID) | INTRAMUSCULAR | Status: DC

## 2013-07-16 MED ORDER — PANTOPRAZOLE SODIUM 40 MG PO TBEC
40.0000 mg | DELAYED_RELEASE_TABLET | Freq: Every day | ORAL | Status: DC
  Administered 2013-07-18 – 2013-07-21 (×4): 40 mg via ORAL
  Filled 2013-07-16 (×5): qty 1

## 2013-07-16 MED ORDER — DIVALPROEX SODIUM 125 MG PO CPSP
250.0000 mg | ORAL_CAPSULE | ORAL | Status: DC
  Administered 2013-07-17 – 2013-07-21 (×12): 250 mg via ORAL
  Filled 2013-07-16 (×16): qty 2

## 2013-07-16 MED ORDER — CHOLECALCIFEROL 10 MCG (400 UNIT) PO TABS
400.0000 [IU] | ORAL_TABLET | Freq: Every day | ORAL | Status: DC
  Administered 2013-07-18 – 2013-07-21 (×4): 400 [IU] via ORAL
  Filled 2013-07-16 (×5): qty 1

## 2013-07-16 MED ORDER — MEMANTINE HCL ER 7 MG PO CP24
28.0000 mg | ORAL_CAPSULE | Freq: Every day | ORAL | Status: DC
  Administered 2013-07-18 – 2013-07-21 (×4): 28 mg via ORAL
  Filled 2013-07-16 (×5): qty 4

## 2013-07-16 NOTE — ED Notes (Signed)
Bed: WA09 Expected date: 07/16/13 Expected time: 8:32 PM Means of arrival: Ambulance Comments: Fall, no deformity

## 2013-07-16 NOTE — ED Notes (Signed)
Per EMS,pt had fall tonight. C/o Lf leg pain. Denies neck or back pain. Pain with movement, more comfortable flexed.

## 2013-07-16 NOTE — ED Provider Notes (Signed)
CSN: [id number]     Arrival date & time 07/16/13  2045 History   First MD Initiated Contact with Patient 07/16/13 2106     Chief Complaint  Patient presents with  . Leg Pain  . Fall   (Consider location/radiation/quality/duration/timing/severity/associated sxs/prior Treatment) HPI Comments: Patient is an 80 year old male with history of Hypertension, hyperlipidemia, chronic kidney disease, diabetes, dementia who presents today after a fall. The fall was witnessed by an employee at [facility] [place]. His family member reports that he was walking with his walker, but then got caught up in the walker and tripped. They had told him to wait for assistance, however the patient did not listen and tried to get up anyway. He landed on his left hip and left knee. He has abrasions to his knees bilaterally. His daughter believes his last tetanus shot was 2 years ago. He currently complains of hip and knee pain on his left side. Flexing his knees helps with the pain. His daughter believes he is on a blood thinner, but on med review it appears he only takes aspirin. Patient did eat dinner this evening, confirmed by the staff at [facility].   The history is provided by the patient. No language interpreter was used.    Past Medical History  Diagnosis Date  . Hypertension   . Hyperlipidemia   . Seizure disorder   . CKD (chronic kidney disease), stage III   . Altered mental state 08/31/2012    "came into hospital a little disoriented; it's gotten worse; this is not normal" (08/31/2012)  . Type II diabetes mellitus   . Chronic lower back pain   . Frequent falls   . brain tumor dx'd 2002    surg only; refused xrt last yr per family member  . DDD (degenerative disc disease), lumbar   . Dementia   . Seizure disorder     with left leg weakness (Todd's phenomenon)  . Dysphagia   . Assistance needed for mobility     walks with walker  . Sialoadenitis    Past Surgical History  Procedure Laterality Date  .  Brain tumor excision    . 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 Hypertension Daughter    History  Substance Use Topics  . Smoking status: Never Smoker   . Smokeless tobacco: Never Used  . Alcohol Use: No    Review of Systems  Unable to perform ROS: Dementia    Allergies  Review of patient's allergies indicates no known allergies.  Home Medications   Current Outpatient Rx  Name  Route  Sig  Dispense  Refill  . amLODipine (NORVASC) 2.5 MG tablet   Oral   Take 2.5 mg by mouth daily.         Marland Kitchen aspirin EC 81 MG tablet   Oral   Take 81 mg by mouth daily.         Marland Kitchen atenolol (TENORMIN) 50 MG tablet   Oral   Take 50 mg by mouth daily.         . cholecalciferol (VITAMIN D) 400 UNITS TABS tablet   Oral   Take 400 Units by mouth daily.         . divalproex (DEPAKOTE SPRINKLE) 125 MG capsule   Oral   Take 125-250 mg by mouth 4 (four) times daily. Take 2 capsules by mouth at 8AM, then Take 1 capsule by mouth at noon, 4pm, and 8pm         .  ferrous sulfate 325 (65 FE) MG tablet   Oral   Take 325 mg by mouth daily with breakfast.         . finasteride (PROSCAR) 5 MG tablet   Oral   Take 5 mg by mouth daily.         Marland Kitchen levETIRAcetam (KEPPRA) 500 MG tablet   Oral   Take 500 mg by mouth every 12 (twelve) hours.         Marland Kitchen LORazepam (ATIVAN) 1 MG tablet   Oral   Take 1 mg by mouth every 6 (six) hours as needed for anxiety.          . Memantine HCl ER (NAMENDA XR) 28 MG CP24   Oral   Take 1 capsule by mouth daily.         . Multiple Vitamin (MULTIVITAMIN WITH MINERALS) TABS tablet   Oral   Take 1 tablet by mouth daily.         . pantoprazole (PROTONIX) 40 MG tablet   Oral   Take 40 mg by mouth daily.         . simvastatin (ZOCOR) 20 MG tablet   Oral   Take 20 mg by mouth every evening.         Marland Kitchen EXPIRED: Memantine HCl ER (NAMENDA XR) 14 MG CP24   Oral   Take 1 capsule by mouth daily. For 7  days          BP 164/58  Pulse 59  Temp(Src) 98.1 F (36.7 C) (Oral)  Resp 16  Ht 6\' 2"  (1.88 m)  Wt 198 lb (89.812 kg)  BMI 25.41 kg/m2  SpO2 100% Physical Exam  Nursing note and vitals reviewed. Constitutional: He is oriented to person, place, and time. He appears well-developed and well-nourished. No distress.  HENT:  Head: Normocephalic and atraumatic.  Right Ear: External ear normal.  Left Ear: External ear normal.  Nose: Nose normal.  Eyes: Conjunctivae are normal.  Neck: Normal range of motion. No tracheal deviation present.  Cardiovascular: Normal rate, regular rhythm, normal heart sounds, intact distal pulses and normal pulses.   Pulses:      Dorsalis pedis pulses are 2+ on the right side, and 2+ on the left side.  Pulmonary/Chest: Effort normal and breath sounds normal. No stridor.  Abdominal: Soft. He exhibits no distension. There is no tenderness.  Musculoskeletal: Normal range of motion.       Legs: Pt sitting with hips and knees flexed. Pain on left side when leg is straightened.   Neurological: He is alert and oriented to person, place, and time.  Skin: Skin is warm and dry. He is not diaphoretic.  Psychiatric: He has a normal mood and affect. His behavior is normal.    ED Course  Procedures (including critical care time) Labs Review Labs Reviewed  BASIC METABOLIC PANEL - Abnormal; Notable for the following:    Glucose, Bld 105 (*)    BUN 36 (*)    Creatinine, Ser 2.09 (*)    GFR calc non Af Amer 28 (*)    GFR calc Af Amer 32 (*)    All other components within normal limits  CBC WITH DIFFERENTIAL - Abnormal; Notable for the following:    RBC 3.67 (*)    Hemoglobin 11.6 (*)    HCT 34.0 (*)    Platelets 138 (*)    All other components within normal limits  URINALYSIS, ROUTINE W REFLEX MICROSCOPIC - Abnormal; Notable for  the following:    Protein, ur 30 (*)    All other components within normal limits  URINE MICROSCOPIC-ADD ON - Abnormal; Notable  for the following:    Casts HYALINE CASTS (*)    All other components within normal limits  SURGICAL PCR SCREEN  PROTIME-INR  GLUCOSE, CAPILLARY  COMPREHENSIVE METABOLIC PANEL  CBC  HEMOGLOBIN A1C  POCT I-STAT TROPONIN I  TYPE AND SCREEN  ABO/RH   Imaging Review Dg Hip Complete Left  07/16/2013   CLINICAL DATA:  Left hip pain after fall.  EXAM: LEFT HIP - COMPLETE 2+ VIEW  COMPARISON:  None available for comparison at time of study interpretation.  FINDINGS: Left subcapital femur fracture with varus angulation distal bony fragments, impaction. Femoral head is well formed and located. No dislocation.  Bone mineral density is decreased without destructive bony lesions. Periarticular soft tissue planes are nonsuspicious.  IMPRESSION: Angulated impacted left subcapital femur fracture without dislocation.   Electronically Signed   By: Elon Alas   On: 07/16/2013 22:36   Dg Knee 1-2 Views Left  07/16/2013   CLINICAL DATA:  Fall with left knee pain.  EXAM: LEFT KNEE - 1-2 VIEW  COMPARISON:  None.  FINDINGS: Oblique images were not obtained due to limited patient mobility because of a hip fracture.  Bones are osteopenic. There is no evidence of acute fracture or dislocation. No joint effusion is seen. Patellar enthesophytes are present. Medial and lateral compartment joint spaces are maintained. Vascular calcifications are present.  IMPRESSION: No evidence of acute osseous abnormality involving the left knee.   Electronically Signed   By: Logan Bores   On: 07/16/2013 22:38     11:14 PM Discussed case with Dr. Onnie Graham. Plan to operate tomorrow. NPO after midnight. Will admit to medicine.   MDM  No diagnosis found.  Patient presents to ED with angulated impacted left subcapital femur fracture without dislocation. Pain controlled in ED with fentanyl. Pt ate dinner tonight. Compartment soft, neurovascularly intact. Plan for surgery tomorrow morning. Patient is admitted to medicine. Vital signs  stable for transfer to floor. Patient / Family / Caregiver informed of clinical course, understand medical decision-making process, and agree with plan.     Elwyn Lade, PA-C 07/17/13 0230

## 2013-07-16 NOTE — ED Notes (Signed)
Pt states he has pain in left hip. Has discomfort to extend leg.

## 2013-07-16 NOTE — H&P (Signed)
Hospitalist Admission History and Physical  Patient name: Kurt [name] Medical record number: [id number] Date of birth: 1929/04/19 Age: 80 y.o. Gender: male  Primary Care Provider: [name], MD  Chief Complaint: mechanical fall, femoral fracture   History of Present Illness:This is a 80 y.o. year old male with significant past medical history of dementia, seizure disorder with left leg weakness, hypertension, diabetes, hyperlipidemia presenting with mechanical fall and left femoral fracture. Patient is a resident of [facility] assisted [facility] facility. Patient states that he was trying to wash dishes and accidentally fell landing on his left side. Patient has severe left-sided pain from this point. Patient subsequently presented to the ER with worsening pain. Left hip x-ray obtained showing left subcapital femoral fracture. Orthopedics consult it. They will operate in the morning. Recommending medical admission with orthopedic consultation. Patient denies any chest pain, shortness of breath, weakness, dizziness prior to onset. No direct head trauma. Denies any recent seizure activity. Patient states blood sugars have been well controlled.  Patient Active Problem List   Diagnosis Date Noted  . Hip fracture 07/16/2013  . Femoral fracture 07/16/2013  . Hypoglycemia 02/23/2013  . Acute encephalopathy 02/22/2013  . Dementia 10/02/2012  . Dehydration 09/28/2012  . Dysphagia 09/28/2012  . Altered mental status 09/27/2012  . ARF (acute renal failure) 09/27/2012  . Acute Submandibular sialoadenitis 09/27/2012  . Hypernatremia 09/27/2012  . Seizure disorder, with left leg weakness (Todds phenomenon) 08/30/2012  . Hypertension   . Diabetes mellitus without complication   . Hyperlipidemia   . CKD (chronic kidney disease), stage III    Past Medical History: Past Medical History  Diagnosis Date  . Hypertension   . Hyperlipidemia   . Seizure disorder   . CKD (chronic kidney disease), stage  III   . Altered mental state 08/31/2012    "came into hospital a little disoriented; it's gotten worse; this is not normal" (08/31/2012)  . Type II diabetes mellitus   . Chronic lower back pain   . Frequent falls   . brain tumor dx'd 2002    surg only; refused xrt last yr per family member  . DDD (degenerative disc disease), lumbar   . Dementia   . Seizure disorder     with left leg weakness (Todd's phenomenon)  . Dysphagia   . Assistance needed for mobility     walks with walker  . Sialoadenitis     Past Surgical History: Past Surgical History  Procedure Laterality Date  . Brain tumor excision    . 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 Marital Status: Married    Spouse Name: N/A    Number of Children: N/A  . Years of Education: N/A   Social History Main Topics  . Smoking status: Never Smoker   . Smokeless tobacco: Never Used  . Alcohol Use: No  . Drug Use: No  . Sexual Activity: Not on file   Other Topics Concern  . Not on file   Social History Narrative  . No narrative on file    Family History: Family History  Problem Relation Age of Onset  . Hypertension Daughter     Allergies: No Known Allergies  Current Facility-Administered Medications  Medication Dose Route Frequency Provider Last Rate Last Dose  . [START ON 07/17/2013] 0.9 %  sodium chloride infusion   Intravenous Continuous Shanda Howells, MD      . Derrill Memo ON 07/17/2013] amLODipine (NORVASC) tablet 2.5  mg  2.5 mg Oral Daily Shanda Howells, MD      . Derrill Memo ON 07/17/2013] aspirin EC tablet 81 mg  81 mg Oral Daily Shanda Howells, MD      . Derrill Memo ON 07/17/2013] atenolol (TENORMIN) tablet 50 mg  50 mg Oral Daily Shanda Howells, MD      . Derrill Memo ON 07/17/2013] cholecalciferol (VITAMIN D) tablet 400 Units  400 Units Oral Daily Shanda Howells, MD      . Derrill Memo ON 07/17/2013] divalproex (DEPAKOTE SPRINKLE) capsule 125-250 mg  125-250 mg Oral QID Shanda Howells, MD      .  Derrill Memo ON 07/17/2013] ferrous sulfate tablet 325 mg  325 mg Oral Q breakfast Shanda Howells, MD      . Derrill Memo ON 07/17/2013] finasteride (PROSCAR) tablet 5 mg  5 mg Oral Daily Shanda Howells, MD      . Derrill Memo ON 07/17/2013] heparin injection 5,000 Units  5,000 Units Subcutaneous Q8H Shanda Howells, MD      . HYDROcodone-acetaminophen (NORCO/VICODIN) 5-325 MG per tablet 1-2 tablet  1-2 tablet Oral Q4H PRN Elwyn Lade, PA-C      . Derrill Memo ON 07/17/2013] levETIRAcetam (KEPPRA) tablet 500 mg  500 mg Oral Q12H Shanda Howells, MD      . LORazepam (ATIVAN) tablet 1 mg  1 mg Oral Q6H PRN Shanda Howells, MD      . Derrill Memo ON 07/17/2013] Memantine HCl ER CP24 28 mg  1 capsule Oral Daily Shanda Howells, MD      . morphine 2 MG/ML injection 2-4 mg  2-4 mg Intravenous Q2H PRN Shanda Howells, MD      . Derrill Memo ON 07/17/2013] multivitamin with minerals tablet 1 tablet  1 tablet Oral Daily Shanda Howells, MD      . ondansetron [facility] injection 4 mg  4 mg Intravenous Q8H PRN Elwyn Lade, PA-C      . Derrill Memo ON 07/17/2013] pantoprazole (PROTONIX) EC tablet 40 mg  40 mg Oral Daily Shanda Howells, MD      . Derrill Memo ON 07/17/2013] simvastatin (ZOCOR) tablet 20 mg  20 mg Oral QPM Shanda Howells, MD      . Derrill Memo ON 07/17/2013] sodium chloride 0.9 % injection 3 mL  3 mL Intravenous Q12H Shanda Howells, MD       Current Outpatient Prescriptions  Medication Sig Dispense Refill  . amLODipine (NORVASC) 2.5 MG tablet Take 2.5 mg by mouth daily.      Marland Kitchen aspirin EC 81 MG tablet Take 81 mg by mouth daily.      Marland Kitchen atenolol (TENORMIN) 50 MG tablet Take 50 mg by mouth daily.      . cholecalciferol (VITAMIN D) 400 UNITS TABS tablet Take 400 Units by mouth daily.      . divalproex (DEPAKOTE SPRINKLE) 125 MG capsule Take 125-250 mg by mouth 4 (four) times daily. Take 2 capsules by mouth at 8AM, then Take 1 capsule by mouth at noon, 4pm, and 8pm      . ferrous sulfate 325 (65 FE) MG tablet Take 325 mg by mouth daily with breakfast.      . finasteride  (PROSCAR) 5 MG tablet Take 5 mg by mouth daily.      Marland Kitchen levETIRAcetam (KEPPRA) 500 MG tablet Take 500 mg by mouth every 12 (twelve) hours.      Marland Kitchen LORazepam (ATIVAN) 1 MG tablet Take 1 mg by mouth every 6 (six) hours as needed for anxiety.       . Memantine HCl  ER (NAMENDA XR) 28 MG CP24 Take 1 capsule by mouth daily.      . Multiple Vitamin (MULTIVITAMIN WITH MINERALS) TABS tablet Take 1 tablet by mouth daily.      . pantoprazole (PROTONIX) 40 MG tablet Take 40 mg by mouth daily.      . simvastatin (ZOCOR) 20 MG tablet Take 20 mg by mouth every evening.      . Memantine HCl ER (NAMENDA XR) 14 MG CP24 Take 1 capsule by mouth daily. For 7 days       Review Of Systems: 12 point ROS negative except as noted above in HPI.  Physical Exam: Filed Vitals:   07/16/13 2356  BP: 158/65  Pulse: 68  Temp:   Resp: 16    General: alert and cooperative HEENT: PERRLA and extra ocular movement intact Heart: S1, S2 normal, no murmur, rub or gallop, regular rate and rhythm Lungs: clear to auscultation, no wheezes or rales and unlabored breathing Abdomen: abdomen is soft without significant tenderness, masses, organomegaly or guarding Extremities: + L hip TTP, neurovasculalry intact distally Skin:no rashes, no ecchymoses Neurology: normal without focal findings  Labs and Imaging: Lab Results  Component Value Date/Time   NA 140 07/16/2013  9:50 PM   K 4.5 07/16/2013  9:50 PM   CL 102 07/16/2013  9:50 PM   CO2 28 07/16/2013  9:50 PM   BUN 36* 07/16/2013  9:50 PM   CREATININE 2.09* 07/16/2013  9:50 PM   GLUCOSE 105* 07/16/2013  9:50 PM   Lab Results  Component Value Date   WBC 5.4 07/16/2013   HGB 11.6* 07/16/2013   HCT 34.0* 07/16/2013   MCV 92.6 07/16/2013   PLT 138* 07/16/2013   Dg Hip Complete Left  07/16/2013   CLINICAL DATA:  Left hip pain after fall.  EXAM: LEFT HIP - COMPLETE 2+ VIEW  COMPARISON:  None available for comparison at time of study interpretation.  FINDINGS: Left subcapital femur fracture with  varus angulation distal bony fragments, impaction. Femoral head is well formed and located. No dislocation.  Bone mineral density is decreased without destructive bony lesions. Periarticular soft tissue planes are nonsuspicious.  IMPRESSION: Angulated impacted left subcapital femur fracture without dislocation.   Electronically Signed   By: Elon Alas   On: 07/16/2013 22:36   Dg Knee 1-2 Views Left  07/16/2013   CLINICAL DATA:  Fall with left knee pain.  EXAM: LEFT KNEE - 1-2 VIEW  COMPARISON:  None.  FINDINGS: Oblique images were not obtained due to limited patient mobility because of a hip fracture.  Bones are osteopenic. There is no evidence of acute fracture or dislocation. No joint effusion is seen. Patellar enthesophytes are present. Medial and lateral compartment joint spaces are maintained. Vascular calcifications are present.  IMPRESSION: No evidence of acute osseous abnormality involving the left knee.   Electronically Signed   By: Logan Bores   On: 07/16/2013 22:38     Assessment and Plan: DREAM NODAL is a 80 y.o. year old male presenting with mechanical fall, L femoral fracture  Femoral fracture: Dr. Onnie Graham w/ [facility] consulted. OR in am per report. NPO after midnight. Treatment per ortho recs. Morphine prn for pain control in the interim.   HTN: hemodynamically stable. Continue home meds.   Seizure d/o: clinically stable. Continue home meds.   DM: SSI. A1C.   Anemia: baseline hgb 11-12. Near baseline. Likely ACD. Continue to follow.   CKD: Stage 3-4 CKD. At baseline.  Gentle hydration pending OR. Follow closely.   Dementia: continue namenda.   FEN/GI: NPO after midnight. PPI Prophylaxis: subq heparin  Disposition: pending further evaluation  Code Status:DNR       Shanda Howells MD  Pager: [phone]

## 2013-07-17 ENCOUNTER — Inpatient Hospital Stay (HOSPITAL_COMMUNITY): Payer: Medicare Other

## 2013-07-17 ENCOUNTER — Encounter (HOSPITAL_COMMUNITY): Payer: Medicare Other | Admitting: Anesthesiology

## 2013-07-17 ENCOUNTER — Other Ambulatory Visit (HOSPITAL_COMMUNITY): Payer: Medicare Other

## 2013-07-17 ENCOUNTER — Other Ambulatory Visit: Payer: Self-pay | Admitting: Surgical

## 2013-07-17 ENCOUNTER — Encounter (HOSPITAL_COMMUNITY): Payer: Self-pay | Admitting: General Practice

## 2013-07-17 ENCOUNTER — Inpatient Hospital Stay (HOSPITAL_COMMUNITY): Payer: Medicare Other | Admitting: Anesthesiology

## 2013-07-17 ENCOUNTER — Encounter (HOSPITAL_COMMUNITY)
Admission: EM | Disposition: A | Discharge status: Skilled Nursing Facility | Payer: Self-pay | Source: Home / Self Care | Attending: Orthopedic Surgery

## 2013-07-17 DIAGNOSIS — N183 Chronic kidney disease, stage 3 unspecified: Secondary | ICD-10-CM

## 2013-07-17 DIAGNOSIS — F0390 Unspecified dementia without behavioral disturbance: Secondary | ICD-10-CM

## 2013-07-17 DIAGNOSIS — S72002A Fracture of unspecified part of neck of left femur, initial encounter for closed fracture: Secondary | ICD-10-CM | POA: Diagnosis present

## 2013-07-17 DIAGNOSIS — S72009A Fracture of unspecified part of neck of unspecified femur, initial encounter for closed fracture: Secondary | ICD-10-CM

## 2013-07-17 DIAGNOSIS — E119 Type 2 diabetes mellitus without complications: Secondary | ICD-10-CM

## 2013-07-17 HISTORY — PX: HIP ARTHROPLASTY: SHX981

## 2013-07-17 LAB — COMPREHENSIVE METABOLIC PANEL
ALBUMIN: 3 g/dL — AB (ref 3.5–5.2)
ALK PHOS: 62 U/L (ref 39–117)
ALT: 15 U/L (ref 0–53)
AST: 13 U/L (ref 0–37)
BILIRUBIN TOTAL: 0.3 mg/dL (ref 0.3–1.2)
BUN: 34 mg/dL — ABNORMAL HIGH (ref 6–23)
CHLORIDE: 102 meq/L (ref 96–112)
CO2: 26 mEq/L (ref 19–32)
CREATININE: 1.88 mg/dL — AB (ref 0.50–1.35)
Calcium: 9.4 mg/dL (ref 8.4–10.5)
GFR calc Af Amer: 37 mL/min — ABNORMAL LOW (ref >90)
GFR calc non Af Amer: 32 mL/min — ABNORMAL LOW (ref >90)
Glucose, Bld: 122 mg/dL — ABNORMAL HIGH (ref 70–99)
POTASSIUM: 4.7 meq/L (ref 3.7–5.3)
SODIUM: 137 meq/L (ref 137–147)
Total Protein: 6.4 g/dL (ref 6.0–8.3)

## 2013-07-17 LAB — GLUCOSE, CAPILLARY
GLUCOSE-CAPILLARY: 109 mg/dL — AB (ref 70–99)
GLUCOSE-CAPILLARY: 149 mg/dL — AB (ref 70–99)
Glucose-Capillary: 106 mg/dL — ABNORMAL HIGH (ref 70–99)
Glucose-Capillary: 141 mg/dL — ABNORMAL HIGH (ref 70–99)
Glucose-Capillary: 99 mg/dL (ref 70–99)

## 2013-07-17 LAB — CBC
HCT: 31.8 % — ABNORMAL LOW (ref 39.0–52.0)
Hemoglobin: 10.7 g/dL — ABNORMAL LOW (ref 13.0–17.0)
MCH: 30.7 pg (ref 26.0–34.0)
MCHC: 33.6 g/dL (ref 30.0–36.0)
MCV: 91.4 fL (ref 78.0–100.0)
PLATELETS: 138 10*3/uL — AB (ref 150–400)
RBC: 3.48 MIL/uL — AB (ref 4.22–5.81)
RDW: 13.2 % (ref 11.5–15.5)
WBC: 9.4 10*3/uL (ref 4.0–10.5)

## 2013-07-17 LAB — HEMOGLOBIN A1C
Hgb A1c MFr Bld: 6.1 % — ABNORMAL HIGH (ref <5.7)
Mean Plasma Glucose: 128 mg/dL — ABNORMAL HIGH (ref <117)

## 2013-07-17 LAB — SURGICAL PCR SCREEN
MRSA, PCR: NEGATIVE
Staphylococcus aureus: NEGATIVE

## 2013-07-17 LAB — PREPARE RBC (CROSSMATCH)

## 2013-07-17 LAB — ABO/RH: ABO/RH(D): A POS

## 2013-07-17 SURGERY — HEMIARTHROPLASTY, HIP, DIRECT ANTERIOR APPROACH, FOR FRACTURE
Anesthesia: General | Site: Hip | Laterality: Left

## 2013-07-17 MED ORDER — METHOCARBAMOL 100 MG/ML IJ SOLN
500.0000 mg | Freq: Four times a day (QID) | INTRAVENOUS | Status: DC | PRN
  Filled 2013-07-17: qty 5

## 2013-07-17 MED ORDER — DEXAMETHASONE SODIUM PHOSPHATE 10 MG/ML IJ SOLN
INTRAMUSCULAR | Status: DC | PRN
  Administered 2013-07-17: 10 mg via INTRAVENOUS

## 2013-07-17 MED ORDER — CISATRACURIUM BESYLATE 20 MG/10ML IV SOLN
INTRAVENOUS | Status: AC
  Filled 2013-07-17: qty 10

## 2013-07-17 MED ORDER — LACTATED RINGERS IV SOLN: INTRAVENOUS | Status: DC

## 2013-07-17 MED ORDER — BIOTENE DRY MOUTH MT LIQD
15.0000 mL | Freq: Two times a day (BID) | OROMUCOSAL | Status: DC
  Administered 2013-07-17: 15 mL via OROMUCOSAL

## 2013-07-17 MED ORDER — HYDROMORPHONE HCL PF 1 MG/ML IJ SOLN
0.2500 mg | INTRAMUSCULAR | Status: DC | PRN
  Administered 2013-07-17 (×2): 0.5 mg via INTRAVENOUS

## 2013-07-17 MED ORDER — HYDROMORPHONE HCL PF 1 MG/ML IJ SOLN: 0.1000 mg | INTRAMUSCULAR | Status: DC | PRN

## 2013-07-17 MED ORDER — SUCCINYLCHOLINE CHLORIDE 20 MG/ML IJ SOLN
INTRAMUSCULAR | Status: DC | PRN
  Administered 2013-07-17: 100 mg via INTRAVENOUS

## 2013-07-17 MED ORDER — ONDANSETRON HCL 4 MG/2ML IJ SOLN
INTRAMUSCULAR | Status: DC | PRN
  Administered 2013-07-17: 4 mg via INTRAVENOUS

## 2013-07-17 MED ORDER — CEFAZOLIN SODIUM-DEXTROSE 2-3 GM-% IV SOLR
INTRAVENOUS | Status: DC | PRN
  Administered 2013-07-17: 2 g via INTRAVENOUS

## 2013-07-17 MED ORDER — PROPOFOL 10 MG/ML IV BOLUS
INTRAVENOUS | Status: AC
  Filled 2013-07-17: qty 20

## 2013-07-17 MED ORDER — ONDANSETRON HCL 4 MG/2ML IJ SOLN: 4.0000 mg | Freq: Four times a day (QID) | INTRAMUSCULAR | Status: DC | PRN

## 2013-07-17 MED ORDER — METHOCARBAMOL 500 MG PO TABS: 500.0000 mg | ORAL_TABLET | Freq: Four times a day (QID) | ORAL | Status: DC | PRN

## 2013-07-17 MED ORDER — PROMETHAZINE HCL 25 MG/ML IJ SOLN: 6.2500 mg | INTRAMUSCULAR | Status: DC | PRN

## 2013-07-17 MED ORDER — DEXAMETHASONE SODIUM PHOSPHATE 10 MG/ML IJ SOLN
INTRAMUSCULAR | Status: AC
  Filled 2013-07-17: qty 1

## 2013-07-17 MED ORDER — ONDANSETRON HCL 4 MG PO TABS: 4.0000 mg | ORAL_TABLET | Freq: Four times a day (QID) | ORAL | Status: DC | PRN

## 2013-07-17 MED ORDER — WARFARIN - PHARMACIST DOSING INPATIENT: Freq: Every day | Status: DC

## 2013-07-17 MED ORDER — WARFARIN SODIUM 4 MG PO TABS
4.0000 mg | ORAL_TABLET | Freq: Once | ORAL | Status: AC
  Administered 2013-07-17: 4 mg via ORAL
  Filled 2013-07-17: qty 1

## 2013-07-17 MED ORDER — ACETAMINOPHEN 650 MG RE SUPP: 650.0000 mg | Freq: Four times a day (QID) | RECTAL | Status: DC | PRN

## 2013-07-17 MED ORDER — CEFAZOLIN SODIUM-DEXTROSE 2-3 GM-% IV SOLR
INTRAVENOUS | Status: AC
  Filled 2013-07-17: qty 50

## 2013-07-17 MED ORDER — HYDROMORPHONE HCL PF 1 MG/ML IJ SOLN
INTRAMUSCULAR | Status: AC
  Filled 2013-07-17: qty 1

## 2013-07-17 MED ORDER — DIVALPROEX SODIUM 125 MG PO CPSP
125.0000 mg | ORAL_CAPSULE | ORAL | Status: DC
  Administered 2013-07-18 – 2013-07-21 (×4): 125 mg via ORAL
  Filled 2013-07-17 (×11): qty 1

## 2013-07-17 MED ORDER — PHENOL 1.4 % MT LIQD: 1.0000 | OROMUCOSAL | Status: DC | PRN

## 2013-07-17 MED ORDER — CEFAZOLIN SODIUM-DEXTROSE 2-3 GM-% IV SOLR
2.0000 g | Freq: Four times a day (QID) | INTRAVENOUS | Status: AC
  Administered 2013-07-17 (×2): 2 g via INTRAVENOUS
  Filled 2013-07-17 (×2): qty 50

## 2013-07-17 MED ORDER — GLYCOPYRROLATE 0.2 MG/ML IJ SOLN
INTRAMUSCULAR | Status: AC
  Filled 2013-07-17: qty 3

## 2013-07-17 MED ORDER — HYDROCODONE-ACETAMINOPHEN 5-325 MG PO TABS: 1.0000 | ORAL_TABLET | Freq: Four times a day (QID) | ORAL | Status: DC | PRN

## 2013-07-17 MED ORDER — FENTANYL CITRATE 0.05 MG/ML IJ SOLN
INTRAMUSCULAR | Status: DC | PRN
  Administered 2013-07-17 (×3): 25 ug via INTRAVENOUS
  Administered 2013-07-17 (×2): 50 ug via INTRAVENOUS
  Administered 2013-07-17: 25 ug via INTRAVENOUS

## 2013-07-17 MED ORDER — BUPIVACAINE LIPOSOME 1.3 % IJ SUSP: 20.0000 mL | Freq: Once | INTRAMUSCULAR | Status: AC

## 2013-07-17 MED ORDER — BUPIVACAINE LIPOSOME 1.3 % IJ SUSP
20.0000 mL | Freq: Once | INTRAMUSCULAR | Status: AC
  Administered 2013-07-17: 20 mL
  Filled 2013-07-17: qty 20

## 2013-07-17 MED ORDER — THROMBIN 5000 UNITS EX SOLR
CUTANEOUS | Status: DC | PRN
  Administered 2013-07-17: 5000 [IU] via TOPICAL

## 2013-07-17 MED ORDER — CEFAZOLIN SODIUM-DEXTROSE 2-3 GM-% IV SOLR
2.0000 g | Freq: Once | INTRAVENOUS | Status: DC
  Filled 2013-07-17: qty 50

## 2013-07-17 MED ORDER — SODIUM CHLORIDE 0.9 % IR SOLN
Status: DC | PRN
  Administered 2013-07-17: 11:00:00

## 2013-07-17 MED ORDER — POLYETHYLENE GLYCOL 3350 17 G PO PACK
17.0000 g | PACK | Freq: Every day | ORAL | Status: DC | PRN
Start: 2013-07-17 — End: 2013-07-21

## 2013-07-17 MED ORDER — SODIUM CHLORIDE 0.9 % IJ SOLN
INTRAMUSCULAR | Status: AC
  Filled 2013-07-17: qty 50

## 2013-07-17 MED ORDER — LACTATED RINGERS IV SOLN
INTRAVENOUS | Status: DC | PRN
  Administered 2013-07-17 (×2): via INTRAVENOUS

## 2013-07-17 MED ORDER — SODIUM CHLORIDE 0.9 % IJ SOLN
INTRAMUSCULAR | Status: DC | PRN
  Administered 2013-07-17: 20 mL via INTRAVENOUS

## 2013-07-17 MED ORDER — NEOSTIGMINE METHYLSULFATE 1 MG/ML IJ SOLN
INTRAMUSCULAR | Status: AC
  Filled 2013-07-17: qty 10

## 2013-07-17 MED ORDER — BISACODYL 10 MG RE SUPP: 10.0000 mg | Freq: Every day | RECTAL | Status: DC | PRN

## 2013-07-17 MED ORDER — FLEET ENEMA 7-19 GM/118ML RE ENEM: 1.0000 | ENEMA | Freq: Once | RECTAL | Status: AC | PRN

## 2013-07-17 MED ORDER — THROMBIN 5000 UNITS EX SOLR
CUTANEOUS | Status: AC
  Filled 2013-07-17: qty 5000

## 2013-07-17 MED ORDER — FENTANYL CITRATE 0.05 MG/ML IJ SOLN
INTRAMUSCULAR | Status: AC
  Filled 2013-07-17: qty 2

## 2013-07-17 MED ORDER — KETOROLAC TROMETHAMINE 30 MG/ML IJ SOLN: 15.0000 mg | Freq: Once | INTRAMUSCULAR | Status: DC | PRN

## 2013-07-17 MED ORDER — INSULIN ASPART 100 UNIT/ML ~~LOC~~ SOLN: 0.0000 [IU] | Freq: Three times a day (TID) | SUBCUTANEOUS | Status: DC

## 2013-07-17 MED ORDER — INSULIN ASPART 100 UNIT/ML ~~LOC~~ SOLN
0.0000 [IU] | Freq: Three times a day (TID) | SUBCUTANEOUS | Status: DC
  Administered 2013-07-17 – 2013-07-21 (×5): 2 [IU] via SUBCUTANEOUS

## 2013-07-17 MED ORDER — FERROUS SULFATE 325 (65 FE) MG PO TABS
325.0000 mg | ORAL_TABLET | Freq: Three times a day (TID) | ORAL | Status: DC
  Administered 2013-07-17 – 2013-07-21 (×11): 325 mg via ORAL
  Filled 2013-07-17 (×14): qty 1

## 2013-07-17 MED ORDER — CHLORHEXIDINE GLUCONATE 0.12 % MT SOLN
15.0000 mL | Freq: Two times a day (BID) | OROMUCOSAL | Status: DC
  Filled 2013-07-17 (×3): qty 15

## 2013-07-17 MED ORDER — MENTHOL 3 MG MT LOZG: 1.0000 | LOZENGE | OROMUCOSAL | Status: DC | PRN

## 2013-07-17 MED ORDER — ALUM & MAG HYDROXIDE-SIMETH 200-200-20 MG/5ML PO SUSP: 30.0000 mL | ORAL | Status: DC | PRN

## 2013-07-17 MED ORDER — INSULIN ASPART 100 UNIT/ML ~~LOC~~ SOLN: 0.0000 [IU] | Freq: Every day | SUBCUTANEOUS | Status: DC

## 2013-07-17 MED ORDER — ONDANSETRON HCL 4 MG/2ML IJ SOLN
INTRAMUSCULAR | Status: AC
  Filled 2013-07-17: qty 2

## 2013-07-17 MED ORDER — MEPERIDINE HCL 50 MG/ML IJ SOLN: 6.2500 mg | INTRAMUSCULAR | Status: DC | PRN

## 2013-07-17 MED ORDER — PROPOFOL 10 MG/ML IV BOLUS
INTRAVENOUS | Status: DC | PRN
  Administered 2013-07-17: 160 mg via INTRAVENOUS

## 2013-07-17 MED ORDER — ACETAMINOPHEN 325 MG PO TABS
650.0000 mg | ORAL_TABLET | Freq: Four times a day (QID) | ORAL | Status: DC | PRN
  Administered 2013-07-18 – 2013-07-21 (×4): 650 mg via ORAL
  Filled 2013-07-17 (×4): qty 2

## 2013-07-17 SURGICAL SUPPLY — 40 items
BAG ZIPLOCK 12X15 (MISCELLANEOUS) ×3 IMPLANT
BLADE SAW SAG 73X25 THK (BLADE) ×2
BLADE SAW SGTL 73X25 THK (BLADE) ×1 IMPLANT
CAPT HIP HD POR BIPOL/UNIPOL ×3 IMPLANT
DERMABOND ADVANCED (GAUZE/BANDAGES/DRESSINGS) ×2
DERMABOND ADVANCED .7 DNX12 (GAUZE/BANDAGES/DRESSINGS) ×1 IMPLANT
DRAPE INCISE IOBAN 66X45 STRL (DRAPES) ×3 IMPLANT
DRAPE ORTHO SPLIT 77X108 STRL (DRAPES) ×4
DRAPE SURG ORHT 6 SPLT 77X108 (DRAPES) ×2 IMPLANT
DRAPE U-SHAPE 47X51 STRL (DRAPES) ×3 IMPLANT
DRSG AQUACEL AG ADV 3.5X 4 (GAUZE/BANDAGES/DRESSINGS) IMPLANT
DRSG AQUACEL AG ADV 3.5X10 (GAUZE/BANDAGES/DRESSINGS) ×3 IMPLANT
DURAPREP 26ML APPLICATOR (WOUND CARE) ×3 IMPLANT
ELECT REM PT RETURN 9FT ADLT (ELECTROSURGICAL) ×3
ELECTRODE REM PT RTRN 9FT ADLT (ELECTROSURGICAL) ×1 IMPLANT
EVACUATOR 1/8 PVC DRAIN (DRAIN) ×3 IMPLANT
GLOVE BIOGEL PI IND STRL 8 (GLOVE) ×1 IMPLANT
GLOVE BIOGEL PI INDICATOR 8 (GLOVE) ×2
GLOVE ECLIPSE 8.0 STRL XLNG CF (GLOVE) ×6 IMPLANT
GOWN STRL REUS W/TWL LRG LVL3 (GOWN DISPOSABLE) ×3 IMPLANT
GOWN STRL REUS W/TWL XL LVL3 (GOWN DISPOSABLE) ×6 IMPLANT
HANDPIECE INTERPULSE COAX TIP (DISPOSABLE)
IMMOBILIZER KNEE 20 (SOFTGOODS) ×3 IMPLANT
NEEDLE HYPO 21X1.5 SAFETY (NEEDLE) ×3 IMPLANT
PACK TOTAL JOINT (CUSTOM PROCEDURE TRAY) ×3 IMPLANT
PASSER SUT SWANSON 36MM LOOP (INSTRUMENTS) IMPLANT
POSITIONER SURGICAL ARM (MISCELLANEOUS) ×3 IMPLANT
SET HNDPC FAN SPRY TIP SCT (DISPOSABLE) IMPLANT
SPONGE SURGIFOAM ABS GEL 100 (HEMOSTASIS) ×3 IMPLANT
STAPLER VISISTAT 35W (STAPLE) IMPLANT
SUT VIC AB 0 CT1 27 (SUTURE) ×4
SUT VIC AB 0 CT1 27XBRD ANTBC (SUTURE) ×2 IMPLANT
SUT VIC AB 1 CT1 27 (SUTURE) ×8
SUT VIC AB 1 CT1 27XBRD ANTBC (SUTURE) ×4 IMPLANT
SUT VIC AB 2-0 CT1 27 (SUTURE) ×2
SUT VIC AB 2-0 CT1 TAPERPNT 27 (SUTURE) ×1 IMPLANT
SYR 20CC LL (SYRINGE) ×3 IMPLANT
TOWEL OR 17X26 10 PK STRL BLUE (TOWEL DISPOSABLE) ×6 IMPLANT
TOWER CARTRIDGE SMART MIX (DISPOSABLE) IMPLANT
TRAY FOLEY CATH 14FRSI W/METER (CATHETERS) IMPLANT

## 2013-07-17 NOTE — Consult Note (Signed)
Reason for ConsultLeft Hip Fracture Referring PhysicianViyuoh  LAM [name] is an 80 y.o. male.  HPI: He fell at his place of residence last night.  Past Medical History  Diagnosis Date  . Hypertension   . Hyperlipidemia   . Seizure disorder   . CKD (chronic kidney disease), stage III   . Altered mental state 08/31/2012    "came into hospital a little disoriented; it's gotten worse; this is not normal" (08/31/2012)  . Type II diabetes mellitus   . Chronic lower back pain   . Frequent falls   . brain tumor dx'd 2002    surg only; refused xrt last yr per family member  . DDD (degenerative disc disease), lumbar   . Dementia   . Seizure disorder     with left leg weakness (Todd's phenomenon)  . Dysphagia   . Assistance needed for mobility     walks with walker  . Sialoadenitis     Past Surgical History  Procedure Laterality Date  . Brain tumor excision    . 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 Hypertension Daughter     Social History:  reports that he has never smoked. He has never used smokeless tobacco. He reports that he does not drink alcohol or use illicit drugs.  Allergies: No Known Allergies  Medications: I have reviewed the patient's current medications.  Results for orders placed during the hospital encounter of 07/16/13 (from the past 48 hour(s))  BASIC METABOLIC PANEL     Status: Abnormal   Collection Time    07/16/13  9:50 PM      Result Value Range   Sodium 140  137 - 147 mEq/L   Potassium 4.5  3.7 - 5.3 mEq/L   Chloride 102  96 - 112 mEq/L   CO2 28  19 - 32 mEq/L   Glucose, Bld 105 (*) 70 - 99 mg/dL   BUN 36 (*) 6 - 23 mg/dL   Creatinine, Ser 2.09 (*) 0.50 - 1.35 mg/dL   Calcium 10.0  8.4 - 10.5 mg/dL   GFR calc non Af Amer 28 (*) >90 mL/min   GFR calc Af Amer 32 (*) >90 mL/min   Comment: (NOTE)     The eGFR has been calculated using the CKD EPI equation.     This calculation has not been  validated in all clinical situations.     eGFR's persistently <90 mL/min signify possible Chronic Kidney     Disease.  CBC WITH DIFFERENTIAL     Status: Abnormal   Collection Time    07/16/13  9:50 PM      Result Value Range   WBC 5.4  4.0 - 10.5 K/uL   RBC 3.67 (*) 4.22 - 5.81 MIL/uL   Hemoglobin 11.6 (*) 13.0 - 17.0 g/dL   HCT 34.0 (*) 39.0 - 52.0 %   MCV 92.6  78.0 - 100.0 fL   MCH 31.6  26.0 - 34.0 pg   MCHC 34.1  30.0 - 36.0 g/dL   RDW 13.2  11.5 - 15.5 %   Platelets 138 (*) 150 - 400 K/uL   Neutrophils Relative % 58  43 - 77 %   Neutro Abs 3.2  1.7 - 7.7 K/uL   Lymphocytes Relative 32  12 - 46 %   Lymphs Abs 1.7  0.7 - 4.0 K/uL   Monocytes Relative 8  3 - 12 %  Monocytes Absolute 0.4  0.1 - 1.0 K/uL   Eosinophils Relative 1  0 - 5 %   Eosinophils Absolute 0.1  0.0 - 0.7 K/uL   Basophils Relative 0  0 - 1 %   Basophils Absolute 0.0  0.0 - 0.1 K/uL  PROTIME-INR     Status: None   Collection Time    07/16/13  9:50 PM      Result Value Range   Prothrombin Time 13.8  11.6 - 15.2 seconds   INR 1.08  0.00 - 1.49  POCT I-STAT TROPONIN I     Status: None   Collection Time    07/16/13 10:13 PM      Result Value Range   Troponin i, poc 0.01  0.00 - 0.08 ng/mL   Comment 3            Comment: Due to the release kinetics of cTnI,     a negative result within the first hours     of the onset of symptoms does not rule out     myocardial infarction with certainty.     If myocardial infarction is still suspected,     repeat the test at appropriate intervals.  URINALYSIS, ROUTINE W REFLEX MICROSCOPIC     Status: Abnormal   Collection Time    07/16/13 11:08 PM      Result Value Range   Color, Urine YELLOW  YELLOW   APPearance CLEAR  CLEAR   Specific Gravity, Urine 1.017  1.005 - 1.030   pH 6.0  5.0 - 8.0   Glucose, UA NEGATIVE  NEGATIVE mg/dL   Hgb urine dipstick NEGATIVE  NEGATIVE   Bilirubin Urine NEGATIVE  NEGATIVE   Ketones, ur NEGATIVE  NEGATIVE mg/dL   Protein, ur 30  (*) NEGATIVE mg/dL   Urobilinogen, UA 0.2  0.0 - 1.0 mg/dL   Nitrite NEGATIVE  NEGATIVE   Leukocytes, UA NEGATIVE  NEGATIVE  URINE MICROSCOPIC-ADD ON     Status: Abnormal   Collection Time    07/16/13 11:08 PM      Result Value Range   WBC, UA 0-2  <3 WBC/hpf   RBC / HPF 0-2  <3 RBC/hpf   Casts HYALINE CASTS (*) NEGATIVE  TYPE AND SCREEN     Status: None   Collection Time    07/16/13 11:20 PM      Result Value Range   ABO/RH(D) A POS     Antibody Screen NEG     Sample Expiration 07/19/2013    ABO/RH     Status: None   Collection Time    07/16/13 11:20 PM      Result Value Range   ABO/RH(D) A POS    GLUCOSE, CAPILLARY     Status: None   Collection Time    07/17/13 12:45 AM      Result Value Range   Glucose-Capillary 99  70 - 99 mg/dL  SURGICAL PCR SCREEN     Status: None   Collection Time    07/17/13  1:13 AM      Result Value Range   MRSA, PCR NEGATIVE  NEGATIVE   Staphylococcus aureus NEGATIVE  NEGATIVE   Comment:            The Xpert SA Assay (FDA     approved for NASAL specimens     in patients over 54 years of age),     is one component of     a comprehensive surveillance  program.  Test performance has     been validated by [facility] for patients greater     than or equal to 69 year old.     It is not intended     to diagnose infection nor to     guide or monitor treatment.  COMPREHENSIVE METABOLIC PANEL     Status: Abnormal   Collection Time    07/17/13  4:52 AM      Result Value Range   Sodium 137  137 - 147 mEq/L   Potassium 4.7  3.7 - 5.3 mEq/L   Chloride 102  96 - 112 mEq/L   CO2 26  19 - 32 mEq/L   Glucose, Bld 122 (*) 70 - 99 mg/dL   BUN 34 (*) 6 - 23 mg/dL   Creatinine, Ser 1.88 (*) 0.50 - 1.35 mg/dL   Calcium 9.4  8.4 - 10.5 mg/dL   Total Protein 6.4  6.0 - 8.3 g/dL   Albumin 3.0 (*) 3.5 - 5.2 g/dL   AST 13  0 - 37 U/L   ALT 15  0 - 53 U/L   Alkaline Phosphatase 62  39 - 117 U/L   Total Bilirubin 0.3  0.3 - 1.2 mg/dL   GFR calc  non Af Amer 32 (*) >90 mL/min   GFR calc Af Amer 37 (*) >90 mL/min   Comment: (NOTE)     The eGFR has been calculated using the CKD EPI equation.     This calculation has not been validated in all clinical situations.     eGFR's persistently <90 mL/min signify possible Chronic Kidney     Disease.  CBC     Status: Abnormal   Collection Time    07/17/13  4:52 AM      Result Value Range   WBC 9.4  4.0 - 10.5 K/uL   RBC 3.48 (*) 4.22 - 5.81 MIL/uL   Hemoglobin 10.7 (*) 13.0 - 17.0 g/dL   HCT 31.8 (*) 39.0 - 52.0 %   MCV 91.4  78.0 - 100.0 fL   MCH 30.7  26.0 - 34.0 pg   MCHC 33.6  30.0 - 36.0 g/dL   RDW 13.2  11.5 - 15.5 %   Platelets 138 (*) 150 - 400 K/uL  GLUCOSE, CAPILLARY     Status: Abnormal   Collection Time    07/17/13  7:19 AM      Result Value Range   Glucose-Capillary 106 (*) 70 - 99 mg/dL    Dg Hip Complete Left  07/16/2013   CLINICAL DATA:  Left hip pain after fall.  EXAM: LEFT HIP - COMPLETE 2+ VIEW  COMPARISON:  None available for comparison at time of study interpretation.  FINDINGS: Left subcapital femur fracture with varus angulation distal bony fragments, impaction. Femoral head is well formed and located. No dislocation.  Bone mineral density is decreased without destructive bony lesions. Periarticular soft tissue planes are nonsuspicious.  IMPRESSION: Angulated impacted left subcapital femur fracture without dislocation.   Electronically Signed   By: Elon Alas   On: 07/16/2013 22:36   Dg Knee 1-2 Views Left  07/16/2013   CLINICAL DATA:  Fall with left knee pain.  EXAM: LEFT KNEE - 1-2 VIEW  COMPARISON:  None.  FINDINGS: Oblique images were not obtained due to limited patient mobility because of a hip fracture.  Bones are osteopenic. There is no evidence of acute fracture or dislocation. No joint effusion is seen.  Patellar enthesophytes are present. Medial and lateral compartment joint spaces are maintained. Vascular calcifications are present.  IMPRESSION: No  evidence of acute osseous abnormality involving the left knee.   Electronically Signed   By: Logan Bores   On: 07/16/2013 22:38    Review of Systems  Constitutional: Negative.   HENT: Negative.   Eyes: Negative.   Respiratory: Negative.   Cardiovascular: Negative.   Gastrointestinal: Negative.   Genitourinary: Negative.   Musculoskeletal: Positive for falls and joint pain.  Skin: Negative.   Neurological: Negative.   Endo/Heme/Allergies: Negative.    Blood pressure 175/76, pulse 62, temperature 98.7 F (37.1 C), temperature source Oral, resp. rate 16, height _0  (1.88 m), weight 89.812 kg (198 lb), SpO2 97.00%. Physical Exam  Constitutional: He appears well-developed.  HENT:  Head: Normocephalic.  Eyes: Pupils are equal, round, and reactive to light.  Neck: Normal range of motion.  Cardiovascular: Normal rate.   Respiratory: Effort normal.  GI: Soft. There is tenderness.  Musculoskeletal:  Pain and deformity of left lower extremity  Neurological:  Slightly confused.  Skin: Skin is warm.    Assessment/Plan: WIll contact his family and will do a Unipolar Prosthesis of Left Hip  Osama Coleson A 07/17/2013, 7:33 AM

## 2013-07-17 NOTE — Brief Op Note (Signed)
07/16/2013 - 07/17/2013  12:11 PM  PATIENT:  Kurt [name]  80 y.o. male  PRE-OPERATIVE DIAGNOSIS:  left hip fracture,Displaced,COMPLEX  POST-OPERATIVE DIAGNOSIS:  left hip fracture,Displaced,COMPLEX  PROCEDURE:  Procedure(s): ARTHROPLASTY BIPOLAR HIP (Left)  SURGEON:  Surgeon(s) and Role:    * Kurt Bastos, MD - Primary  PHYSICIAN ASSISTANT:Kurt [place] PA   ASSISTANTS: Kurt Jourdain PA  ANESTHESIA:   general  EBL:  Total I/O In: 1000 [I.V.:1000] Out: 300 [Urine:100; Blood:200]  BLOOD ADMINISTERED:none  DRAINS: none   LOCAL MEDICATIONS USED:  BUPIVICAINE 20cc mixed with 20cc of Normal Saline   SPECIMEN:  No Specimen  DISPOSITION OF SPECIMEN:  N/A  COUNTS:  YES  TOURNIQUET:  * No tourniquets in log *  DICTATION: .Other Dictation: Dictation Number [id number]  PLAN OF CARE: Admit to inpatient   PATIENT DISPOSITION:  Atable in [name]   Delay start of Pharmacological VTE agent (>24hrs) due to surgical blood loss [name] risk of bleeding: yes

## 2013-07-17 NOTE — Progress Notes (Signed)
Utilization review completed.  

## 2013-07-17 NOTE — ED Provider Notes (Signed)
Medical screening examination/treatment/procedure(s) were conducted as a shared visit with non-physician practitioner(s) and myself.  I personally evaluated the patient during the encounter   .Face to face Exam:  General:  A&Ox3 HEENT:  Atraumatic Resp:  Normal effort Abd:  Nondistended Neuro:No focal deficits     Dot Lanes, MD 07/17/13 1035

## 2013-07-17 NOTE — Interval H&P Note (Signed)
History and Physical Interval Note:  07/17/2013 10:16 AM  Kurt [name]  has presented today for surgery, with the diagnosis of left hip fracture  The various methods of treatment have been discussed with the patient and family. After consideration of risks, benefits and other options for treatment, the patient has consented to  Procedure(s): ARTHROPLASTY BIPOLAR HIP (Left) as a surgical intervention .  The patient's history has been reviewed, patient examined, no change in status, stable for surgery.  I have reviewed the patient's chart and labs.  Questions were answered to the patient's satisfaction.     Derico Mitton A

## 2013-07-17 NOTE — Transfer of Care (Signed)
Immediate Anesthesia Transfer of Care Note  Patient: Kurt [name]  Procedure(s) Performed: Procedure(s): ARTHROPLASTY BIPOLAR HIP (Left)  Patient Location: PACU  Anesthesia Type:General  Level of Consciousness: awake and patient cooperative  Airway & Oxygen Therapy: Patient Spontanous Breathing and Patient connected to face mask oxygen  Post-op Assessment: Report given to PACU RN and Post -op Vital signs reviewed and stable  Post vital signs: Reviewed and stable  Complications: No apparent anesthesia complications

## 2013-07-17 NOTE — H&P (View-Only) (Signed)
Reason for ConsultLeft Hip Fracture Referring PhysicianViyuoh  LAM [name] is an 80 y.o. male.  HPI: He fell at his place of residence last night.  Past Medical History  Diagnosis Date  . Hypertension   . Hyperlipidemia   . Seizure disorder   . CKD (chronic kidney disease), stage III   . Altered mental state 08/31/2012    "came into hospital a little disoriented; it's gotten worse; this is not normal" (08/31/2012)  . Type II diabetes mellitus   . Chronic lower back pain   . Frequent falls   . brain tumor dx'd 2002    surg only; refused xrt last yr per family member  . DDD (degenerative disc disease), lumbar   . Dementia   . Seizure disorder     with left leg weakness (Todd's phenomenon)  . Dysphagia   . Assistance needed for mobility     walks with walker  . Sialoadenitis     Past Surgical History  Procedure Laterality Date  . Brain tumor excision    . 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 Hypertension Daughter     Social History:  reports that he has never smoked. He has never used smokeless tobacco. He reports that he does not drink alcohol or use illicit drugs.  Allergies: No Known Allergies  Medications: I have reviewed the patient's current medications.  Results for orders placed during the hospital encounter of 07/16/13 (from the past 48 hour(s))  BASIC METABOLIC PANEL     Status: Abnormal   Collection Time    07/16/13  9:50 PM      Result Value Range   Sodium 140  137 - 147 mEq/L   Potassium 4.5  3.7 - 5.3 mEq/L   Chloride 102  96 - 112 mEq/L   CO2 28  19 - 32 mEq/L   Glucose, Bld 105 (*) 70 - 99 mg/dL   BUN 36 (*) 6 - 23 mg/dL   Creatinine, Ser 2.09 (*) 0.50 - 1.35 mg/dL   Calcium 10.0  8.4 - 10.5 mg/dL   GFR calc non Af Amer 28 (*) >90 mL/min   GFR calc Af Amer 32 (*) >90 mL/min   Comment: (NOTE)     The eGFR has been calculated using the CKD EPI equation.     This calculation has not been  validated in all clinical situations.     eGFR's persistently <90 mL/min signify possible Chronic Kidney     Disease.  CBC WITH DIFFERENTIAL     Status: Abnormal   Collection Time    07/16/13  9:50 PM      Result Value Range   WBC 5.4  4.0 - 10.5 K/uL   RBC 3.67 (*) 4.22 - 5.81 MIL/uL   Hemoglobin 11.6 (*) 13.0 - 17.0 g/dL   HCT 34.0 (*) 39.0 - 52.0 %   MCV 92.6  78.0 - 100.0 fL   MCH 31.6  26.0 - 34.0 pg   MCHC 34.1  30.0 - 36.0 g/dL   RDW 13.2  11.5 - 15.5 %   Platelets 138 (*) 150 - 400 K/uL   Neutrophils Relative % 58  43 - 77 %   Neutro Abs 3.2  1.7 - 7.7 K/uL   Lymphocytes Relative 32  12 - 46 %   Lymphs Abs 1.7  0.7 - 4.0 K/uL   Monocytes Relative 8  3 - 12 %  Monocytes Absolute 0.4  0.1 - 1.0 K/uL   Eosinophils Relative 1  0 - 5 %   Eosinophils Absolute 0.1  0.0 - 0.7 K/uL   Basophils Relative 0  0 - 1 %   Basophils Absolute 0.0  0.0 - 0.1 K/uL  PROTIME-INR     Status: None   Collection Time    07/16/13  9:50 PM      Result Value Range   Prothrombin Time 13.8  11.6 - 15.2 seconds   INR 1.08  0.00 - 1.49  POCT I-STAT TROPONIN I     Status: None   Collection Time    07/16/13 10:13 PM      Result Value Range   Troponin i, poc 0.01  0.00 - 0.08 ng/mL   Comment 3            Comment: Due to the release kinetics of cTnI,     a negative result within the first hours     of the onset of symptoms does not rule out     myocardial infarction with certainty.     If myocardial infarction is still suspected,     repeat the test at appropriate intervals.  URINALYSIS, ROUTINE W REFLEX MICROSCOPIC     Status: Abnormal   Collection Time    07/16/13 11:08 PM      Result Value Range   Color, Urine YELLOW  YELLOW   APPearance CLEAR  CLEAR   Specific Gravity, Urine 1.017  1.005 - 1.030   pH 6.0  5.0 - 8.0   Glucose, UA NEGATIVE  NEGATIVE mg/dL   Hgb urine dipstick NEGATIVE  NEGATIVE   Bilirubin Urine NEGATIVE  NEGATIVE   Ketones, ur NEGATIVE  NEGATIVE mg/dL   Protein, ur 30  (*) NEGATIVE mg/dL   Urobilinogen, UA 0.2  0.0 - 1.0 mg/dL   Nitrite NEGATIVE  NEGATIVE   Leukocytes, UA NEGATIVE  NEGATIVE  URINE MICROSCOPIC-ADD ON     Status: Abnormal   Collection Time    07/16/13 11:08 PM      Result Value Range   WBC, UA 0-2  <3 WBC/hpf   RBC / HPF 0-2  <3 RBC/hpf   Casts HYALINE CASTS (*) NEGATIVE  TYPE AND SCREEN     Status: None   Collection Time    07/16/13 11:20 PM      Result Value Range   ABO/RH(D) A POS     Antibody Screen NEG     Sample Expiration 07/19/2013    ABO/RH     Status: None   Collection Time    07/16/13 11:20 PM      Result Value Range   ABO/RH(D) A POS    GLUCOSE, CAPILLARY     Status: None   Collection Time    07/17/13 12:45 AM      Result Value Range   Glucose-Capillary 99  70 - 99 mg/dL  SURGICAL PCR SCREEN     Status: None   Collection Time    07/17/13  1:13 AM      Result Value Range   MRSA, PCR NEGATIVE  NEGATIVE   Staphylococcus aureus NEGATIVE  NEGATIVE   Comment:            The Xpert SA Assay (FDA     approved for NASAL specimens     in patients over 54 years of age),     is one component of     a comprehensive surveillance  program.  Test performance has     been validated by [facility] for patients greater     than or equal to 69 year old.     It is not intended     to diagnose infection nor to     guide or monitor treatment.  COMPREHENSIVE METABOLIC PANEL     Status: Abnormal   Collection Time    07/17/13  4:52 AM      Result Value Range   Sodium 137  137 - 147 mEq/L   Potassium 4.7  3.7 - 5.3 mEq/L   Chloride 102  96 - 112 mEq/L   CO2 26  19 - 32 mEq/L   Glucose, Bld 122 (*) 70 - 99 mg/dL   BUN 34 (*) 6 - 23 mg/dL   Creatinine, Ser 1.88 (*) 0.50 - 1.35 mg/dL   Calcium 9.4  8.4 - 10.5 mg/dL   Total Protein 6.4  6.0 - 8.3 g/dL   Albumin 3.0 (*) 3.5 - 5.2 g/dL   AST 13  0 - 37 U/L   ALT 15  0 - 53 U/L   Alkaline Phosphatase 62  39 - 117 U/L   Total Bilirubin 0.3  0.3 - 1.2 mg/dL   GFR calc  non Af Amer 32 (*) >90 mL/min   GFR calc Af Amer 37 (*) >90 mL/min   Comment: (NOTE)     The eGFR has been calculated using the CKD EPI equation.     This calculation has not been validated in all clinical situations.     eGFR's persistently <90 mL/min signify possible Chronic Kidney     Disease.  CBC     Status: Abnormal   Collection Time    07/17/13  4:52 AM      Result Value Range   WBC 9.4  4.0 - 10.5 K/uL   RBC 3.48 (*) 4.22 - 5.81 MIL/uL   Hemoglobin 10.7 (*) 13.0 - 17.0 g/dL   HCT 31.8 (*) 39.0 - 52.0 %   MCV 91.4  78.0 - 100.0 fL   MCH 30.7  26.0 - 34.0 pg   MCHC 33.6  30.0 - 36.0 g/dL   RDW 13.2  11.5 - 15.5 %   Platelets 138 (*) 150 - 400 K/uL  GLUCOSE, CAPILLARY     Status: Abnormal   Collection Time    07/17/13  7:19 AM      Result Value Range   Glucose-Capillary 106 (*) 70 - 99 mg/dL    Dg Hip Complete Left  07/16/2013   CLINICAL DATA:  Left hip pain after fall.  EXAM: LEFT HIP - COMPLETE 2+ VIEW  COMPARISON:  None available for comparison at time of study interpretation.  FINDINGS: Left subcapital femur fracture with varus angulation distal bony fragments, impaction. Femoral head is well formed and located. No dislocation.  Bone mineral density is decreased without destructive bony lesions. Periarticular soft tissue planes are nonsuspicious.  IMPRESSION: Angulated impacted left subcapital femur fracture without dislocation.   Electronically Signed   By: Elon Alas   On: 07/16/2013 22:36   Dg Knee 1-2 Views Left  07/16/2013   CLINICAL DATA:  Fall with left knee pain.  EXAM: LEFT KNEE - 1-2 VIEW  COMPARISON:  None.  FINDINGS: Oblique images were not obtained due to limited patient mobility because of a hip fracture.  Bones are osteopenic. There is no evidence of acute fracture or dislocation. No joint effusion is seen.  Patellar enthesophytes are present. Medial and lateral compartment joint spaces are maintained. Vascular calcifications are present.  IMPRESSION: No  evidence of acute osseous abnormality involving the left knee.   Electronically Signed   By: Logan Bores   On: 07/16/2013 22:38    Review of Systems  Constitutional: Negative.   HENT: Negative.   Eyes: Negative.   Respiratory: Negative.   Cardiovascular: Negative.   Gastrointestinal: Negative.   Genitourinary: Negative.   Musculoskeletal: Positive for falls and joint pain.  Skin: Negative.   Neurological: Negative.   Endo/Heme/Allergies: Negative.    Blood pressure 175/76, pulse 62, temperature 98.7 F (37.1 C), temperature source Oral, resp. rate 16, height _0  (1.88 m), weight 89.812 kg (198 lb), SpO2 97.00%. Physical Exam  Constitutional: He appears well-developed.  HENT:  Head: Normocephalic.  Eyes: Pupils are equal, round, and reactive to light.  Neck: Normal range of motion.  Cardiovascular: Normal rate.   Respiratory: Effort normal.  GI: Soft. There is tenderness.  Musculoskeletal:  Pain and deformity of left lower extremity  Neurological:  Slightly confused.  Skin: Skin is warm.    Assessment/Plan: WIll contact his family and will do a Unipolar Prosthesis of Left Hip  Kianni Lheureux A 07/17/2013, 7:33 AM

## 2013-07-17 NOTE — Progress Notes (Signed)
[name] called pt's daughter to make aware and explain surgery to be done this am. Consent received via phone from daughter for surgery as well as blood products (if needed). Verified by two RN's.

## 2013-07-17 NOTE — Anesthesia Preprocedure Evaluation (Signed)
Anesthesia Evaluation  Patient identified by MRN, date of birth, ID band Patient awake    Reviewed: Allergy & Precautions, H&P , NPO status , Patient's Chart, lab work & pertinent test results, reviewed documented beta blocker date and time   Airway Mallampati: II TM Distance: >3 FB Neck ROM: full    Dental no notable dental hx.    Pulmonary neg pulmonary ROS,    Pulmonary exam normal       Cardiovascular Exercise Tolerance: Good hypertension, Pt. on medications and Pt. on home beta blockers     Neuro/Psych Dementianegative neurological ROS     GI/Hepatic negative GI ROS, Neg liver ROS,   Endo/Other  negative endocrine ROSdiabetes, Well Controlled, Type 2BS 105 this A.M.  Renal/GU negative Renal ROS  negative genitourinary   Musculoskeletal   Abdominal Normal abdominal exam  (+)   Peds  Hematology negative hematology ROS (+)   Anesthesia Other Findings   Reproductive/Obstetrics negative OB ROS                           Anesthesia Physical Anesthesia Plan  ASA: II  Anesthesia Plan: General   Post-op Pain Management:    Induction: Intravenous  Airway Management Planned: Oral ETT  Additional Equipment:   Intra-op Plan:   Post-operative Plan: Extubation in OR  Informed Consent: I have reviewed the patients History and Physical, chart, labs and discussed the procedure including the risks, benefits and alternatives for the proposed anesthesia with the patient or authorized representative who has indicated his/her understanding and acceptance.   Dental Advisory Given  Plan Discussed with: CRNA and Surgeon  Anesthesia Plan Comments:         Anesthesia Quick Evaluation

## 2013-07-17 NOTE — Progress Notes (Signed)
ANTICOAGULATION CONSULT NOTE - Initial Consult  Pharmacy Consult for Coumadin Indication: VTE prophylaxis  No Known Allergies  Patient Measurements: Height: 6\' 2"  (188 cm) Weight: 198 lb (89.812 kg) IBW/kg (Calculated) : 82.2  Vital Signs: Temp: 98.3 F (36.8 C) (02/08 1346) Temp src: Oral (02/08 0951) BP: 162/83 mmHg (02/08 1346) Pulse Rate: 60 (02/08 1346)  Labs:  Recent Labs  07/16/13 2150 07/17/13 0452  HGB 11.6* 10.7*  HCT 34.0* 31.8*  PLT 138* 138*  LABPROT 13.8  --   INR 1.08  --   CREATININE 2.09* 1.88*    Estimated Creatinine Clearance: 35.2 ml/min (by C-G formula based on Cr of 1.88).   Medical History: Past Medical History  Diagnosis Date  . Hypertension   . Hyperlipidemia   . Seizure disorder   . CKD (chronic kidney disease), stage III   . Altered mental state 08/31/2012    "came into hospital a little disoriented; it's gotten worse; this is not normal" (08/31/2012)  . Type II diabetes mellitus   . Chronic lower back pain   . Frequent falls   . brain tumor dx'd 2002    surg only; refused xrt last yr per family member  . DDD (degenerative disc disease), lumbar   . Dementia   . Seizure disorder     with left leg weakness (Todd's phenomenon)  . Dysphagia   . Assistance needed for mobility     walks with walker  . Sialoadenitis     Medications:  Prescriptions prior to admission  Medication Sig Dispense Refill  . amLODipine (NORVASC) 2.5 MG tablet Take 2.5 mg by mouth daily.      Marland Kitchen aspirin EC 81 MG tablet Take 81 mg by mouth daily.      Marland Kitchen atenolol (TENORMIN) 50 MG tablet Take 50 mg by mouth daily.      . cholecalciferol (VITAMIN D) 400 UNITS TABS tablet Take 400 Units by mouth daily.      . divalproex (DEPAKOTE SPRINKLE) 125 MG capsule Take 125-250 mg by mouth 4 (four) times daily. Take 2 capsules by mouth at 8AM, then Take 1 capsule by mouth at noon, 4pm, and 8pm      . ferrous sulfate 325 (65 FE) MG tablet Take 325 mg by mouth daily with  breakfast.      . finasteride (PROSCAR) 5 MG tablet Take 5 mg by mouth daily.      Marland Kitchen levETIRAcetam (KEPPRA) 500 MG tablet Take 500 mg by mouth every 12 (twelve) hours.      Marland Kitchen LORazepam (ATIVAN) 1 MG tablet Take 1 mg by mouth every 6 (six) hours as needed for anxiety.       . Memantine HCl ER (NAMENDA XR) 28 MG CP24 Take 1 capsule by mouth daily.      . Multiple Vitamin (MULTIVITAMIN WITH MINERALS) TABS tablet Take 1 tablet by mouth daily.      . pantoprazole (PROTONIX) 40 MG tablet Take 40 mg by mouth daily.      . simvastatin (ZOCOR) 20 MG tablet Take 20 mg by mouth every evening.      . Memantine HCl ER (NAMENDA XR) 14 MG CP24 Take 1 capsule by mouth daily. For 7 days        Assessment: 80yo M with dementia fell at home and broke his hip on 2/6. Underwent L bipolar hip arthroplasty on 2/8. Pharmacy is asked to start Coumadin.   Goal of Therapy:  INR 2-3 Monitor platelets by anticoagulation protocol:  Yes   Plan:   Start with Coumadin 4mg  tonight.  Check PT/INR daily.  F/u disposition plan for this patient with dementia.  Provide Coumadin education if indicated.   Romeo Rabon, PharmD, pager [phone]. 07/17/2013,2:37 PM.

## 2013-07-17 NOTE — Progress Notes (Signed)
Report called to [employer], RN on pt and history. Pt transitioned to [place] post surgery.

## 2013-07-17 NOTE — Progress Notes (Signed)
[facility] PROGRESS NOTE  Kurt [name] LOV:[id number] DOB: [birth year] DOA: 07/16/2013 PCP: [name], MD  Assessment/Plan: 1.Left Femoral fracture:  -Per orthopedics, status post surgery per [name]   -Continue pain management  2.HTN: hemodynamically stable. Continue home meds.  3.Seizure d/o: clinically stable. Continue home meds.  4.DM: SSI. A1C.  5.Anemia: baseline hgb 11-12. Near baseline. Likely ACD. Continue to follow.  6.CKD: Stage 3-4 CKD.  -At baseline. Gentle hydration,  -follow 7.Dementia: continue namenda.    Code Status: DO NOT RESUSCITATE Family Communication: Friend and 2 daughters at bedside Disposition Plan: Pending clinical course   Consultants:  Orthopedics  Procedures:  Status post left arthroplasty bipolar hip  Antibiotics:  Cefazolin  HPI/Subjective: States he feels much better today, denies nausea vomiting and no chest pain  Objective: Filed Vitals:   07/17/13 1445  BP: 120/70  Pulse: 71  Temp: 97.7 F (36.5 C)  Resp: 15    Intake/Output Summary (Last 24 hours) at 07/17/13 1543 Last data filed at 07/17/13 1453  Gross per 24 hour  Intake 1718.75 ml  Output    875 ml  Net 843.75 ml   Filed Weights   07/16/13 2054  Weight: 89.812 kg (198 lb)    Exam:  General: alert & answers appropriately In NAD Cardiovascular: RRR, nl S1 s2 Respiratory: CTAB Abdomen: soft +BS NT/ND, no masses palpable Extremities: No cyanosis and no edema    Data Reviewed: Basic Metabolic Panel:  Recent Labs Lab 07/16/13 2150 07/17/13 0452  NA 140 137  K 4.5 4.7  CL 102 102  CO2 28 26  GLUCOSE 105* 122*  BUN 36* 34*  CREATININE 2.09* 1.88*  CALCIUM 10.0 9.4   Liver Function Tests:  Recent Labs Lab 07/17/13 0452  AST 13  ALT 15  ALKPHOS 62  BILITOT 0.3  PROT 6.4  ALBUMIN 3.0*   No results found for this basename: LIPASE, AMYLASE,  in the last 168 hours No results found for this basename: AMMONIA,  in the last 168  hours CBC:  Recent Labs Lab 07/16/13 2150 07/17/13 0452  WBC 5.4 9.4  NEUTROABS 3.2  --   HGB 11.6* 10.7*  HCT 34.0* 31.8*  MCV 92.6 91.4  PLT 138* 138*   Cardiac Enzymes: No results found for this basename: CKTOTAL, CKMB, CKMBINDEX, TROPONINI,  in the last 168 hours BNP (last 3 results) No results found for this basename: PROBNP,  in the last 8760 hours CBG:  Recent Labs Lab 07/17/13 0045 07/17/13 0719 07/17/13 1245  GLUCAP 99 106* 109*    Recent Results (from the past 240 hour(s))  SURGICAL PCR SCREEN     Status: None   Collection Time    07/17/13  1:13 AM      Result Value Range Status   MRSA, PCR NEGATIVE  NEGATIVE Final   Staphylococcus aureus NEGATIVE  NEGATIVE Final   Comment:            The Xpert SA Assay (FDA     approved for NASAL specimens     in patients over 78 years of age),     is one component of     a comprehensive surveillance     program.  Test performance has     been validated by [employer] for patients greater     than or equal to 78 year old.     It is not intended     to diagnose infection nor to  guide or monitor treatment.     Studies: Dg Hip Complete Left  07/16/2013   CLINICAL DATA:  Left hip pain after fall.  EXAM: LEFT HIP - COMPLETE 2+ VIEW  COMPARISON:  None available for comparison at time of study interpretation.  FINDINGS: Left subcapital femur fracture with varus angulation distal bony fragments, impaction. Femoral head is well formed and located. No dislocation.  Bone mineral density is decreased without destructive bony lesions. Periarticular soft tissue planes are nonsuspicious.  IMPRESSION: Angulated impacted left subcapital femur fracture without dislocation.   Electronically Signed   By: Elon Alas   On: 07/16/2013 22:36   Dg Knee 1-2 Views Left  07/16/2013   CLINICAL DATA:  Fall with left knee pain.  EXAM: LEFT KNEE - 1-2 VIEW  COMPARISON:  None.  FINDINGS: Oblique images were not obtained due to limited  patient mobility because of a hip fracture.  Bones are osteopenic. There is no evidence of acute fracture or dislocation. No joint effusion is seen. Patellar enthesophytes are present. Medial and lateral compartment joint spaces are maintained. Vascular calcifications are present.  IMPRESSION: No evidence of acute osseous abnormality involving the left knee.   Electronically Signed   By: Logan Bores   On: 07/16/2013 22:38   Dg Pelvis Portable  07/17/2013   CLINICAL DATA:  Surgery for a left femoral neck fracture.  EXAM: PORTABLE PELVIS 1-2 VIEWS  COMPARISON:  None.  FINDINGS: Left hip prosthesis in satisfactory position and alignment. No fracture or dislocation seen on a single frontal view. Diffuse osteopenia.  IMPRESSION: Satisfactory postoperative appearance of a left hip prosthesis.   Electronically Signed   By: Enrique Sack M.D.   On: 07/17/2013 13:30    Scheduled Meds: . amLODipine  2.5 mg Oral Daily  . antiseptic oral rinse  15 mL Mouth Rinse q12n4p  . atenolol  50 mg Oral Daily  .  ceFAZolin (ANCEF) IV  2 g Intravenous Q6H  . cholecalciferol  400 Units Oral Daily  . divalproex  125 mg Oral Q24H  . divalproex  250 mg Oral Custom  . ferrous sulfate  325 mg Oral TID PC  . finasteride  5 mg Oral Daily  . HYDROmorphone      . insulin aspart  0-15 Units Subcutaneous TID WC  . levETIRAcetam  500 mg Oral Q12H  . Memantine HCl ER  28 mg Oral Daily  . multivitamin with minerals  1 tablet Oral Daily  . pantoprazole  40 mg Oral Daily  . simvastatin  20 mg Oral QPM  . warfarin  4 mg Oral ONCE-1800  . Warfarin - Pharmacist Dosing Inpatient   Does not apply q1800   Continuous Infusions: . lactated ringers      Active Problems:   Hip fracture   Femoral fracture   Fracture of femoral neck, left    Time spent: [facility] Pager [phone]. If 7PM-7AM, please contact night-coverage at [email or web address], password [email or web address] 07/17/2013, 3:43 PM  LOS: 1 day

## 2013-07-17 NOTE — Preoperative (Signed)
Beta Blockers   Reason not to administer Beta Blockers:Not Applicable 

## 2013-07-17 NOTE — Anesthesia Postprocedure Evaluation (Signed)
Anesthesia Post Note  Patient: Kurt [name]  Procedure(s) Performed: Procedure(s) (LRB): ARTHROPLASTY BIPOLAR HIP (Left)  Anesthesia type: General  Patient location: PACU  Post pain: Pain level controlled  Post assessment: Post-op Vital signs reviewed  Last Vitals:  Filed Vitals:   07/17/13 0951  BP: 172/68  Pulse: 63  Temp: 37.2 C  Resp: 16    Post vital signs: Reviewed  Level of consciousness: sedated  Complications: No apparent anesthesia complications

## 2013-07-18 ENCOUNTER — Encounter (HOSPITAL_COMMUNITY): Payer: Self-pay | Admitting: Orthopedic Surgery

## 2013-07-18 ENCOUNTER — Inpatient Hospital Stay (HOSPITAL_COMMUNITY): Payer: Medicare Other

## 2013-07-18 DIAGNOSIS — G40909 Epilepsy, unspecified, not intractable, without status epilepticus: Secondary | ICD-10-CM

## 2013-07-18 DIAGNOSIS — I10 Essential (primary) hypertension: Secondary | ICD-10-CM

## 2013-07-18 DIAGNOSIS — D62 Acute posthemorrhagic anemia: Secondary | ICD-10-CM | POA: Diagnosis present

## 2013-07-18 LAB — URINE MICROSCOPIC-ADD ON

## 2013-07-18 LAB — CBC WITH DIFFERENTIAL/PLATELET
Basophils Absolute: 0 K/uL (ref 0.0–0.1)
Basophils Relative: 0 % (ref 0–1)
Eosinophils Absolute: 0.2 K/uL (ref 0.0–0.7)
Eosinophils Relative: 1 % (ref 0–5)
HCT: 21.5 % — ABNORMAL LOW (ref 39.0–52.0)
Hemoglobin: 7.3 g/dL — ABNORMAL LOW (ref 13.0–17.0)
Lymphocytes Relative: 22 % (ref 12–46)
Lymphs Abs: 2.6 K/uL (ref 0.7–4.0)
MCH: 31.3 pg (ref 26.0–34.0)
MCHC: 34 g/dL (ref 30.0–36.0)
MCV: 92.3 fL (ref 78.0–100.0)
Monocytes Absolute: 1.6 K/uL — ABNORMAL HIGH (ref 0.1–1.0)
Monocytes Relative: 14 % — ABNORMAL HIGH (ref 3–12)
Neutro Abs: 7.5 K/uL (ref 1.7–7.7)
Neutrophils Relative %: 63 % (ref 43–77)
Platelets: 120 K/uL — ABNORMAL LOW (ref 150–400)
RBC: 2.33 MIL/uL — ABNORMAL LOW (ref 4.22–5.81)
RDW: 13.7 % (ref 11.5–15.5)
WBC: 11.9 K/uL — ABNORMAL HIGH (ref 4.0–10.5)

## 2013-07-18 LAB — URINALYSIS, ROUTINE W REFLEX MICROSCOPIC
BILIRUBIN URINE: NEGATIVE
Glucose, UA: NEGATIVE mg/dL
KETONES UR: NEGATIVE mg/dL
NITRITE: NEGATIVE
Protein, ur: NEGATIVE mg/dL
Specific Gravity, Urine: 1.02 (ref 1.005–1.030)
UROBILINOGEN UA: 0.2 mg/dL (ref 0.0–1.0)
pH: 5 (ref 5.0–8.0)

## 2013-07-18 LAB — BASIC METABOLIC PANEL
BUN: 44 mg/dL — ABNORMAL HIGH (ref 6–23)
CALCIUM: 8.8 mg/dL (ref 8.4–10.5)
CHLORIDE: 101 meq/L (ref 96–112)
CO2: 23 mEq/L (ref 19–32)
Creatinine, Ser: 2.85 mg/dL — ABNORMAL HIGH (ref 0.50–1.35)
GFR calc Af Amer: 22 mL/min — ABNORMAL LOW (ref >90)
GFR calc non Af Amer: 19 mL/min — ABNORMAL LOW (ref >90)
GLUCOSE: 149 mg/dL — AB (ref 70–99)
POTASSIUM: 5.4 meq/L — AB (ref 3.7–5.3)
SODIUM: 138 meq/L (ref 137–147)

## 2013-07-18 LAB — CBC
HCT: 25 % — ABNORMAL LOW (ref 39.0–52.0)
Hemoglobin: 8.3 g/dL — ABNORMAL LOW (ref 13.0–17.0)
MCH: 30.7 pg (ref 26.0–34.0)
MCHC: 33.2 g/dL (ref 30.0–36.0)
MCV: 92.6 fL (ref 78.0–100.0)
Platelets: 147 K/uL — ABNORMAL LOW (ref 150–400)
RBC: 2.7 MIL/uL — ABNORMAL LOW (ref 4.22–5.81)
RDW: 13.6 % (ref 11.5–15.5)
WBC: 15.1 K/uL — ABNORMAL HIGH (ref 4.0–10.5)

## 2013-07-18 LAB — GLUCOSE, CAPILLARY
GLUCOSE-CAPILLARY: 111 mg/dL — AB (ref 70–99)
Glucose-Capillary: 132 mg/dL — ABNORMAL HIGH (ref 70–99)
Glucose-Capillary: 113 mg/dL — ABNORMAL HIGH (ref 70–99)
Glucose-Capillary: 143 mg/dL — ABNORMAL HIGH (ref 70–99)

## 2013-07-18 LAB — PROTIME-INR
INR: 1.29 (ref 0.00–1.49)
PROTHROMBIN TIME: 15.8 s — AB (ref 11.6–15.2)

## 2013-07-18 MED ORDER — SODIUM CHLORIDE 0.9 % IV SOLN
INTRAVENOUS | Status: DC
  Administered 2013-07-18 – 2013-07-19 (×4): via INTRAVENOUS

## 2013-07-18 MED ORDER — ASPIRIN EC 325 MG PO TBEC
325.0000 mg | DELAYED_RELEASE_TABLET | Freq: Every day | ORAL | Status: DC
  Administered 2013-07-18 – 2013-07-21 (×4): 325 mg via ORAL
  Filled 2013-07-18 (×4): qty 1

## 2013-07-18 NOTE — Op Note (Signed)
NAMEDIMA, FERRUFINO NO.:  [id number]  MEDICAL RECORD NO.:  [id number]  LOCATION:  32                         FACILITY:  [facility]  PHYSICIAN:  Kipp Brood. Cher Egnor, M.D.DATE OF BIRTH:  [birth year]  DATE OF PROCEDURE:  07/17/2013 DATE OF DISCHARGE:                              OPERATIVE REPORT   SURGEON:  Kipp Brood. Gladstone Lighter, M.D.  ASSISTANT:  Ardeen Jourdain, [place]  PREOPERATIVE DIAGNOSIS:  Displaced complex femoral neck fracture, left hip.  Note, this gentleman was a very large.  POSTOPERATIVE DIAGNOSIS:  Displaced complex femoral neck fracture, left hip.  Note, this gentleman was a very large.  OPERATION: 1. Excision of a left femoral head. 2. Unipolar prosthesis, left hip.  The femoral stem was a standard     stem size 10 Tri-Lock stem.  The ball was a 54-mm ball with a +0     neck.  PROCEDURE:  Under general anesthesia, routine orthopedic prep and draping of the left hip was carried out.  I first did an initial prep followed by a sterile prep.  The appropriate time-out was first carried out.  I also marked the appropriate left hip in the holding area.  I did call his family who was not present at the hospital.  I explained to daughter, got the consent over phone from his daughter exactly what we are going to do and I discussed the procedure, her name is Salome Holmes. After prepping and draping, time-out etc. as I mentioned above, the posterolateral approach to the hip was carried out.  He had 2 g of IV Ancef first.  Bleeders were identified and cauterized.  At this time, identified the iliotibial band.  After we inserted self-retaining retractors, incision was made in the band.  Following that, I then partially detached the external rotators.  Note, the femoral head was fractured high up on the neck.  We went down and opened the capsule and removed the femoral head measured had to be a 54 mm in diameter.  At that time, we then utilized the box osteotome  followed by the widening reamer followed by the canal finder.  Once we established the canal, we thoroughly irrigated out the canal, and then rasped the canal up to a size 10 Tri-Lock stem.  We then put a packing in a canal, later removed that.  We then went down, directed attention to the hip into the acetabulum.  I removed some of the anterior capsule at this point.  We cleaned out the acetabulum to make sure no fragments present.  We then did a trial test ball into the acetabulum 54 mm that fit anatomical.  We then removed that and went through the trials and selected a +0 neck, standard offset Tri-Lock stem, and a 54-mm ball.  The trials were removed.  We irrigated the femoral canal again.  We inserted our permanent size 10 Tri-Lock stem at the appropriate anteversion.  I then inserted my +0 neck and 54-mm ball attached that to the femoral to the Tri-Lock stem.  Reduced the hip after we made sure there was no loose tissue in the acetabulum.  We had excellent leg lengths, excellent range of  motion in the hip was stable.  I then reapproximated the capsule in usual fashion with #1 Vicryl.  The remaining part of the wound was closed in usual fashion.  The subcu was closed then with a running locking Tri-Lock subcu suture.  Before we did the main soft tissue closures, I injected a mixture of 20 mL of Exparel and normal saline part of that was injected deep.  We inserted some thrombin-soaked Gelfoam as we came out closing the wound, we injected the remaining part of the mixture of saline and Exparel.  Sterile dressings were applied.          ______________________________ Kipp Brood Gladstone Lighter, M.D.     RAG/MEDQ  D:  07/17/2013  T:  07/18/2013  Job:  [id number]

## 2013-07-18 NOTE — Progress Notes (Signed)
Clinical Social Work Department BRIEF PSYCHOSOCIAL ASSESSMENT 07/18/2013  Patient:  Kurt [name], Kurt [name]     Account Number:  [id number]     Admit date:  07/16/2013  Clinical Social Worker:  Lacie Scotts  Date/Time:  07/18/2013 03:03 PM  Referred by:  Physician  Date Referred:  07/18/2013 Referred for  SNF Placement   Other Referral:   Interview type:  Family Other interview type:    PSYCHOSOCIAL DATA Living Status:  FACILITY Admitted from facility:  [facility] Level of care:  Assisted Living Primary support name:  Iantha Fallen Primary support relationship to patient:  CHILD, ADULT Degree of support available:   supportive    CURRENT CONCERNS Current Concerns  Post-Acute Placement   Other Concerns:    SOCIAL WORK ASSESSMENT / PLAN Pt is an 80 yr old gentlemanan admitted from [facility] due to a fall . Pt fx his hip and had surgery on 07/17/13. CSW met with pt / family to assist with d/c planning. PT has recommended SNF placement following hospital d/c. Pt / family are in agreement with this plan. SNF search has been initiated and bed offers provided. Pt/family have chosen [facility] for rehab. [employer] has been contacted and SNF authorization has been requested.   Assessment/plan status:  Psychosocial Support/Ongoing Assessment of Needs Other assessment/ plan:   Information/referral to community resources:   Insurance coverage for SNF and ambulance transport has been reviewed.    PATIENT'S/FAMILY'S RESPONSE TO PLAN OF CARE: " My father had rehab at [facility] in the past and it was a good experience. I think he'll do well there . "  Pt's mood is bright. " I'm happy. I'm always happy."   Werner Lean LCSW [phone]

## 2013-07-18 NOTE — Progress Notes (Signed)
Clinical Social Work Department CLINICAL SOCIAL WORK PLACEMENT NOTE 07/18/2013  Patient:  MOXON, MESSLER  Account Number:  [id number] Admit date:  07/16/2013  Clinical Social Worker:  Werner Lean, LCSW  Date/time:  07/18/2013 03:23 PM  Clinical Social Work is seeking post-discharge placement for this patient at the following level of care:   SKILLED NURSING   (*CSW will update this form in Epic as items are completed)   07/18/2013  Patient/family provided with [place] Department of Clinical Social Work's list of facilities offering this level of care within the geographic area requested by the patient (or if unable, by the patient's family).  07/18/2013  Patient/family informed of their freedom to choose among providers that offer the needed level of care, that participate in Medicare, Medicaid or managed care program needed by the patient, have an available bed and are willing to accept the patient.    Patient/family informed of [facility]' ownership interest in [facility], as well as of the fact that they are under no obligation to receive care at this facility.  PASARR submitted to EDS on 07/18/2013 PASARR number received from EDS on 07/18/2013  FL2 transmitted to all facilities in geographic area requested by pt/family on  07/18/2013 FL2 transmitted to all facilities within larger geographic area on   Patient informed that his/her managed care company has contracts with or will negotiate with  certain facilities, including the following:     Patient/family informed of bed offers received:  07/18/2013 Patient chooses bed at [facility] Physician recommends and patient chooses bed at    Patient to be transferred to [facility] on   Patient to be transferred to facility by   The following physician request were entered in Epic:   Additional Comments:  Werner Lean LCSW [phone]

## 2013-07-18 NOTE — Progress Notes (Signed)
Subjective: 1 Day Post-Op Procedure(s) (LRB): ARTHROPLASTY BIPOLAR HIP (Left) Patient reports pain as 2 on 0-10 scale. Case discussed with his daughter and he will go to a SNF. He previously had a brain tumor and is not very active.Hbg 8.3. Will monitor it. Plan on SNF Tuesday or Wed.   Objective: Vital signs in last 24 hours: Temp:  [97.1 F (36.2 C)-98.9 F (37.2 C)] 97.5 F (36.4 C) (02/09 0520) Pulse Rate:  [56-105] 63 (02/09 0520) Resp:  [0-22] 20 (02/09 0520) BP: (102-202)/(64-88) 109/68 mmHg (02/09 0520) SpO2:  [90 %-100 %] 100 % (02/09 0520)  Intake/Output from previous day: 02/08 0701 - 02/09 0700 In: 2983.8 [P.O.:480; I.V.:2353.8; IV Piggyback:50] Out: 575 [Urine:375; Blood:200] Intake/Output this shift:     Recent Labs  07/16/13 2150 07/17/13 0452 07/18/13 0400  HGB 11.6* 10.7* 8.3*    Recent Labs  07/17/13 0452 07/18/13 0400  WBC 9.4 15.1*  RBC 3.48* 2.70*  HCT 31.8* 25.0*  PLT 138* 147*    Recent Labs  07/17/13 0452 07/18/13 0400  NA 137 138  K 4.7 5.4*  CL 102 101  CO2 26 23  BUN 34* 44*  CREATININE 1.88* 2.85*  GLUCOSE 122* 149*  CALCIUM 9.4 8.8    Recent Labs  07/16/13 2150 07/18/13 0400  INR 1.08 1.29    Dorsiflexion/Plantar flexion intact No cellulitis present  Assessment/Plan: 1 Day Post-Op Procedure(s) (LRB): ARTHROPLASTY BIPOLAR HIP (Left) Up with therapy. SNF. Monitor Hbg.  Areg Bialas A 07/18/2013, 7:20 AM

## 2013-07-18 NOTE — Progress Notes (Signed)
[facility] PROGRESS NOTE  Kurt [name] [id number] DOB: 12-20-1928 DOA: 07/16/2013 PCP: [name], MD  Assessment/Plan: 1.Left Femoral fracture:  -Per orthopedics, status post surgery per [name]   -Continue pain management  2.HTN: hemodynamically stable. Continue home meds.  3.Seizure d/o: clinically stable. Continue home meds.  4.DM: SSI. A1C.  5.Anemia: baseline hgb 11-12. Near baseline. Likely ACD. Continue to follow.  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  -Creatinine trending up this a.m., increase IV fluids -follow recheck in a.m. 7.Dementia: continue namenda.  8. Leukocytosis -Obtain chest x-ray and UA follow and treat -Follow and recheck CBC in a.m.  Code Status: DO NOT RESUSCITATE Family Communication: Friend and 2 daughters at bedside Disposition Plan: Pending clinical course   Consultants:  Orthopedics  Procedures:  Status post left arthroplasty bipolar hip  Antibiotics:  Cefazolin  HPI/Subjective: Denies any new complaints, but her by mouth intake today  Objective: Filed Vitals:   07/18/13 1329  BP: 113/58  Pulse: 66  Temp: 99.7 F (37.6 C)  Resp: 20    Intake/Output Summary (Last 24 hours) at 07/18/13 1900 Last data filed at 07/18/13 1851  Gross per 24 hour  Intake   2375 ml  Output    365 ml  Net   2010 ml   Filed Weights   07/16/13 2054  Weight: 89.812 kg (198 lb)    Exam:  General: alert & answers appropriately In NAD Cardiovascular: RRR, nl S1 s2 Respiratory: CTAB Abdomen: soft +BS NT/ND, no masses palpable Extremities: No cyanosis and no edema    Data Reviewed: Basic Metabolic Panel:  Recent Labs Lab 07/16/13 2150 07/17/13 0452 07/18/13 0400  NA 140 137 138  K 4.5 4.7 5.4*  CL 102 102 101  CO2 28 26 23   GLUCOSE 105* 122* 149*  BUN 36* 34* 44*  CREATININE 2.09* 1.88* 2.85*  CALCIUM 10.0 9.4 8.8   Liver Function Tests:  Recent Labs Lab 07/17/13 0452  AST 13  ALT 15  ALKPHOS 62  BILITOT 0.3   PROT 6.4  ALBUMIN 3.0*   No results found for this basename: LIPASE, AMYLASE,  in the last 168 hours No results found for this basename: AMMONIA,  in the last 168 hours CBC:  Recent Labs Lab 07/16/13 2150 07/17/13 0452 07/18/13 0400 07/18/13 1550  WBC 5.4 9.4 15.1* 11.9*  NEUTROABS 3.2  --   --  7.5  HGB 11.6* 10.7* 8.3* 7.3*  HCT 34.0* 31.8* 25.0* 21.5*  MCV 92.6 91.4 92.6 92.3  PLT 138* 138* 147* 120*   Cardiac Enzymes: No results found for this basename: CKTOTAL, CKMB, CKMBINDEX, TROPONINI,  in the last 168 hours BNP (last 3 results) No results found for this basename: PROBNP,  in the last 8760 hours CBG:  Recent Labs Lab 07/17/13 1903 07/17/13 2049 07/18/13 0718 07/18/13 1140 07/18/13 1639  GLUCAP 149* 141* 132* 113* 111*    Recent Results (from the past 240 hour(s))  SURGICAL PCR SCREEN     Status: None   Collection Time    07/17/13  1:13 AM      Result Value Range Status   MRSA, PCR NEGATIVE  NEGATIVE Final   Staphylococcus aureus NEGATIVE  NEGATIVE Final   Comment:            The Xpert SA Assay (FDA     approved for NASAL specimens     in patients over 65 years of age),     is one component of  a comprehensive surveillance     program.  Test performance has     been validated by [facility] for patients greater     than or equal to 78 year old.     It is not intended     to diagnose infection nor to     guide or monitor treatment.     Studies: Dg Chest 1 View  07/18/2013   CLINICAL DATA:  Status post left hip arthroplasty for fracture.  EXAM: CHEST - 1 VIEW  COMPARISON:  DG RIBS UNILATERAL W/CHEST*R* dated 05/02/2013; DG CHEST 1V PORT dated 02/22/2013; DG KNEE1- 2 VIEWS*L* dated 07/16/2013; DG HIP COMPLETE*L* dated 07/16/2013  FINDINGS: The heart size is normal. The aorta is ectatic. There is no evidence of pulmonary edema, consolidation, pneumothorax, nodule or pleural fluid.  IMPRESSION: No active disease.   Electronically Signed   By: Aletta Edouard M.D.   On: 07/18/2013 16:08   Dg Hip Complete Left  07/16/2013   CLINICAL DATA:  Left hip pain after fall.  EXAM: LEFT HIP - COMPLETE 2+ VIEW  COMPARISON:  None available for comparison at time of study interpretation.  FINDINGS: Left subcapital femur fracture with varus angulation distal bony fragments, impaction. Femoral head is well formed and located. No dislocation.  Bone mineral density is decreased without destructive bony lesions. Periarticular soft tissue planes are nonsuspicious.  IMPRESSION: Angulated impacted left subcapital femur fracture without dislocation.   Electronically Signed   By: Elon Alas   On: 07/16/2013 22:36   Dg Knee 1-2 Views Left  07/16/2013   CLINICAL DATA:  Fall with left knee pain.  EXAM: LEFT KNEE - 1-2 VIEW  COMPARISON:  None.  FINDINGS: Oblique images were not obtained due to limited patient mobility because of a hip fracture.  Bones are osteopenic. There is no evidence of acute fracture or dislocation. No joint effusion is seen. Patellar enthesophytes are present. Medial and lateral compartment joint spaces are maintained. Vascular calcifications are present.  IMPRESSION: No evidence of acute osseous abnormality involving the left knee.   Electronically Signed   By: Logan Bores   On: 07/16/2013 22:38   Dg Pelvis Portable  07/17/2013   CLINICAL DATA:  Surgery for a left femoral neck fracture.  EXAM: PORTABLE PELVIS 1-2 VIEWS  COMPARISON:  None.  FINDINGS: Left hip prosthesis in satisfactory position and alignment. No fracture or dislocation seen on a single frontal view. Diffuse osteopenia.  IMPRESSION: Satisfactory postoperative appearance of a left hip prosthesis.   Electronically Signed   By: Enrique Sack M.D.   On: 07/17/2013 13:30    Scheduled Meds: . amLODipine  2.5 mg Oral Daily  . antiseptic oral rinse  15 mL Mouth Rinse q12n4p  . aspirin EC  325 mg Oral Daily  . atenolol  50 mg Oral Daily  . cholecalciferol  400 Units Oral Daily  .  divalproex  125 mg Oral Q24H  . divalproex  250 mg Oral Custom  . ferrous sulfate  325 mg Oral TID PC  . finasteride  5 mg Oral Daily  . insulin aspart  0-15 Units Subcutaneous TID WC  . levETIRAcetam  500 mg Oral Q12H  . Memantine HCl ER  28 mg Oral Daily  . multivitamin with minerals  1 tablet Oral Daily  . pantoprazole  40 mg Oral Daily  . simvastatin  20 mg Oral QPM   Continuous Infusions: . sodium chloride 75 mL/hr at 07/18/13 0946  .  lactated ringers 75 mL/hr at 07/17/13 1930    Active Problems:   Hip fracture   Femoral fracture   Fracture of femoral neck, left   Acute blood loss anemia    Time spent: [facility] Pager [phone]. If 7PM-7AM, please contact night-coverage at [email or web address], password [facility] 07/18/2013, 7:00 PM  LOS: 2 days

## 2013-07-18 NOTE — Evaluation (Signed)
Physical Therapy Evaluation Patient Details Name: Kurt [name] MRN: [id number] DOB: September 09, 1928 Today's Date: 07/18/2013 Time: 1019-1050 PT Time Calculation (min): 31 min  PT Assessment / Plan / Recommendation History of Present Illness  Pt fell at at [facility] and sustained a L hip fracture.  Pt did undergo L hip surgery in which pt is PWB and does have total hip precautions  Clinical Impression  **Pt admitted with L hip fx, s/p bipolararthoplasty*.  Pt currently with functional limitations due to the deficits listed below (see PT Problem List).  Pt will benefit from skilled PT to increase their independence and safety with mobility to allow discharge to the venue listed below.   *    PT Assessment  Patient needs continued PT services    Follow Up Recommendations  SNF    Does the patient have the potential to tolerate intense rehabilitation      Barriers to Discharge        Equipment Recommendations  None recommended by PT    Recommendations for Other Services OT consult   Frequency Min 3X/week    Precautions / Restrictions Precautions Precautions: Posterior Hip Precaution Comments: Instructed pt in posterior hip precautions, sign posted in room Restrictions Weight Bearing Restrictions: Yes LLE Weight Bearing: Partial weight bearing   Pertinent Vitals/Pain *Pt unable to rate pain, but grimaced with ROM to L hip Repositioned, ice pack applied**      Mobility  Bed Mobility Overal bed mobility: +2 for physical assistance;Needs Assistance Bed Mobility: Supine to Sit;Sit to Supine Supine to sit: +2 for physical assistance;HOB elevated Sit to supine: +2 for physical assistance General bed mobility comments: pt 10%, assist to raise trunk and advance BLEs Transfers Overall transfer level: Needs assistance Equipment used: Rolling walker (2 wheeled) Transfers: Sit to/from Stand Sit to Stand: From elevated surface;+2 physical assistance General transfer comment: pt 15%, sit to  stand x 2 trials, pt unable to stand fully upright with RW and +2 assist    Exercises Total Joint Exercises Ankle Circles/Pumps: AROM;Both;10 reps;Supine Heel Slides: AAROM;Left;10 reps;Supine Hip ABduction/ADduction: AAROM;Left;10 reps;Supine   PT Diagnosis: Difficulty walking;Generalized weakness;Acute pain  PT Problem List: Decreased strength;Decreased range of motion;Decreased activity tolerance;Decreased mobility;Decreased balance;Decreased knowledge of precautions PT Treatment Interventions: DME instruction;Gait training;Functional mobility training;Therapeutic activities;Therapeutic exercise;Balance training;Patient/family education     PT Goals(Current goals can be found in the care plan section) Acute Rehab PT Goals Patient Stated Goal: to walk PT Goal Formulation: With patient Time For Goal Achievement: 08/01/13 Potential to Achieve Goals: Fair  Visit Information  Last PT Received On: 07/18/13 Assistance Needed: +2 Reason for Co-Treatment: For patient/therapist safety PT goals addressed during session: Mobility/safety with mobility;Strengthening/ROM;Balance OT goals addressed during session: ADL's and self-care History of Present Illness: Pt fell at at [facility] and sustained a L hip fracture.  Pt did undergo L hip surgery in which pt is PWB and does have total hip precautions       Prior [place] expects to be discharged to:: Skilled nursing facility Additional Comments: pt was in ALF level care before adm Prior Function Level of Independence: Independent with assistive device(s) Comments: Pt stated he walked with a RW at ALF. No family present to confirm.    Cognition  Cognition Arousal/Alertness: Lethargic (awake initially, but lethargic near end of session) Behavior During Therapy: WFL for tasks assessed/performed Overall Cognitive Status: No family/caregiver present to determine baseline cognitive functioning (slow to respond at times  but did well following directions) Memory:  Decreased recall of precautions    Extremity/Trunk Assessment Upper Extremity Assessment Upper Extremity Assessment: Generalized weakness Lower Extremity Assessment Lower Extremity Assessment: LLE deficits/detail;RLE deficits/detail RLE Deficits / Details: knee ext 2/5 in sitting, pt had some difficulty following directions, so not clear if 2/5 is accurate LLE Deficits / Details: ankle WFL, pt tolerated AAROM to L hip/knee, generalized weakness L hip/knee due to pain Cervical / Trunk Assessment Cervical / Trunk Assessment: Kyphotic   Balance Balance Overall balance assessment: Needs assistance Sitting-balance support: Bilateral upper extremity supported Sitting balance-Leahy Scale: Poor Postural control: Other (comment) (forward lean in sitting)  End of Session PT - End of Session Equipment Utilized During Treatment: Gait belt;Left knee immobilizer Activity Tolerance: Patient limited by lethargy Patient left: in bed;with call bell/phone within reach Nurse Communication: Mobility status;Need for lift equipment  GP     Philomena Doheny 07/18/2013, 11:19 AM [phone]

## 2013-07-18 NOTE — Progress Notes (Addendum)
ANTICOAGULATION CONSULT NOTE - Initial Consult  Pharmacy Consult for Coumadin  -- Changed to ASA Indication: VTE prophylaxis  No Known Allergies  Patient Measurements: Height: 6\' 2"  (188 cm) Weight: 198 lb (89.812 kg) IBW/kg (Calculated) : 82.2  Vital Signs: Temp: 97.5 F (36.4 C) (02/09 0520) Temp src: Oral (02/09 0520) BP: 109/68 mmHg (02/09 0520) Pulse Rate: 63 (02/09 0520)  Labs:  Recent Labs  07/16/13 2150 07/17/13 0452 07/18/13 0400  HGB 11.6* 10.7* 8.3*  HCT 34.0* 31.8* 25.0*  PLT 138* 138* 147*  LABPROT 13.8  --  15.8*  INR 1.08  --  1.29  CREATININE 2.09* 1.88* 2.85*    Estimated Creatinine Clearance: 23.2 ml/min (by C-G formula based on Cr of 2.85).   Medical History: Past Medical History  Diagnosis Date  . Hypertension   . Hyperlipidemia   . Seizure disorder   . CKD (chronic kidney disease), stage III   . Altered mental state 08/31/2012    "came into hospital a little disoriented; it's gotten worse; this is not normal" (08/31/2012)  . Type II diabetes mellitus   . Chronic lower back pain   . Frequent falls   . brain tumor dx'd 2002    surg only; refused xrt last yr per family member  . DDD (degenerative disc disease), lumbar   . Dementia   . Seizure disorder     with left leg weakness (Todd's phenomenon)  . Dysphagia   . Assistance needed for mobility     walks with walker  . Sialoadenitis     Medications:  Prescriptions prior to admission  Medication Sig Dispense Refill  . amLODipine (NORVASC) 2.5 MG tablet Take 2.5 mg by mouth daily.      Marland Kitchen aspirin EC 81 MG tablet Take 81 mg by mouth daily.      Marland Kitchen atenolol (TENORMIN) 50 MG tablet Take 50 mg by mouth daily.      . cholecalciferol (VITAMIN D) 400 UNITS TABS tablet Take 400 Units by mouth daily.      . divalproex (DEPAKOTE SPRINKLE) 125 MG capsule Take 125-250 mg by mouth 4 (four) times daily. Take 2 capsules by mouth at 8AM, then Take 1 capsule by mouth at noon, 4pm, and 8pm      . ferrous  sulfate 325 (65 FE) MG tablet Take 325 mg by mouth daily with breakfast.      . finasteride (PROSCAR) 5 MG tablet Take 5 mg by mouth daily.      Marland Kitchen levETIRAcetam (KEPPRA) 500 MG tablet Take 500 mg by mouth every 12 (twelve) hours.      Marland Kitchen LORazepam (ATIVAN) 1 MG tablet Take 1 mg by mouth every 6 (six) hours as needed for anxiety.       . Memantine HCl ER (NAMENDA XR) 28 MG CP24 Take 1 capsule by mouth daily.      . Multiple Vitamin (MULTIVITAMIN WITH MINERALS) TABS tablet Take 1 tablet by mouth daily.      . pantoprazole (PROTONIX) 40 MG tablet Take 40 mg by mouth daily.      . simvastatin (ZOCOR) 20 MG tablet Take 20 mg by mouth every evening.      . Memantine HCl ER (NAMENDA XR) 14 MG CP24 Take 1 capsule by mouth daily. For 7 days        Assessment:  80yo M with dementia fell at home and broke his hip on 2/6. Underwent L bipolar hip arthroplasty on 2/8. Pharmacy is asked to start  Coumadin for VTE prophylaxis.   INR with bump to 1.29 from 1.08 after 1 dose of warfarin.  This is a quick increase for initiation of warfarin.  Will reduce dose tonight  Concerned in 80 yo with history of dementia, patient may be at increased risk of continued falls outpatient.  Lovenox not a great option secondary to renal dysfunction.  Discussed with [name] this morning with decision to change VTE prophylaxis to SCD's and Aspirin   Goal of Therapy:  INR 2-3 Monitor platelets by anticoagulation protocol: Yes   Plan:   Per Hip Fracture Post op ortho order set/CHEST guidelines, okay to use Aspirin 325 mg PO daily + SCD's for VTE prophylaxis.  Discussed high risk of bleeding in this patient with orders to change patient to ASA + SCD's.   D/c daily INR's   Pharmacy Will sign-off, please re-consult if needed   [place], Gaye Alken PharmD Pager #: [phone] 8:30 AM 07/18/2013

## 2013-07-18 NOTE — Evaluation (Signed)
Occupational Therapy Evaluation Patient Details Name: Kurt [name] MRN: [id number] DOB: June 11, 1928 Today's Date: 07/18/2013 Time: 7829-5621 OT Time Calculation (min): 23 min  OT Assessment / Plan / Recommendation History of present illness Pt fell at at [facility] and sustained a L hip fracture.  Pt did undergo L hip surgery in which pt is PWB and does have total hip precautions   Clinical Impression   Pt presents to OT with decreased I with all ADL activity. Pt will benefit from skilled OT to increase I with ADL activity and return to PLOF    OT Assessment  Patient needs continued OT Services    Follow Up Recommendations  SNF       Equipment Recommendations  None recommended by OT       Frequency  Min 2X/week    Precautions / Restrictions Precautions Precautions: Posterior Hip Precaution Comments: Instructed pt in posterior hip precautions, sign posted in room Restrictions Weight Bearing Restrictions: Yes LLE Weight Bearing: Partial weight bearing       ADL  Grooming: Minimal assistance Where Assessed - Grooming: Supported sitting Upper Body Bathing: Maximal assistance Where Assessed - Upper Body Bathing: Supported sitting Lower Body Bathing: +2 Total assistance Where Assessed - Lower Body Bathing: Supported sit to stand Upper Body Dressing: Maximal assistance Where Assessed - Upper Body Dressing: Supported sitting Lower Body Dressing: +2 Total assistance Where Assessed - Lower Body Dressing: Supported sit to Lobbyist Method: Sit to stand Toileting - Water quality scientist and Hygiene: +2 Total assistance ADL Comments: Did not reach full stand with PT and OT assisting.  Pt slow to respond at times and did not seem to help with mobility very much.  Question if this is patients baseline.   No family present    OT Diagnosis: Generalized weakness  OT Problem List: Decreased activity tolerance;Decreased strength OT Treatment Interventions: Self-care/ADL  training;Patient/family education;DME and/or AE instruction   OT Goals(Current goals can be found in the care plan section) Acute Rehab OT Goals Patient Stated Goal: to walk OT Goal Formulation: With patient Time For Goal Achievement: 08/01/13 Potential to Achieve Goals: Good  Visit Information  Last OT Received On: 07/18/13 Assistance Needed: +2 PT/OT/SLP Co-Evaluation/Treatment: Yes Reason for Co-Treatment: For patient/therapist safety PT goals addressed during session: Mobility/safety with mobility;Strengthening/ROM;Balance OT goals addressed during session: ADL's and self-care History of Present Illness: Pt fell at at [facility] and sustained a L hip fracture.  Pt did undergo L hip surgery in which pt is PWB and does have total hip precautions       Prior [place] expects to be discharged to:: Skilled nursing facility Additional Comments: pt was in ALF level care before adm Prior Function Level of Independence: Independent with assistive device(s) Comments: Pt stated he walked with a RW at ALF. No family present to confirm.         Vision/Perception Vision - History Baseline Vision: Wears glasses all the time   Cognition  Cognition Arousal/Alertness: Lethargic (awake initially, but lethargic near end of session) Behavior During Therapy: WFL for tasks assessed/performed Overall Cognitive Status: No family/caregiver present to determine baseline cognitive functioning (slow to respond at times but did well following directions) Memory: Decreased recall of precautions    Extremity/Trunk Assessment Upper Extremity Assessment Upper Extremity Assessment: Generalized weakness Lower Extremity Assessment Lower Extremity Assessment: LLE deficits/detail;RLE deficits/detail RLE Deficits / Details: knee ext 2/5 in sitting, pt had some difficulty following directions, so not clear if  2/5 is accurate LLE Deficits / Details: ankle WFL, pt tolerated  AAROM to L hip/knee, generalized weakness L hip/knee due to pain Cervical / Trunk Assessment Cervical / Trunk Assessment: Kyphotic     Mobility Bed Mobility Overal bed mobility: +2 for physical assistance;Needs Assistance Bed Mobility: Supine to Sit;Sit to Supine Supine to sit: +2 for physical assistance;HOB elevated Sit to supine: +2 for physical assistance General bed mobility comments: pt 10%, assist to raise trunk and advance BLEs Transfers Overall transfer level: Needs assistance Equipment used: Rolling walker (2 wheeled) Transfers: Sit to/from Stand Sit to Stand: From elevated surface;+2 physical assistance General transfer comment: pt 15%, sit to stand x 2 trials, pt unable to stand fully upright with RW and +2 assist     Exercise Total Joint Exercises Ankle Circles/Pumps: AROM;Both;10 reps;Supine Heel Slides: AAROM;Left;10 reps;Supine Hip ABduction/ADduction: AAROM;Left;10 reps;Supine   Balance Balance Overall balance assessment: Needs assistance Sitting-balance support: Bilateral upper extremity supported Sitting balance-Leahy Scale: Poor Postural control: Other (comment) (forward lean in sitting)   End of Session OT - End of Session Equipment Utilized During Treatment: Rolling walker;Gait belt Activity Tolerance: Patient limited by fatigue Patient left: in bed;with call bell/phone within reach Nurse Communication: Mobility status;Need for lift equipment;Weight bearing status  GO     Kurt [name], Thereasa Parkin 07/18/2013, 11:41 AM

## 2013-07-19 ENCOUNTER — Inpatient Hospital Stay (HOSPITAL_COMMUNITY): Payer: Medicare Other

## 2013-07-19 DIAGNOSIS — N179 Acute kidney failure, unspecified: Secondary | ICD-10-CM

## 2013-07-19 LAB — GLUCOSE, CAPILLARY
GLUCOSE-CAPILLARY: 118 mg/dL — AB (ref 70–99)
GLUCOSE-CAPILLARY: 123 mg/dL — AB (ref 70–99)
Glucose-Capillary: 116 mg/dL — ABNORMAL HIGH (ref 70–99)
Glucose-Capillary: 115 mg/dL — ABNORMAL HIGH (ref 70–99)

## 2013-07-19 LAB — BASIC METABOLIC PANEL
BUN: 58 mg/dL — AB (ref 6–23)
CHLORIDE: 102 meq/L (ref 96–112)
CO2: 22 meq/L (ref 19–32)
Calcium: 8.8 mg/dL (ref 8.4–10.5)
Creatinine, Ser: 3.18 mg/dL — ABNORMAL HIGH (ref 0.50–1.35)
GFR calc Af Amer: 19 mL/min — ABNORMAL LOW (ref >90)
GFR calc non Af Amer: 17 mL/min — ABNORMAL LOW (ref >90)
Glucose, Bld: 131 mg/dL — ABNORMAL HIGH (ref 70–99)
POTASSIUM: 4.4 meq/L (ref 3.7–5.3)
Sodium: 136 mEq/L — ABNORMAL LOW (ref 137–147)

## 2013-07-19 LAB — CBC
HCT: 20.9 % — ABNORMAL LOW (ref 39.0–52.0)
Hemoglobin: 7.2 g/dL — ABNORMAL LOW (ref 13.0–17.0)
MCH: 31.4 pg (ref 26.0–34.0)
MCHC: 34.4 g/dL (ref 30.0–36.0)
MCV: 91.3 fL (ref 78.0–100.0)
Platelets: 124 10*3/uL — ABNORMAL LOW (ref 150–400)
RBC: 2.29 MIL/uL — ABNORMAL LOW (ref 4.22–5.81)
RDW: 13.7 % (ref 11.5–15.5)
WBC: 9.6 10*3/uL (ref 4.0–10.5)

## 2013-07-19 LAB — URINE CULTURE
Colony Count: NO GROWTH
Culture: NO GROWTH

## 2013-07-19 NOTE — Progress Notes (Signed)
[facility] PROGRESS NOTE  Kurt [name] HYQ:[id number] DOB: 1928-10-23 DOA: 07/16/2013 PCP: [name], MD  Assessment/Plan: 1.Left Femoral fracture:  -Per orthopedics, status post surgery per [name]   -Continue pain management  -To SNF when medically ready 2.HTN: hemodynamically stable. Continue home meds.  3.Seizure d/o: clinically stable. Continue home meds.  4.DM: SSI. A1C.  5.Anemia: Hemoglobin trended down with hydration in the hospital but stable at 7.2 today -Follow and recheck in a.m. 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  -Creatinine continuing to trend up today -Will obtain renal ultrasound and follow -Continue IV fluids follow and recheck -follow recheck in a.m. 7.Dementia: continue namenda.  8. Leukocytosis -UA and chest x-ray are remarkable for infection -Resolved, was likely reactive   Code Status: DO NOT RESUSCITATE Family Communication: None at bedside Disposition Plan: To SNF when medically ready  Consultants:  Orthopedics  Procedures:  Status post left arthroplasty bipolar hip  Renal ultrasound pending  Antibiotics:  Cefazolin on 2/8>2/8  HPI/Subjective: Sitting up in chair, denies chest pain and shortness of breath  Objective: Filed Vitals:   07/19/13 1414  BP: 152/80  Pulse: 55  Temp: 97.3 F (36.3 C)  Resp: 16    Intake/Output Summary (Last 24 hours) at 07/19/13 1857 Last data filed at 07/19/13 1800  Gross per 24 hour  Intake 1451.67 ml  Output   1900 ml  Net -448.33 ml   Filed Weights   07/16/13 2054  Weight: 89.812 kg (198 lb)    Exam:  General: alert & answers appropriately In NAD Cardiovascular: RRR, nl S1 s2 Respiratory: CTAB Abdomen: soft +BS NT/ND, no masses palpable Extremities: No cyanosis and no edema    Data Reviewed: Basic Metabolic Panel:  Recent Labs Lab 07/16/13 2150 07/17/13 0452 07/18/13 0400 07/19/13 0419  NA 140 137 138 136*  K 4.5 4.7 5.4* 4.4  CL 102 102 101 102  CO2 28 26 23 22    GLUCOSE 105* 122* 149* 131*  BUN 36* 34* 44* 58*  CREATININE 2.09* 1.88* 2.85* 3.18*  CALCIUM 10.0 9.4 8.8 8.8   Liver Function Tests:  Recent Labs Lab 07/17/13 0452  AST 13  ALT 15  ALKPHOS 62  BILITOT 0.3  PROT 6.4  ALBUMIN 3.0*   No results found for this basename: LIPASE, AMYLASE,  in the last 168 hours No results found for this basename: AMMONIA,  in the last 168 hours CBC:  Recent Labs Lab 07/16/13 2150 07/17/13 0452 07/18/13 0400 07/18/13 1550 07/19/13 0419  WBC 5.4 9.4 15.1* 11.9* 9.6  NEUTROABS 3.2  --   --  7.5  --   HGB 11.6* 10.7* 8.3* 7.3* 7.2*  HCT 34.0* 31.8* 25.0* 21.5* 20.9*  MCV 92.6 91.4 92.6 92.3 91.3  PLT 138* 138* 147* 120* 124*   Cardiac Enzymes: No results found for this basename: CKTOTAL, CKMB, CKMBINDEX, TROPONINI,  in the last 168 hours BNP (last 3 results) No results found for this basename: PROBNP,  in the last 8760 hours CBG:  Recent Labs Lab 07/18/13 1639 07/18/13 2108 07/19/13 0715 07/19/13 1148 07/19/13 1728  GLUCAP 111* 143* 116* 118* 123*    Recent Results (from the past 240 hour(s))  SURGICAL PCR SCREEN     Status: None   Collection Time    07/17/13  1:13 AM      Result Value Range Status   MRSA, PCR NEGATIVE  NEGATIVE Final   Staphylococcus aureus NEGATIVE  NEGATIVE Final   Comment:  The Xpert SA Assay (FDA     approved for NASAL specimens     in patients over 46 years of age),     is one component of     a comprehensive surveillance     program.  Test performance has     been validated by [employer] for patients greater     than or equal to 67 year old.     It is not intended     to diagnose infection nor to     guide or monitor treatment.  URINE CULTURE     Status: None   Collection Time    07/18/13  3:52 PM      Result Value Range Status   Specimen Description URINE, RANDOM   Final   Special Requests NONE   Final   Culture  Setup Time     Final   Value: 07/18/2013 21:01      Performed at [place]     Final   Value: NO GROWTH     Performed at [facility]   Culture     Final   Value: NO GROWTH     Performed at [facility]   Report Status 07/19/2013 FINAL   Final     Studies: Dg Chest 1 View  07/18/2013   CLINICAL DATA:  Status post left hip arthroplasty for fracture.  EXAM: CHEST - 1 VIEW  COMPARISON:  DG RIBS UNILATERAL W/CHEST*R* dated 05/02/2013; DG CHEST 1V PORT dated 02/22/2013; DG KNEE1- 2 VIEWS*L* dated 07/16/2013; DG HIP COMPLETE*L* dated 07/16/2013  FINDINGS: The heart size is normal. The aorta is ectatic. There is no evidence of pulmonary edema, consolidation, pneumothorax, nodule or pleural fluid.  IMPRESSION: No active disease.   Electronically Signed   By: Aletta Edouard M.D.   On: 07/18/2013 16:08   US Renal  07/19/2013   CLINICAL DATA:  Worsening creatinine.  EXAM: RENAL/URINARY TRACT ULTRASOUND COMPLETE  COMPARISON:  09/27/2012  FINDINGS: Right Kidney:  Length: 13.5 cm. There multiple cysts. Normal parenchymal echogenicity. No solid renal masses. Largest cyst arises from the lower pole measuring 9.2 cm in greatest dimension. 4 cm cyst arises from the upper pole. No hydronephrosis.  Left Kidney:  Length: 11.9 cm. Normal echogenicity. Multiple cysts. There are 2 upper pole cysts, the larger measuring 5 cm with the smaller cyst just below this measuring 3.9 cm no solid renal masses, no stones and no hydronephrosis.  Bladder:  Decompressed by a Foley catheter  Incidental note is made of gallstones.  IMPRESSION: 1. Normal renal sizes and overall parenchymal echogenicity. 2. No hydronephrosis. 3. Bilateral renal cysts, essentially stable.   Electronically Signed   By: Lajean Manes M.D.   On: 07/19/2013 15:25    Scheduled Meds: . amLODipine  2.5 mg Oral Daily  . antiseptic oral rinse  15 mL Mouth Rinse q12n4p  . aspirin EC  325 mg Oral Daily  . atenolol  50 mg Oral Daily  . cholecalciferol  400 Units Oral Daily  .  divalproex  125 mg Oral Q24H  . divalproex  250 mg Oral Custom  . ferrous sulfate  325 mg Oral TID PC  . finasteride  5 mg Oral Daily  . insulin aspart  0-15 Units Subcutaneous TID WC  . levETIRAcetam  500 mg Oral Q12H  . Memantine HCl ER  28 mg Oral Daily  . multivitamin with minerals  1 tablet  Oral Daily  . pantoprazole  40 mg Oral Daily  . simvastatin  20 mg Oral QPM   Continuous Infusions: . sodium chloride 100 mL/hr at 07/19/13 0834  . lactated ringers 75 mL/hr at 07/17/13 1930    Active Problems:   Hip fracture   Femoral fracture   Fracture of femoral neck, left   Acute blood loss anemia    Time spent: [facility] Pager [phone]. If 7PM-7AM, please contact night-coverage at [email or web address], password [facility] 07/19/2013, 6:57 PM  LOS: 3 days

## 2013-07-19 NOTE — Progress Notes (Signed)
Subjective: 2 Days Post-Op Procedure(s) (LRB): ARTHROPLASTY BIPOLAR HIP (Left) Patient reports pain as 1 on 0-10 scale.No change in his status. He is incontinent of his stool. Hbg is stable at 7.2. Will follow. I called his daughter this morning and discussed his case. They have no objection if we have to Transfuse him.  No indication to transfuse him now. Plans are being made for SNF tomorrow.Case discussed with Pharmacy and they recommend Aspirin for anticoagulation. His renal function is very poor .Creatinine is 3.18 and BUN 58.  Objective: Vital signs in last 24 hours: Temp:  [98.4 F (36.9 C)-99.7 F (37.6 C)] 98.9 F (37.2 C) (02/10 0518) Pulse Rate:  [58-67] 58 (02/10 0518) Resp:  [18-20] 18 (02/10 0518) BP: (113-152)/(54-73) 152/73 mmHg (02/10 0518) SpO2:  [95 %-99 %] 95 % (02/10 0518)  Intake/Output from previous day: 02/09 0701 - 02/10 0700 In: 1746.7 [P.O.:480; I.V.:1266.7] Out: 940 [Urine:940] Intake/Output this shift:     Recent Labs  07/16/13 2150 07/17/13 0452 07/18/13 0400 07/18/13 1550 07/19/13 0419  HGB 11.6* 10.7* 8.3* 7.3* 7.2*    Recent Labs  07/18/13 1550 07/19/13 0419  WBC 11.9* 9.6  RBC 2.33* 2.29*  HCT 21.5* 20.9*  PLT 120* 124*    Recent Labs  07/18/13 0400 07/19/13 0419  NA 138 136*  K 5.4* 4.4  CL 101 102  CO2 23 22  BUN 44* 58*  CREATININE 2.85* 3.18*  GLUCOSE 149* 131*  CALCIUM 8.8 8.8    Recent Labs  07/16/13 2150 07/18/13 0400  INR 1.08 1.29    Dorsiflexion/Plantar flexion intact No cellulitis present  Assessment/Plan: 2 Days Post-Op Procedure(s) (LRB): ARTHROPLASTY BIPOLAR HIP (Left) Discharge to SNF. From Ortho standpoint he is Bed-to -chair only with full weight bearing and will be DC on Aspirin.  Atom Solivan A 07/19/2013, 7:17 AM

## 2013-07-19 NOTE — Progress Notes (Signed)
Physical Therapy Treatment Patient Details Name: Kurt [name] MRN: [id number] DOB: 06-14-1928 Today's Date: 07/19/2013 Time: 0832-0910 PT Time Calculation (min): 38 min  PT Assessment / Plan / Recommendation  History of Present Illness Pt fell while washing dishes at his ALF falling on his L side.     PT Comments   POD # 2 L Bipolar Hip 2nd fall/Fx.  More alert and responsive.  Still much delay and very slow moving.  Required + 2 total assist to get OOB to [facility] for a BM.  Assisted off BSC + 2 total asssit and attempted to use EVA walker for amb.  Very limited amb distance/tolerance.   Follow Up Recommendations  SNF     Does the patient have the potential to tolerate intense rehabilitation     Barriers to Discharge        Equipment Recommendations       Recommendations for Other Services    Frequency Min 3X/week   Progress towards PT Goals Progress towards PT goals: Progressing toward goals  Plan      Precautions / Restrictions Precautions Precautions: Posterior Hip;Fall Restrictions Weight Bearing Restrictions: Yes LLE Weight Bearing: Partial weight bearing   Pertinent Vitals/Pain     Mobility  Bed Mobility Overal bed mobility: +2 for physical assistance;Needs Assistance Bed Mobility: Supine to Sit;Sit to Supine Supine to sit: +2 for physical assistance;HOB elevated General bed mobility comments: pt 10%, assist to raise trunk and advance BLEs Transfers Overall transfer level: Needs assistance Equipment used:  (EVA walker) Transfers: Sit to/from Stand General transfer comment: used EVA walker for increased support + 2 total assist pt 15% with MAX VC's to increase self support.  Poor flex forward standing posture. Ambulation/Gait Ambulation/Gait assistance: +2 physical assistance;+2 safety/equipment Ambulation Distance (Feet): 3 Feet Assistive device:  (EVA walker) Gait Pattern/deviations: Step-to pattern;Scissoring;Shuffle;Decreased stance time - left;Decreased  step length - right Gait velocity: decrease General Gait Details: Great difficulty even with EVA walker to advance gait.  B hips and knees flex.  Chair following closley behind.     PT Goals (current goals can now be found in the care plan section)    Visit Information  Last PT Received On: 07/19/13 Assistance Needed: +2 History of Present Illness: Pt fell while washing dishes at his ALF falling on his L side.      Subjective Data      Cognition       Balance     End of Session PT - End of Session Equipment Utilized During Treatment: Gait belt Activity Tolerance: Other (comment) (decreased cognition and slow/delayed responces) Patient left: in chair;with call bell/phone within reach Nurse Communication: Need for lift equipment   Rica Koyanagi  PTA [facility]  Acute  Rehab Pager      [phone]

## 2013-07-20 DIAGNOSIS — N179 Acute kidney failure, unspecified: Secondary | ICD-10-CM

## 2013-07-20 DIAGNOSIS — D62 Acute posthemorrhagic anemia: Secondary | ICD-10-CM

## 2013-07-20 LAB — TYPE AND SCREEN
ABO/RH(D): A POS
Antibody Screen: NEGATIVE
Unit division: 0
Unit division: 0

## 2013-07-20 LAB — BASIC METABOLIC PANEL
BUN: 53 mg/dL — ABNORMAL HIGH (ref 6–23)
CHLORIDE: 105 meq/L (ref 96–112)
CO2: 22 meq/L (ref 19–32)
CREATININE: 2.31 mg/dL — AB (ref 0.50–1.35)
Calcium: 8.8 mg/dL (ref 8.4–10.5)
GFR calc non Af Amer: 25 mL/min — ABNORMAL LOW (ref >90)
GFR, EST AFRICAN AMERICAN: 29 mL/min — AB (ref >90)
Glucose, Bld: 122 mg/dL — ABNORMAL HIGH (ref 70–99)
POTASSIUM: 3.9 meq/L (ref 3.7–5.3)
Sodium: 138 mEq/L (ref 137–147)

## 2013-07-20 LAB — HEMOGLOBIN AND HEMATOCRIT, BLOOD
HCT: 22 % — ABNORMAL LOW (ref 39.0–52.0)
Hemoglobin: 7.7 g/dL — ABNORMAL LOW (ref 13.0–17.0)

## 2013-07-20 LAB — CBC
HCT: 19.4 % — ABNORMAL LOW (ref 39.0–52.0)
Hemoglobin: 6.5 g/dL — CL (ref 13.0–17.0)
MCH: 30.5 pg (ref 26.0–34.0)
MCHC: 33.5 g/dL (ref 30.0–36.0)
MCV: 91.1 fL (ref 78.0–100.0)
PLATELETS: 126 10*3/uL — AB (ref 150–400)
RBC: 2.13 MIL/uL — AB (ref 4.22–5.81)
RDW: 13.8 % (ref 11.5–15.5)
WBC: 9.3 10*3/uL (ref 4.0–10.5)

## 2013-07-20 LAB — CLOSTRIDIUM DIFFICILE BY PCR: Toxigenic C. Difficile by PCR: NEGATIVE

## 2013-07-20 LAB — GLUCOSE, CAPILLARY
GLUCOSE-CAPILLARY: 103 mg/dL — AB (ref 70–99)
GLUCOSE-CAPILLARY: 145 mg/dL — AB (ref 70–99)
GLUCOSE-CAPILLARY: 156 mg/dL — AB (ref 70–99)
Glucose-Capillary: 112 mg/dL — ABNORMAL HIGH (ref 70–99)

## 2013-07-20 LAB — OCCULT BLOOD X 1 CARD TO LAB, STOOL: FECAL OCCULT BLD: NEGATIVE

## 2013-07-20 MED ORDER — HYDROCODONE-ACETAMINOPHEN 5-325 MG PO TABS: 1.0000 | ORAL_TABLET | Freq: Four times a day (QID) | ORAL | Status: DC | PRN

## 2013-07-20 MED ORDER — ASPIRIN 325 MG PO TBEC: 325.0000 mg | DELAYED_RELEASE_TABLET | Freq: Two times a day (BID) | ORAL | Status: DC

## 2013-07-20 NOTE — Progress Notes (Signed)
Subjective: 3 Days Post-Op Procedure(s) (LRB): ARTHROPLASTY BIPOLAR HIP (Left) Patient reports pain as 1 on 0-10 scale.  Now receiving 2-units of packed cells.He looks so much better today. Plan on DC to SNF tomorrow.BUN and Creatinine are decreasing.  Objective: Vital signs in last 24 hours: Temp:  [97.3 F (36.3 C)-98.9 F (37.2 C)] 98.8 F (37.1 C) (02/11 0850) Pulse Rate:  [55-69] 66 (02/11 0850) Resp:  [16-18] 16 (02/11 0850) BP: (128-163)/(64-80) 140/72 mmHg (02/11 0850) SpO2:  [96 %-99 %] 99 % (02/11 0645)  Intake/Output from previous day: 02/10 0701 - 02/11 0700 In: 600 [P.O.:600] Out: 1450 [Urine:1450] Intake/Output this shift: Total I/O In: 360 [P.O.:360] Out: -    Recent Labs  07/18/13 0400 07/18/13 1550 07/19/13 0419 07/20/13 0410  HGB 8.3* 7.3* 7.2* 6.5*    Recent Labs  07/19/13 0419 07/20/13 0410  WBC 9.6 9.3  RBC 2.29* 2.13*  HCT 20.9* 19.4*  PLT 124* 126*    Recent Labs  07/19/13 0419 07/20/13 0410  NA 136* 138  K 4.4 3.9  CL 102 105  CO2 22 22  BUN 58* 53*  CREATININE 3.18* 2.31*  GLUCOSE 131* 122*  CALCIUM 8.8 8.8    Recent Labs  07/18/13 0400  INR 1.29    Dorsiflexion/Plantar flexion intact No cellulitis present  Assessment/Plan: 3 Days Post-Op Procedure(s) (LRB): ARTHROPLASTY BIPOLAR HIP (Left) Plan for discharge tomorrow to SNF  Alaze Garverick A 07/20/2013, 10:10 AM

## 2013-07-20 NOTE — Progress Notes (Signed)
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 difficile negative -  Patient declined Imodium -  Continue IV fluids and monitor to see if diarrhea will resolve spontaneously  Hypertension, blood pressure stable, continue amlodipine  Seizure disorder, stable, continue Keppra and Depakote  -  We will avoid medications which can lower seizure threshold  Type 2 diabetes mellitus, hemoglobin A1c of 6.1 -  Continue low-dose sliding scale insulin  Dementia, stable, continue Namenda  Leukocytosis, urinalysis and chest x-ray negative, likely reactive from recent surgery  Diet:  Diabetic Access:  PIV IVF:  Yes Proph:  Aspirin and SCDs  Code Status: DO NOT RESUSCITATE Family Communication: Patient alone Disposition Plan: Pending improvement in creatinine and blood counts, to skilled nursing facility tomorrow   Consultants:  Orthopedics, [name]  Procedures:  Left ORIF on 2/9  Renal ultrasound  Antibiotics:  Cefazolin on 2/8   HPI/Subjective:  Patient states that he feels better, he is eating better.    Objective: Filed Vitals:   07/20/13 0850 07/20/13 0950 07/20/13 1050 07/20/13 1148  BP: 140/72 169/75 160/75 140/73  Pulse: 66 75 71 69  Temp: 98.8 F (37.1 C)  98.6 F (37 C) 98.3 F (36.8 C)  TempSrc: Oral      Resp: 16 16 18 18   Height:      Weight:      SpO2:  98%      Intake/Output Summary (Last 24 hours) at 07/20/13 1438 Last data filed at 07/20/13 1200  Gross per 24 hour  Intake    911 ml  Output    650 ml  Net    261 ml   Filed Weights   07/16/13 2054  Weight: 89.812 kg (198 lb)    Exam:   General:  Adult male, No acute distress, mildly confused during history  HEENT:  NCAT, MMM  Cardiovascular:  RRR, nl S1, S2 no mrg, 2+ pulses, warm extremities  Respiratory:  CTAB, no increased WOB  Abdomen:   Hyperactive bowel sounds, soft, NT/ND  MSK:   Normal tone and bulk, no LEE, left dressing with some bloodstains which have dried and some mild swelling and bruising, no obvious hematoma  Neuro:  Grossly intact  Data Reviewed: Basic Metabolic Panel:  Recent Labs Lab 07/16/13 2150 07/17/13 0452 07/18/13 0400 07/19/13 0419 07/20/13 0410  NA 140 137 138 136* 138  K 4.5 4.7 5.4* 4.4 3.9  CL 102 102 101 102 105  CO2 28 26 23 22 22   GLUCOSE 105* 122* 149* 131* 122*  BUN 36* 34* 44* 58* 53*  CREATININE 2.09* 1.88* 2.85* 3.18* 2.31*  CALCIUM 10.0 9.4 8.8 8.8 8.8   Liver Function Tests:  Recent Labs Lab 07/17/13 0452  AST 13  ALT 15  ALKPHOS 62  BILITOT 0.3  PROT  6.4  ALBUMIN 3.0*   No results found for this basename: LIPASE, AMYLASE,  in the last 168 hours No results found for this basename: AMMONIA,  in the last 168 hours CBC:  Recent Labs Lab 07/16/13 2150 07/17/13 0452 07/18/13 0400 07/18/13 1550 07/19/13 0419 07/20/13 0410  WBC 5.4 9.4 15.1* 11.9* 9.6 9.3  NEUTROABS 3.2  --   --  7.5  --   --   HGB 11.6* 10.7* 8.3* 7.3* 7.2* 6.5*  HCT 34.0* 31.8* 25.0* 21.5* 20.9* 19.4*  MCV 92.6 91.4 92.6 92.3 91.3 91.1  PLT 138* 138* 147* 120* 124* 126*   Cardiac Enzymes: No results found for this basename: CKTOTAL, CKMB, CKMBINDEX, TROPONINI,  in the last 168 hours BNP (last 3 results) No results found for this basename: PROBNP,  in the last 8760  hours CBG:  Recent Labs Lab 07/19/13 1148 07/19/13 1728 07/19/13 2109 07/20/13 0719 07/20/13 1105  GLUCAP 118* 123* 115* 103* 112*    Recent Results (from the past 240 hour(s))  SURGICAL PCR SCREEN     Status: None   Collection Time    07/17/13  1:13 AM      Result Value Ref Range Status   MRSA, PCR NEGATIVE  NEGATIVE Final   Staphylococcus aureus NEGATIVE  NEGATIVE Final   Comment:            The Xpert SA Assay (FDA     approved for NASAL specimens     in patients over 43 years of age),     is one component of     a comprehensive surveillance     program.  Test performance has     been validated by [employer] for patients greater     than or equal to 52 year old.     It is not intended     to diagnose infection nor to     guide or monitor treatment.  URINE CULTURE     Status: None   Collection Time    07/18/13  3:52 PM      Result Value Ref Range Status   Specimen Description URINE, RANDOM   Final   Special Requests NONE   Final   Culture  Setup Time     Final   Value: 07/18/2013 21:01     Performed at [facility]     Final   Value: NO GROWTH     Performed at [facility]   Culture     Final   Value: NO GROWTH     Performed at [facility]   Report Status 07/19/2013 FINAL   Final  CLOSTRIDIUM DIFFICILE BY PCR     Status: None   Collection Time    07/19/13 10:51 PM      Result Value Ref Range Status   C difficile by pcr NEGATIVE  NEGATIVE Final   Comment: Performed at [facility]     Studies: Dg Chest 1 View  07/18/2013   CLINICAL DATA:  Status post left hip arthroplasty for fracture.  EXAM: CHEST - 1 VIEW  COMPARISON:  DG RIBS UNILATERAL W/CHEST*R* dated 05/02/2013; DG CHEST 1V PORT dated 02/22/2013; DG KNEE1- 2 VIEWS*L* dated 07/16/2013; DG HIP COMPLETE*L* dated 07/16/2013  FINDINGS: The heart size is normal. The aorta is ectatic. There is no evidence of pulmonary edema, consolidation, pneumothorax, nodule or  pleural fluid.  IMPRESSION: No active disease.   Electronically  Signed   By: Aletta Edouard M.D.   On: 07/18/2013 16:08   US Renal  07/19/2013   CLINICAL DATA:  Worsening creatinine.  EXAM: RENAL/URINARY TRACT ULTRASOUND COMPLETE  COMPARISON:  09/27/2012  FINDINGS: Right Kidney:  Length: 13.5 cm. There multiple cysts. Normal parenchymal echogenicity. No solid renal masses. Largest cyst arises from the lower pole measuring 9.2 cm in greatest dimension. 4 cm cyst arises from the upper pole. No hydronephrosis.  Left Kidney:  Length: 11.9 cm. Normal echogenicity. Multiple cysts. There are 2 upper pole cysts, the larger measuring 5 cm with the smaller cyst just below this measuring 3.9 cm no solid renal masses, no stones and no hydronephrosis.  Bladder:  Decompressed by a Foley catheter  Incidental note is made of gallstones.  IMPRESSION: 1. Normal renal sizes and overall parenchymal echogenicity. 2. No hydronephrosis. 3. Bilateral renal cysts, essentially stable.   Electronically Signed   By: Lajean Manes M.D.   On: 07/19/2013 15:25    Scheduled Meds: . amLODipine  2.5 mg Oral Daily  . antiseptic oral rinse  15 mL Mouth Rinse q12n4p  . aspirin EC  325 mg Oral Daily  . atenolol  50 mg Oral Daily  . cholecalciferol  400 Units Oral Daily  . divalproex  125 mg Oral Q24H  . divalproex  250 mg Oral 3 times per day  . ferrous sulfate  325 mg Oral TID PC  . finasteride  5 mg Oral Daily  . insulin aspart  0-15 Units Subcutaneous TID WC  . levETIRAcetam  500 mg Oral Q12H  . Memantine HCl ER  28 mg Oral Daily  . multivitamin with minerals  1 tablet Oral Daily  . pantoprazole  40 mg Oral Daily  . simvastatin  20 mg Oral QPM   Continuous Infusions: . sodium chloride 100 mL/hr at 07/19/13 2012  . lactated ringers 75 mL/hr at 07/17/13 1930    Active Problems:   Hip fracture   Femoral fracture   Fracture of femoral neck, left   Acute blood loss anemia    Time spent: 30 min    Emmabelle Fear,  [facility]  [facility] Pager [phone]. If 7PM-7AM, please contact night-coverage at [email or web address], password [facility] 07/20/2013, 2:38 PM  LOS: 4 days

## 2013-07-20 NOTE — Care Management Note (Signed)
    Page 1 of 1   07/20/2013     5:49:02 PM   CARE MANAGEMENT NOTE 07/20/2013  Patient:  Kurt [name], Kurt [name]   Account Number:  [id number]  Date Initiated:  07/20/2013  Documentation initiated by:  Sherrin Daisy  Subjective/Objective Assessment:   dx hip fracture; arthroplasty bipolar hip     Action/Plan:   SNF rehab   Anticipated DC Date:  07/21/2013   Anticipated DC Plan:  SKILLED NURSING FACILITY  In-house referral  Clinical Social Worker      DC Planning Services  CM consult      Choice offered to / List presented to:             Status of service:  Completed, signed off Medicare Important Message given?   (If response is "NO", the following Medicare IM given date fields will be blank) Date Medicare IM given:   Date Additional Medicare IM given:    Discharge Disposition:    Per UR Regulation:  Reviewed for med. necessity/level of care/duration of stay  If discussed at [place] of Stay Meetings, dates discussed:    Comments:

## 2013-07-20 NOTE — Discharge Instructions (Addendum)
Orthopedic Discharge Instructions DVT Prophylaxis: Take Aspirin 325mg  twice daily Weightbearing status: WBAT but due to patient's instability, he should only be bed to chair at this time. Once patient demonstrates stability in transfers, progress to walking with the walker WBAT with assistance.  ABLA management: iron supplementation with ferrous sulfate Follow up in Dr. Charlestine Night office in 2 weeks The dressing does not need to be changed unless there is excess drainage. It will be changed at his first post-op visit. Call if any temperatures greater than 101 or any wound complications: [phone] during the day and ask for Dr. Charlestine Night nurse, Brunilda Payor.

## 2013-07-20 NOTE — Progress Notes (Signed)
CRITICAL VALUE ALERT  Critical value received:  hgb 6.5  Date of notification:  07/20/13  Time of notification:  0530  Critical value read back:yes  Nurse who received alert:  Grover Canavan RN  MD notified (1st page):  Schorr NP  Time of first page:  0600  MD notified (2nd page): Schorr NP  Time of second page: 0620  Responding MD:  Schorr NP  Time MD responded:  [phone]

## 2013-07-20 NOTE — Progress Notes (Signed)
PT Cancellation Note  _X_Treatment cancelled today due to medical issues with patient which prohibited therapy.........  HgB 6.5  ___ Treatment cancelled today due to patient receiving procedure or test   ___ Treatment cancelled today due to patient's refusal to participate   ___ Treatment cancelled today due to  Rica Koyanagi  PTA [facility]  Acute  Rehab Pager      [phone]

## 2013-07-21 DIAGNOSIS — G9340 Encephalopathy, unspecified: Secondary | ICD-10-CM

## 2013-07-21 LAB — BASIC METABOLIC PANEL
BUN: 49 mg/dL — ABNORMAL HIGH (ref 6–23)
CALCIUM: 9 mg/dL (ref 8.4–10.5)
CO2: 21 meq/L (ref 19–32)
CREATININE: 1.98 mg/dL — AB (ref 0.50–1.35)
Chloride: 109 mEq/L (ref 96–112)
GFR calc Af Amer: 34 mL/min — ABNORMAL LOW (ref >90)
GFR, EST NON AFRICAN AMERICAN: 30 mL/min — AB (ref >90)
Glucose, Bld: 157 mg/dL — ABNORMAL HIGH (ref 70–99)
Potassium: 3.2 mEq/L — ABNORMAL LOW (ref 3.7–5.3)
Sodium: 142 mEq/L (ref 137–147)

## 2013-07-21 LAB — CBC
HCT: 22.4 % — ABNORMAL LOW (ref 39.0–52.0)
Hemoglobin: 7.6 g/dL — ABNORMAL LOW (ref 13.0–17.0)
MCH: 30.9 pg (ref 26.0–34.0)
MCHC: 33.9 g/dL (ref 30.0–36.0)
MCV: 91.1 fL (ref 78.0–100.0)
PLATELETS: 156 10*3/uL (ref 150–400)
RBC: 2.46 MIL/uL — ABNORMAL LOW (ref 4.22–5.81)
RDW: 14.1 % (ref 11.5–15.5)
WBC: 9.3 10*3/uL (ref 4.0–10.5)

## 2013-07-21 LAB — TYPE AND SCREEN
ABO/RH(D): A POS
Antibody Screen: NEGATIVE
Unit division: 0

## 2013-07-21 LAB — GLUCOSE, CAPILLARY: Glucose-Capillary: 130 mg/dL — ABNORMAL HIGH (ref 70–99)

## 2013-07-21 MED ORDER — POTASSIUM CHLORIDE CRYS ER 20 MEQ PO TBCR
40.0000 meq | EXTENDED_RELEASE_TABLET | Freq: Once | ORAL | Status: AC
  Administered 2013-07-21: 40 meq via ORAL
  Filled 2013-07-21: qty 2

## 2013-07-21 MED ORDER — LORAZEPAM 1 MG PO TABS: 1.0000 mg | ORAL_TABLET | Freq: Four times a day (QID) | ORAL | Status: DC | PRN

## 2013-07-21 MED ORDER — FERROUS SULFATE 325 (65 FE) MG PO TABS: 325.0000 mg | ORAL_TABLET | Freq: Three times a day (TID) | ORAL | Status: DC

## 2013-07-21 MED ORDER — POLYETHYLENE GLYCOL 3350 17 G PO PACK: 17.0000 g | PACK | Freq: Every day | ORAL | Status: DC | PRN

## 2013-07-21 MED ORDER — BISACODYL 10 MG RE SUPP: 10.0000 mg | Freq: Every day | RECTAL | Status: DC | PRN

## 2013-07-21 NOTE — Progress Notes (Signed)
Physical Therapy Treatment Patient Details Name: Kurt [name] MRN: [id number] DOB: 08/08/1928 Today's Date: 07/21/2013 Time: 1035-1100 PT Time Calculation (min): 25 min  PT Assessment / Plan / Recommendation  History of Present Illness Pt fell while washing dishes at his ALF falling on his L side.     PT Comments   POD #3 assisted pt OOB to attempt amb however cut short due to uncontrolled loose stools. Assisted pt to [facility] then back to bed.    Follow Up Recommendations  SNF     Does the patient have the potential to tolerate intense rehabilitation     Barriers to Discharge        Equipment Recommendations       Recommendations for Other Services    Frequency Min 3X/week   Progress towards PT Goals Progress towards PT goals: Progressing toward goals  Plan      Precautions / Restrictions Precautions Precautions: Posterior Hip;Fall Precaution Comments: Instructed pt in posterior hip precautions using Teach back Method as well as WB status.  5 min later pt could not recall any.  Restrictions Weight Bearing Restrictions: Yes LLE Weight Bearing: Partial weight bearing   Pertinent Vitals/Pain No c/o pain    Mobility  Bed Mobility Overal bed mobility: +2 for physical assistance;Needs Assistance Bed Mobility: Supine to Sit;Sit to Supine Supine to sit: +2 for physical assistance;HOB elevated Sit to supine: +2 for physical assistance General bed mobility comments: pt 10%, assist to raise trunk and advance BLEs.  Statis sitting EOB required Min assist due to listing posterior lean.   Transfers Overall transfer level: Needs assistance Equipment used:  (EVA walker) Transfers: Sit to/from Stand Sit to Stand: From elevated surface;+2 physical assistance General transfer comment: used EVA walker for increased support + 2 total assist pt 15% with MAX VC's to increase self support.  Poor flex forward standing posture.  Assisted off bed and on/off BSC.  Ambulation/Gait Gait  velocity: did not amb this session due to fatigue and loose stools.  Static standing twice to use BSC.     PT Goals (current goals can now be found in the care plan section)    Visit Information  Last PT Received On: 07/21/13 Assistance Needed: +2 History of Present Illness: Pt fell while washing dishes at his ALF falling on his L side.      Subjective Data      Cognition       Balance     End of Session PT - End of Session Equipment Utilized During Treatment: Gait belt Activity Tolerance: Patient limited by fatigue (loose stools) Patient left: in bed;with call bell/phone within reach Nurse Communication:  (loose stools)   Rica Koyanagi  PTA [facility]  Acute  Rehab Pager      [phone]

## 2013-07-21 NOTE — Discharge Summary (Signed)
Physician Discharge Summary  Kurt [name] RJJ:[id number] DOB: [birth year] DOA: 07/16/2013  PCP: Kurt Poll, MD  Admit date: 07/16/2013 Discharge date: 07/21/2013  Recommendations for Outpatient Follow-up:  1. SNF for ongoing care, including PT/OT 2. Repeat CBC and BMP in 2 weeks for follow up of anemia and kidney function.   3. Orthopedics follow up with [name] in 2 weeks.    Discharge Diagnoses:  Principal Problem:   Fracture of femoral neck, left Active Problems:   Seizure disorder, with left leg weakness (Todds phenomenon)   Hypertension   Diabetes mellitus without complication   CKD (chronic kidney disease), stage III   Hip fracture   Femoral fracture   Acute blood loss anemia   Acute kidney injury   Discharge Condition: Stable, improved  Diet recommendation: Diabetic  Wt Readings from Last 3 Encounters:  07/16/13 89.812 kg (198 lb)  07/16/13 89.812 kg (198 lb)  02/22/13 90.1 kg (198 lb 10.2 oz)    History of present illness:  This is a 80 y.o. year old male with significant past medical history of dementia, seizure disorder with left leg weakness, hypertension, diabetes, hyperlipidemia presenting with mechanical fall and left femoral fracture. Patient is a resident of [facility]. Patient states that he was trying to wash dishes and accidentally fell landing on his left side. Patient has severe left-sided pain from this point. Patient subsequently presented to the ER with worsening pain. Left hip x-ray obtained showing left subcapital femoral fracture. Orthopedics consult it. They will operate in the morning. Recommending medical admission with orthopedic consultation. Patient denies any chest pain, shortness of breath, weakness, dizziness prior to onset. No direct head trauma. Denies any recent seizure activity. Patient states blood sugars have been well controlled.   Hospital Course:   Left femur fracture, s/p ORIF on 2/9 without complication.   He was seen by PT/OT who recommended SNF for ongoing care and rehabilitation.  He should be weight bearing as tolerated left lower extremity.  Continue Aspirin 325 mg twice daily for the next 4 weeks for post-operative DVT prophylaxis. Follow up with orthopedics in 2 weeks.    Acute blood loss anemia superimposed on anemia of renal parenchymal disease, no obvious hematoma of the left leg, however, he does have some soft tissue swelling and bruising in that area.  He was transfused 1 unit PRBC and his hemoglobin is now stable near 7.6mg /dl.  He was started on three times daily iron and should have a repeat CBC done in 2 weeks.    AKI , CKD stage 3/4, likely due to dehydration and improving with IV fluids, creatinine back to baseline on the day of discharge.  He should have repeat BMP in 2 weeks to make sure creatinine is stable.  Renal ultrasound was normal.  Diarrhea, several watery bowel movements overnight.  C. difficile was negative.  Patient declined Imodium.  Diarrhea slowed spontaneously.   Hypertension, blood pressure stable, continued amlodipine.  Seizure disorder, stable, continued Keppra and Depakote.  Diet-controlled type 2 diabetes mellitus, hemoglobin A1c of 6.1.  Given SSI perioperatively, but does not need to continue any diabetes medications at this time.    Dementia, stable, continue Namenda   Leukocytosis, urinalysis and chest x-ray negative, likely reactive from recent surgery and resolved.    Thrombocytopenia, mild and likely due to intraoperative and perioperative blood loss and resolved.    Hypokalemia, likely due to poor oral intake and IVF without potassium.  Given oral potassium repletion.  Consultants:  Orthopedics, Doctor Kurt [name] Procedures:  Left ORIF on 2/9  Renal ultrasound Antibiotics:  Cefazolin on 2/8    Discharge Exam: Filed Vitals:   07/21/13 0545  BP: 154/77  Pulse: 64  Temp: 98 F (36.7 C)  Resp: 16   Filed Vitals:   07/20/13 1614  07/20/13 2025 07/20/13 2155 07/21/13 0545  BP: 160/79  187/84 154/77  Pulse: 66  71 64  Temp: 98.3 F (36.8 C)  98.4 F (36.9 C) 98 F (36.7 C)  TempSrc:   Oral Oral  Resp: 16 16 18 16   Height:      Weight:      SpO2: 98%  97% 97%    General: Adult male, No acute distress, mildly confused during history  HEENT: NCAT, MMM  Cardiovascular: RRR, nl S1, S2 no mrg, 2+ pulses, warm extremities  Respiratory: CTAB, no increased WOB  Abdomen:  NABS, soft, NT/ND  MSK: Normal tone and bulk, no LEE, left dressing with some bloodstains which have dried and some swelling and bruising which are stable from yesterday, no obvious hematoma  Neuro: Grossly intact   Discharge Instructions      Discharge Orders   Future Orders Complete By Expires   Call MD for:  difficulty breathing, headache or visual disturbances  As directed    Call MD for:  extreme fatigue  As directed    Call MD for:  hives  As directed    Call MD for:  persistant dizziness or light-headedness  As directed    Call MD for:  persistant nausea and vomiting  As directed    Call MD for:  redness, tenderness, or signs of infection (pain, swelling, redness, odor or green/yellow discharge around incision site)  As directed    Call MD for:  severe uncontrolled pain  As directed    Call MD for:  temperature >100.4  As directed    Diet Carb Modified  As directed    Discharge instructions  As directed    Comments:     Mr. Kurt [name] was hospitalized with a left hip fracture.  He underwent surgical fixation of his fracture on 2/9.  He developed diarrhea and anemia post-operatively and required a 1 unit blood transfusion.  He also developed some mild dehydration and acute kidney injury which is now resolved.  He will need repeat bloodwork in 1-2 weeks and will follow up with orthopedics in 2 weeks.   Weight bearing as tolerated  As directed    Comments:     WBAT but due to patient's instability, he is bed to chair only at this point    Questions:     Laterality:  left   Extremity:  Lower       Medication List         amLODipine 2.5 MG tablet  Commonly known as:  NORVASC  Take 2.5 mg by mouth daily.     aspirin 325 MG EC tablet  Take 1 tablet (325 mg total) by mouth 2 (two) times daily.     atenolol 50 MG tablet  Commonly known as:  TENORMIN  Take 50 mg by mouth daily.     bisacodyl 10 MG suppository  Commonly known as:  DULCOLAX  Place 1 suppository (10 mg total) rectally daily as needed for moderate constipation.     cholecalciferol 400 UNITS Tabs tablet  Commonly known as:  VITAMIN D  Take 400 Units by mouth daily.     divalproex 125 MG capsule  Commonly known as:  DEPAKOTE SPRINKLE  Take 125-250 mg by mouth 4 (four) times daily. Take 2 capsules by mouth at 8AM, then Take 1 capsule by mouth at noon, 4pm, and 8pm     ferrous sulfate 325 (65 FE) MG tablet  Take 1 tablet (325 mg total) by mouth 3 (three) times daily with meals.     finasteride 5 MG tablet  Commonly known as:  PROSCAR  Take 5 mg by mouth daily.     HYDROcodone-acetaminophen 5-325 MG per tablet  Commonly known as:  NORCO/VICODIN  Take 1-2 tablets by mouth every 6 (six) hours as needed for moderate pain.     levETIRAcetam 500 MG tablet  Commonly known as:  KEPPRA  Take 500 mg by mouth every 12 (twelve) hours.     LORazepam 1 MG tablet  Commonly known as:  ATIVAN  Take 1 tablet (1 mg total) by mouth every 6 (six) hours as needed for anxiety.     multivitamin with minerals Tabs tablet  Take 1 tablet by mouth daily.     NAMENDA XR 14 MG Cp24  Generic drug:  Memantine HCl ER  Take 1 capsule by mouth daily. For 7 days     NAMENDA XR 28 MG Cp24  Generic drug:  Memantine HCl ER  Take 1 capsule by mouth daily.     pantoprazole 40 MG tablet  Commonly known as:  PROTONIX  Take 40 mg by mouth daily.     polyethylene glycol packet  Commonly known as:  MIRALAX / GLYCOLAX  Take 17 g by mouth daily as needed for mild constipation.      simvastatin 20 MG tablet  Commonly known as:  ZOCOR  Take 20 mg by mouth every evening.       Follow-up Information   Follow up with Kurt [name],RONALD A, MD. Schedule an appointment as soon as possible for a visit in 2 weeks.   Specialty:  Orthopedic Surgery   Contact information:   [address] [place] [address] [phone]       Follow up with Kurt Poll, MD. Schedule an appointment as soon as possible for a visit in 2 weeks. (or supervising physician to follow up blood work.  )    Specialty:  Family Medicine   Contact information:   [address] [place] [address] [phone]       The results of significant diagnostics from this hospitalization (including imaging, microbiology, ancillary and laboratory) are listed below for reference.    Significant Diagnostic Studies: Dg Chest 1 View  07/18/2013   CLINICAL DATA:  Status post left hip arthroplasty for fracture.  EXAM: CHEST - 1 VIEW  COMPARISON:  DG RIBS UNILATERAL W/CHEST*R* dated 05/02/2013; DG CHEST 1V PORT dated 02/22/2013; DG KNEE1- 2 VIEWS*L* dated 07/16/2013; DG HIP COMPLETE*L* dated 07/16/2013  FINDINGS: The heart size is normal. The aorta is ectatic. There is no evidence of pulmonary edema, consolidation, pneumothorax, nodule or pleural fluid.  IMPRESSION: No active disease.   Electronically Signed   By: Aletta Edouard M.D.   On: 07/18/2013 16:08   Dg Hip Complete Left  07/16/2013   CLINICAL DATA:  Left hip pain after fall.  EXAM: LEFT HIP - COMPLETE 2+ VIEW  COMPARISON:  None available for comparison at time of study interpretation.  FINDINGS: Left subcapital femur fracture with varus angulation distal bony fragments, impaction. Femoral head is well formed and located. No dislocation.  Bone mineral density  is decreased without destructive bony lesions. Periarticular soft tissue planes are nonsuspicious.  IMPRESSION: Angulated impacted left subcapital femur fracture without  dislocation.   Electronically Signed   By: Elon Alas   On: 07/16/2013 22:36   Dg Knee 1-2 Views Left  07/16/2013   CLINICAL DATA:  Fall with left knee pain.  EXAM: LEFT KNEE - 1-2 VIEW  COMPARISON:  None.  FINDINGS: Oblique images were not obtained due to limited patient mobility because of a hip fracture.  Bones are osteopenic. There is no evidence of acute fracture or dislocation. No joint effusion is seen. Patellar enthesophytes are present. Medial and lateral compartment joint spaces are maintained. Vascular calcifications are present.  IMPRESSION: No evidence of acute osseous abnormality involving the left knee.   Electronically Signed   By: Logan Bores   On: 07/16/2013 22:38   US Renal  07/19/2013   CLINICAL DATA:  Worsening creatinine.  EXAM: RENAL/URINARY TRACT ULTRASOUND COMPLETE  COMPARISON:  09/27/2012  FINDINGS: Right Kidney:  Length: 13.5 cm. There multiple cysts. Normal parenchymal echogenicity. No solid renal masses. Largest cyst arises from the lower pole measuring 9.2 cm in greatest dimension. 4 cm cyst arises from the upper pole. No hydronephrosis.  Left Kidney:  Length: 11.9 cm. Normal echogenicity. Multiple cysts. There are 2 upper pole cysts, the larger measuring 5 cm with the smaller cyst just below this measuring 3.9 cm no solid renal masses, no stones and no hydronephrosis.  Bladder:  Decompressed by a Foley catheter  Incidental note is made of gallstones.  IMPRESSION: 1. Normal renal sizes and overall parenchymal echogenicity. 2. No hydronephrosis. 3. Bilateral renal cysts, essentially stable.   Electronically Signed   By: Lajean Manes M.D.   On: 07/19/2013 15:25   Dg Pelvis Portable  07/17/2013   CLINICAL DATA:  Surgery for a left femoral neck fracture.  EXAM: PORTABLE PELVIS 1-2 VIEWS  COMPARISON:  None.  FINDINGS: Left hip prosthesis in satisfactory position and alignment. No fracture or dislocation seen on a single frontal view. Diffuse osteopenia.  IMPRESSION:  Satisfactory postoperative appearance of a left hip prosthesis.   Electronically Signed   By: Enrique Sack M.D.   On: 07/17/2013 13:30    Microbiology: Recent Results (from the past 240 hour(s))  SURGICAL PCR SCREEN     Status: None   Collection Time    07/17/13  1:13 AM      Result Value Ref Range Status   MRSA, PCR NEGATIVE  NEGATIVE Final   Staphylococcus aureus NEGATIVE  NEGATIVE Final   Comment:            The Xpert SA Assay (FDA     approved for NASAL specimens     in patients over 58 years of age),     is one component of     a comprehensive surveillance     program.  Test performance has     been validated by [employer] for patients greater     than or equal to 51 year old.     It is not intended     to diagnose infection nor to     guide or monitor treatment.  URINE CULTURE     Status: None   Collection Time    07/18/13  3:52 PM      Result Value Ref Range Status   Specimen Description URINE, RANDOM   Final   Special Requests NONE   Final  Culture  Setup Time     Final   Value: 07/18/2013 21:01     Performed at [facility]     Final   Value: NO GROWTH     Performed at [facility]   Culture     Final   Value: NO GROWTH     Performed at [facility]   Report Status 07/19/2013 FINAL   Final  CLOSTRIDIUM DIFFICILE BY PCR     Status: None   Collection Time    07/19/13 10:51 PM      Result Value Ref Range Status   C difficile by pcr NEGATIVE  NEGATIVE Final   Comment: Performed at [facility]: Basic Metabolic Panel:  Recent Labs Lab 07/17/13 0452 07/18/13 0400 07/19/13 0419 07/20/13 0410 07/21/13 0447  NA 137 138 136* 138 142  K 4.7 5.4* 4.4 3.9 3.2*  CL 102 101 102 105 109  CO2 26 23 22 22 21   GLUCOSE 122* 149* 131* 122* 157*  BUN 34* 44* 58* 53* 49*  CREATININE 1.88* 2.85* 3.18* 2.31* 1.98*  CALCIUM 9.4 8.8 8.8 8.8 9.0   Liver Function Tests:  Recent Labs Lab 07/17/13 0452  AST  13  ALT 15  ALKPHOS 62  BILITOT 0.3  PROT 6.4  ALBUMIN 3.0*   No results found for this basename: LIPASE, AMYLASE,  in the last 168 hours No results found for this basename: AMMONIA,  in the last 168 hours CBC:  Recent Labs Lab 07/16/13 2150  07/18/13 0400 07/18/13 1550 07/19/13 0419 07/20/13 0410 07/20/13 1415 07/21/13 0447  WBC 5.4  < > 15.1* 11.9* 9.6 9.3  --  9.3  NEUTROABS 3.2  --   --  7.5  --   --   --   --   HGB 11.6*  < > 8.3* 7.3* 7.2* 6.5* 7.7* 7.6*  HCT 34.0*  < > 25.0* 21.5* 20.9* 19.4* 22.0* 22.4*  MCV 92.6  < > 92.6 92.3 91.3 91.1  --  91.1  PLT 138*  < > 147* 120* 124* 126*  --  156  < > = values in this interval not displayed. Cardiac Enzymes: No results found for this basename: CKTOTAL, CKMB, CKMBINDEX, TROPONINI,  in the last 168 hours BNP: BNP (last 3 results) No results found for this basename: PROBNP,  in the last 8760 hours CBG:  Recent Labs Lab 07/20/13 0719 07/20/13 1105 07/20/13 1703 07/20/13 2136 07/21/13 0719  GLUCAP 103* 112* 145* 156* 130*    Time coordinating discharge: 45 minutes  Signed:  Murvin Gift  [facility] 07/21/2013, 12:45 PM

## 2013-07-21 NOTE — Progress Notes (Signed)
Clinical Social Work Department CLINICAL SOCIAL WORK PLACEMENT NOTE 07/21/2013  Patient:  Kurt [name], Kurt [name]  Account Number:  [id number] Admit date:  07/16/2013  Clinical Social Worker:  Werner Lean, LCSW  Date/time:  07/18/2013 03:23 PM  Clinical Social Work is seeking post-discharge placement for this patient at the following level of care:   SKILLED NURSING   (*CSW will update this form in Epic as items are completed)   07/18/2013  Patient/family provided with [place] Department of Clinical Social Work's list of facilities offering this level of care within the geographic area requested by the patient (or if unable, by the patient's family).  07/18/2013  Patient/family informed of their freedom to choose among providers that offer the needed level of care, that participate in Medicare, Medicaid or managed care program needed by the patient, have an available bed and are willing to accept the patient.    Patient/family informed of [facility]' ownership interest in [facility], as well as of the fact that they are under no obligation to receive care at this facility.  PASARR submitted to EDS on 07/18/2013 PASARR number received from EDS on 07/18/2013  FL2 transmitted to all facilities in geographic area requested by pt/family on  07/18/2013 FL2 transmitted to all facilities within larger geographic area on   Patient informed that his/her managed care company has contracts with or will negotiate with  certain facilities, including the following:     Patient/family informed of bed offers received:  07/18/2013 Patient chooses bed at [facility] Physician recommends and patient chooses bed at    Patient to be transferred to [facility] on  07/21/2013 Patient to be transferred to facility by [facility]  The following physician request were entered in Epic:   Additional Comments: [facility] Medicare authorized SNF placement.  Werner Lean  LCSW [phone]

## 2013-07-26 ENCOUNTER — Encounter (HOSPITAL_COMMUNITY): Payer: Self-pay | Admitting: Emergency Medicine

## 2013-07-26 ENCOUNTER — Inpatient Hospital Stay (HOSPITAL_COMMUNITY)
Admission: EM | Admit: 2013-07-26 | Discharge: 2013-07-28 | DRG: 871 | Disposition: A | Discharge status: Hospice | Payer: Medicare Other | Attending: Internal Medicine | Admitting: Internal Medicine

## 2013-07-26 ENCOUNTER — Emergency Department (HOSPITAL_COMMUNITY): Payer: Medicare Other

## 2013-07-26 DIAGNOSIS — F0390 Unspecified dementia without behavioral disturbance: Secondary | ICD-10-CM | POA: Diagnosis present

## 2013-07-26 DIAGNOSIS — R4182 Altered mental status, unspecified: Secondary | ICD-10-CM

## 2013-07-26 DIAGNOSIS — G9340 Encephalopathy, unspecified: Secondary | ICD-10-CM

## 2013-07-26 DIAGNOSIS — K631 Perforation of intestine (nontraumatic): Secondary | ICD-10-CM | POA: Diagnosis present

## 2013-07-26 DIAGNOSIS — K668 Other specified disorders of peritoneum: Secondary | ICD-10-CM | POA: Diagnosis present

## 2013-07-26 DIAGNOSIS — Z96649 Presence of unspecified artificial hip joint: Secondary | ICD-10-CM

## 2013-07-26 DIAGNOSIS — I10 Essential (primary) hypertension: Secondary | ICD-10-CM | POA: Diagnosis present

## 2013-07-26 DIAGNOSIS — Z66 Do not resuscitate: Secondary | ICD-10-CM | POA: Diagnosis present

## 2013-07-26 DIAGNOSIS — Z515 Encounter for palliative care: Secondary | ICD-10-CM

## 2013-07-26 DIAGNOSIS — Z8249 Family history of ischemic heart disease and other diseases of the circulatory system: Secondary | ICD-10-CM | POA: Diagnosis not present

## 2013-07-26 DIAGNOSIS — Z79899 Other long term (current) drug therapy: Secondary | ICD-10-CM

## 2013-07-26 DIAGNOSIS — Z9181 History of falling: Secondary | ICD-10-CM | POA: Diagnosis not present

## 2013-07-26 DIAGNOSIS — IMO0002 Reserved for concepts with insufficient information to code with codable children: Secondary | ICD-10-CM | POA: Diagnosis present

## 2013-07-26 DIAGNOSIS — G8929 Other chronic pain: Secondary | ICD-10-CM | POA: Diagnosis present

## 2013-07-26 DIAGNOSIS — I129 Hypertensive chronic kidney disease with stage 1 through stage 4 chronic kidney disease, or unspecified chronic kidney disease: Secondary | ICD-10-CM | POA: Diagnosis present

## 2013-07-26 DIAGNOSIS — A419 Sepsis, unspecified organism: Secondary | ICD-10-CM | POA: Diagnosis present

## 2013-07-26 DIAGNOSIS — N179 Acute kidney failure, unspecified: Secondary | ICD-10-CM | POA: Diagnosis present

## 2013-07-26 DIAGNOSIS — R69 Illness, unspecified: Secondary | ICD-10-CM

## 2013-07-26 DIAGNOSIS — N183 Chronic kidney disease, stage 3 unspecified: Secondary | ICD-10-CM | POA: Diagnosis present

## 2013-07-26 DIAGNOSIS — E785 Hyperlipidemia, unspecified: Secondary | ICD-10-CM | POA: Diagnosis present

## 2013-07-26 DIAGNOSIS — D649 Anemia, unspecified: Secondary | ICD-10-CM | POA: Diagnosis present

## 2013-07-26 DIAGNOSIS — S72009A Fracture of unspecified part of neck of unspecified femur, initial encounter for closed fracture: Secondary | ICD-10-CM | POA: Diagnosis present

## 2013-07-26 DIAGNOSIS — E119 Type 2 diabetes mellitus without complications: Secondary | ICD-10-CM | POA: Diagnosis present

## 2013-07-26 DIAGNOSIS — M545 Low back pain, unspecified: Secondary | ICD-10-CM | POA: Diagnosis present

## 2013-07-26 DIAGNOSIS — R6520 Severe sepsis without septic shock: Secondary | ICD-10-CM

## 2013-07-26 DIAGNOSIS — Z8673 Personal history of transient ischemic attack (TIA), and cerebral infarction without residual deficits: Secondary | ICD-10-CM | POA: Diagnosis not present

## 2013-07-26 DIAGNOSIS — E870 Hyperosmolality and hypernatremia: Secondary | ICD-10-CM | POA: Diagnosis present

## 2013-07-26 DIAGNOSIS — E860 Dehydration: Secondary | ICD-10-CM

## 2013-07-26 DIAGNOSIS — J690 Pneumonitis due to inhalation of food and vomit: Secondary | ICD-10-CM | POA: Diagnosis present

## 2013-07-26 DIAGNOSIS — K658 Other peritonitis: Secondary | ICD-10-CM | POA: Diagnosis present

## 2013-07-26 DIAGNOSIS — R198 Other specified symptoms and signs involving the digestive system and abdomen: Secondary | ICD-10-CM

## 2013-07-26 DIAGNOSIS — N19 Unspecified kidney failure: Secondary | ICD-10-CM

## 2013-07-26 DIAGNOSIS — F411 Generalized anxiety disorder: Secondary | ICD-10-CM | POA: Diagnosis present

## 2013-07-26 DIAGNOSIS — E876 Hypokalemia: Secondary | ICD-10-CM | POA: Diagnosis present

## 2013-07-26 DIAGNOSIS — G40909 Epilepsy, unspecified, not intractable, without status epilepticus: Secondary | ICD-10-CM | POA: Diagnosis present

## 2013-07-26 DIAGNOSIS — J9600 Acute respiratory failure, unspecified whether with hypoxia or hypercapnia: Secondary | ICD-10-CM | POA: Diagnosis present

## 2013-07-26 HISTORY — DX: Transient cerebral ischemic attack, unspecified: G45.9

## 2013-07-26 LAB — URINE MICROSCOPIC-ADD ON

## 2013-07-26 LAB — POCT I-STAT TROPONIN I: Troponin i, poc: 0.18 ng/mL (ref 0.00–0.08)

## 2013-07-26 LAB — COMPREHENSIVE METABOLIC PANEL
ALBUMIN: 2.4 g/dL — AB (ref 3.5–5.2)
ALK PHOS: 52 U/L (ref 39–117)
ALT: 66 U/L — AB (ref 0–53)
AST: 44 U/L — ABNORMAL HIGH (ref 0–37)
BILIRUBIN TOTAL: 0.6 mg/dL (ref 0.3–1.2)
BUN: 93 mg/dL — ABNORMAL HIGH (ref 6–23)
CO2: 17 mEq/L — ABNORMAL LOW (ref 19–32)
Calcium: 10.5 mg/dL (ref 8.4–10.5)
Chloride: 117 mEq/L — ABNORMAL HIGH (ref 96–112)
Creatinine, Ser: 4.79 mg/dL — ABNORMAL HIGH (ref 0.50–1.35)
GFR calc Af Amer: 12 mL/min — ABNORMAL LOW (ref >90)
GFR calc non Af Amer: 10 mL/min — ABNORMAL LOW (ref >90)
Glucose, Bld: 179 mg/dL — ABNORMAL HIGH (ref 70–99)
POTASSIUM: 2.7 meq/L — AB (ref 3.7–5.3)
SODIUM: 156 meq/L — AB (ref 137–147)
Total Protein: 6.9 g/dL (ref 6.0–8.3)

## 2013-07-26 LAB — GLUCOSE, CAPILLARY: GLUCOSE-CAPILLARY: 178 mg/dL — AB (ref 70–99)

## 2013-07-26 LAB — CBC WITH DIFFERENTIAL/PLATELET
Basophils Absolute: 0 10*3/uL (ref 0.0–0.1)
Basophils Relative: 0 % (ref 0–1)
EOS PCT: 0 % (ref 0–5)
Eosinophils Absolute: 0 10*3/uL (ref 0.0–0.7)
HEMATOCRIT: 26.2 % — AB (ref 39.0–52.0)
Hemoglobin: 8.7 g/dL — ABNORMAL LOW (ref 13.0–17.0)
Lymphocytes Relative: 5 % — ABNORMAL LOW (ref 12–46)
Lymphs Abs: 1.6 10*3/uL (ref 0.7–4.0)
MCH: 31.6 pg (ref 26.0–34.0)
MCHC: 33.2 g/dL (ref 30.0–36.0)
MCV: 95.3 fL (ref 78.0–100.0)
MONOS PCT: 5 % (ref 3–12)
Monocytes Absolute: 1.6 10*3/uL — ABNORMAL HIGH (ref 0.1–1.0)
Neutro Abs: 28.8 10*3/uL — ABNORMAL HIGH (ref 1.7–7.7)
Neutrophils Relative %: 90 % — ABNORMAL HIGH (ref 43–77)
PLATELETS: 336 10*3/uL (ref 150–400)
RBC: 2.75 MIL/uL — AB (ref 4.22–5.81)
RDW: 15.5 % (ref 11.5–15.5)
WBC: 32 10*3/uL — AB (ref 4.0–10.5)

## 2013-07-26 LAB — POCT I-STAT 3, ART BLOOD GAS (G3+)
Acid-base deficit: 6 mmol/L — ABNORMAL HIGH (ref 0.0–2.0)
BICARBONATE: 17.9 meq/L — AB (ref 20.0–24.0)
O2 Saturation: 86 %
PCO2 ART: 28.7 mmHg — AB (ref 35.0–45.0)
Patient temperature: 98
TCO2: 19 mmol/L (ref 0–100)
pH, Arterial: 7.401 (ref 7.350–7.450)
pO2, Arterial: 49 mmHg — ABNORMAL LOW (ref 80.0–100.0)

## 2013-07-26 LAB — URINALYSIS, ROUTINE W REFLEX MICROSCOPIC
GLUCOSE, UA: NEGATIVE mg/dL
HGB URINE DIPSTICK: NEGATIVE
Ketones, ur: 15 mg/dL — AB
LEUKOCYTES UA: NEGATIVE
Nitrite: NEGATIVE
PH: 5 (ref 5.0–8.0)
Protein, ur: 30 mg/dL — AB
SPECIFIC GRAVITY, URINE: 1.022 (ref 1.005–1.030)
Urobilinogen, UA: 1 mg/dL (ref 0.0–1.0)

## 2013-07-26 LAB — PROTIME-INR
INR: 1.43 (ref 0.00–1.49)
Prothrombin Time: 17.1 seconds — ABNORMAL HIGH (ref 11.6–15.2)

## 2013-07-26 LAB — POCT I-STAT, CHEM 8
BUN: 97 mg/dL — AB (ref 6–23)
CALCIUM ION: 1.48 mmol/L — AB (ref 1.13–1.30)
CREATININE: 5 mg/dL — AB (ref 0.50–1.35)
Chloride: 118 mEq/L — ABNORMAL HIGH (ref 96–112)
Glucose, Bld: 177 mg/dL — ABNORMAL HIGH (ref 70–99)
HCT: 28 % — ABNORMAL LOW (ref 39.0–52.0)
Hemoglobin: 9.5 g/dL — ABNORMAL LOW (ref 13.0–17.0)
Potassium: 2.4 mEq/L — CL (ref 3.7–5.3)
SODIUM: 157 meq/L — AB (ref 137–147)
TCO2: 20 mmol/L (ref 0–100)

## 2013-07-26 LAB — PRO B NATRIURETIC PEPTIDE: Pro B Natriuretic peptide (BNP): 3707 pg/mL — ABNORMAL HIGH (ref 0–450)

## 2013-07-26 LAB — CG4 I-STAT (LACTIC ACID): Lactic Acid, Venous: 1.87 mmol/L (ref 0.5–2.2)

## 2013-07-26 LAB — MRSA PCR SCREENING: MRSA by PCR: POSITIVE — AB

## 2013-07-26 LAB — TROPONIN I: Troponin I: <0.3 ng/mL (ref <0.30)

## 2013-07-26 MED ORDER — VANCOMYCIN HCL IN DEXTROSE 1-5 GM/200ML-% IV SOLN
1000.0000 mg | Freq: Once | INTRAVENOUS | Status: AC
  Administered 2013-07-26: 1000 mg via INTRAVENOUS
  Filled 2013-07-26: qty 200

## 2013-07-26 MED ORDER — HYDROMORPHONE HCL PF 1 MG/ML IJ SOLN
0.5000 mg | INTRAMUSCULAR | Status: AC | PRN
  Administered 2013-07-26: 0.5 mg via INTRAVENOUS
  Filled 2013-07-26: qty 1

## 2013-07-26 MED ORDER — SODIUM CHLORIDE 0.9 % IV SOLN
2.0000 mg/h | INTRAVENOUS | Status: DC
  Administered 2013-07-26: 1 mg/h via INTRAVENOUS
  Administered 2013-07-27: 2 mg/h via INTRAVENOUS
  Filled 2013-07-26: qty 10

## 2013-07-26 MED ORDER — SODIUM CHLORIDE 0.9 % IV SOLN
INTRAVENOUS | Status: DC
  Administered 2013-07-26: 13:00:00 via INTRAVENOUS

## 2013-07-26 MED ORDER — MORPHINE BOLUS VIA INFUSION
0.5000 mg | INTRAVENOUS | Status: DC | PRN
  Filled 2013-07-26: qty 1

## 2013-07-26 MED ORDER — PIPERACILLIN-TAZOBACTAM 3.375 G IVPB
3.3750 g | Freq: Once | INTRAVENOUS | Status: AC
  Administered 2013-07-26: 3.375 g via INTRAVENOUS
  Filled 2013-07-26: qty 50

## 2013-07-26 MED ORDER — SODIUM CHLORIDE 0.9 % IV SOLN: INTRAVENOUS | Status: DC

## 2013-07-26 MED ORDER — POTASSIUM CHLORIDE 10 MEQ/100ML IV SOLN
10.0000 meq | INTRAVENOUS | Status: DC
  Administered 2013-07-26: 10 meq via INTRAVENOUS
  Filled 2013-07-26: qty 100

## 2013-07-26 MED ORDER — LORAZEPAM 2 MG/ML IJ SOLN: 1.0000 mg | INTRAMUSCULAR | Status: DC | PRN

## 2013-07-26 MED ORDER — BISACODYL 10 MG RE SUPP: 10.0000 mg | Freq: Every day | RECTAL | Status: DC | PRN

## 2013-07-26 MED ORDER — SCOPOLAMINE 1 MG/3DAYS TD PT72
1.0000 | MEDICATED_PATCH | TRANSDERMAL | Status: DC
  Administered 2013-07-26: 1.5 mg via TRANSDERMAL
  Filled 2013-07-26: qty 1

## 2013-07-26 MED ORDER — ACETAMINOPHEN 650 MG RE SUPP: 650.0000 mg | RECTAL | Status: DC | PRN

## 2013-07-26 MED ORDER — ATROPINE SULFATE 1 % OP SOLN
4.0000 [drp] | OPHTHALMIC | Status: DC | PRN
  Filled 2013-07-26: qty 2

## 2013-07-26 MED ORDER — SODIUM CHLORIDE 0.9 % IV BOLUS (SEPSIS)
1000.0000 mL | Freq: Once | INTRAVENOUS | Status: AC
  Administered 2013-07-26: 1000 mL via INTRAVENOUS

## 2013-07-26 MED ORDER — ONDANSETRON HCL 4 MG/2ML IJ SOLN: 4.0000 mg | Freq: Four times a day (QID) | INTRAMUSCULAR | Status: DC | PRN

## 2013-07-26 NOTE — ED Notes (Signed)
Results of lactic acid and chem 8 given to Dr. Wyvonnia Dusky

## 2013-07-26 NOTE — ED Notes (Signed)
Pt from [facility] care, c/o altered mental status with sob. Per EMS, pt had difficulty breathing last night, put on [place] @ 2 to improve sat. Pt initial sat per ems was 84-85%. Pt had right sided rhonci which cleared with Cpap per ems. Pt can answer simple yes and no questions, states name.

## 2013-07-26 NOTE — Progress Notes (Signed)
07/26/2013 patient came from the emergency room to [facility] at 1530. He was unresponsive to person, place and time. Patient have a incision on left hip from surgery, scabs on lower legs and knees. Patient have a foley that was placed in the emergency room. Patient is palliative and place on a morphine drip Kurt [name].

## 2013-07-26 NOTE — Consult Note (Signed)
I saw the patient, participated in the history, exam and medical decision making, and concur with the physician assistant's note above.  Met family briefly Unfortunate situation but appropriate for palliative care given current condition  Kurt [name]. Redmond Pulling, MD, FACS General, Bariatric, & Minimally Invasive Surgery [facility], [place]

## 2013-07-26 NOTE — ED Provider Notes (Signed)
CSN: [id number]     Arrival date & time 07/26/13  0865 History   First MD Initiated Contact with Patient 07/26/13 [phone]     Chief Complaint  Patient presents with  . Altered Mental Status  . Shortness of Breath     (Consider location/radiation/quality/duration/timing/severity/associated sxs/prior Treatment) HPI Comments: Patient arrives via EMS with shortness of breath, altered mental status, cough congestion since last night. Patient has a history of dementia but is normally able to care and a conversation. This morning he had difficulty breathing and was increasingly confused and unable to talk. Notably patient had a recent left hip fracture and was discharged on February 12. No reported fever. No reported chest pain. EMS gave him nitroglycerin and put him on CPAP briefly for hypoxia in the 80s. Sats increased to the mid-90s on a nonrebreather.  The history is provided by the patient and the EMS personnel. The history is limited by the condition of the patient.    Past Medical History  Diagnosis Date  . Hypertension   . Hyperlipidemia   . Seizure disorder   . CKD (chronic kidney disease), stage III   . Altered mental state 08/31/2012    "came into hospital a little disoriented; it's gotten worse; this is not normal" (08/31/2012)  . Type II diabetes mellitus   . Chronic lower back pain   . Frequent falls   . brain tumor dx'd 2002    surg only; refused xrt last yr per family member  . DDD (degenerative disc disease), lumbar   . Dementia   . Seizure disorder     with left leg weakness (Todd's phenomenon)  . Dysphagia   . Assistance needed for mobility     walks with walker  . Sialoadenitis   . TIA (transient ischemic attack)    Past Surgical History  Procedure Laterality Date  . Brain tumor excision    . 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 Hip arthroplasty Left 07/17/2013    Procedure: ARTHROPLASTY BIPOLAR HIP;  Surgeon: Tobi Bastos, MD;  Location: [facility];  Service:  Orthopedics;  Laterality: Left;   Family History  Problem Relation Age of Onset  . Hypertension Daughter    History  Substance Use Topics  . Smoking status: Never Smoker   . Smokeless tobacco: Never Used  . Alcohol Use: No    Review of Systems  Unable to perform ROS: Acuity of condition  Respiratory: Positive for shortness of breath.       Allergies  Review of patient's allergies indicates no known allergies.  Home Medications   No current outpatient prescriptions on file. BP 105/51  Pulse 65  Temp(Src) 98.5 F (36.9 C) (Oral)  Resp 24  SpO2 98% Physical Exam  Constitutional: He appears well-developed and well-nourished. He appears distressed.  Respiratory distress with tachypnea, patient speaking in one to 2 word sentences.  HENT:  Head: Normocephalic and atraumatic.  Mouth/Throat: Oropharynx is clear and moist. No oropharyngeal exudate.  Eyes: Conjunctivae and EOM are normal. Pupils are equal, round, and reactive to light.  Neck: Normal range of motion. Neck supple.  Cardiovascular: Normal rate, regular rhythm and normal heart sounds.   Pulmonary/Chest: He is in respiratory distress. He has rales.  Scattered rhonchi and basilar rales  Abdominal: Soft. There is tenderness. There is guarding. There is no rebound.  Diffuse tenderness with guarding  Musculoskeletal: Normal range of motion. He exhibits no edema and no tenderness.  Left hip incision is clean and dry  and intact  Neurological: He is alert.  Confused, follows commands intermittently.    ED Course  Procedures (including critical care time) Labs Review Labs Reviewed  CBC WITH DIFFERENTIAL - Abnormal; Notable for the following:    WBC 32.0 (*)    RBC 2.75 (*)    Hemoglobin 8.7 (*)    HCT 26.2 (*)    Neutrophils Relative % 90 (*)    Lymphocytes Relative 5 (*)    Neutro Abs 28.8 (*)    Monocytes Absolute 1.6 (*)    All other components within normal limits  COMPREHENSIVE METABOLIC PANEL -  Abnormal; Notable for the following:    Sodium 156 (*)    Potassium 2.7 (*)    Chloride 117 (*)    CO2 17 (*)    Glucose, Bld 179 (*)    BUN 93 (*)    Creatinine, Ser 4.79 (*)    Albumin 2.4 (*)    AST 44 (*)    ALT 66 (*)    GFR calc non Af Amer 10 (*)    GFR calc Af Amer 12 (*)    All other components within normal limits  PRO B NATRIURETIC PEPTIDE - Abnormal; Notable for the following:    Pro B Natriuretic peptide (BNP) 3707.0 (*)    All other components within normal limits  PROTIME-INR - Abnormal; Notable for the following:    Prothrombin Time 17.1 (*)    All other components within normal limits  URINALYSIS, ROUTINE W REFLEX MICROSCOPIC - Abnormal; Notable for the following:    Color, Urine AMBER (*)    Bilirubin Urine SMALL (*)    Ketones, ur 15 (*)    Protein, ur 30 (*)    All other components within normal limits  GLUCOSE, CAPILLARY - Abnormal; Notable for the following:    Glucose-Capillary 178 (*)    All other components within normal limits  URINE MICROSCOPIC-ADD ON - Abnormal; Notable for the following:    Bacteria, UA MANY (*)    Casts HYALINE CASTS (*)    All other components within normal limits  POCT I-STAT 3, BLOOD GAS (G3+) - Abnormal; Notable for the following:    pCO2 arterial 28.7 (*)    pO2, Arterial 49.0 (*)    Bicarbonate 17.9 (*)    Acid-base deficit 6.0 (*)    All other components within normal limits  POCT I-STAT, CHEM 8 - Abnormal; Notable for the following:    Sodium 157 (*)    Potassium 2.4 (*)    Chloride 118 (*)    BUN 97 (*)    Creatinine, Ser 5.00 (*)    Glucose, Bld 177 (*)    Calcium, Ion 1.48 (*)    Hemoglobin 9.5 (*)    HCT 28.0 (*)    All other components within normal limits  POCT I-STAT TROPONIN I - Abnormal; Notable for the following:    Troponin i, poc 0.18 (*)    All other components within normal limits  CULTURE, BLOOD (ROUTINE X 2)  CULTURE, BLOOD (ROUTINE X 2)  TROPONIN I  CG4 I-STAT (LACTIC ACID)   Imaging  Review Dg Chest Portable 1 View  07/26/2013   CLINICAL DATA:  Shortness of Breath  EXAM: PORTABLE CHEST - 1 VIEW  COMPARISON:  July 18, 2013  FINDINGS: There is extensive pneumoperitoneum. There is consolidation in the left lower lobe. Lungs elsewhere are clear. Heart size and pulmonary vascularity are normal. Aorta is prominent but stable. There is no  appreciable adenopathy.  IMPRESSION: Extensive pneumoperitoneum.  Left lower lobe airspace consolidation.  Critical Value/emergent results were called by telephone at the time of interpretation on 07/26/2013 at 9:42 AM to Dr. Ezequiel Essex , who verbally acknowledged these results.   Electronically Signed   By: Lowella Grip M.D.   On: 07/26/2013 09:42      MDM   Final diagnoses:  Pneumoperitoneum  Aspiration pneumonia  Hypernatremia  Renal failure    Respiratory distress with hypoxia. Decreased mental status. Patient febrile on arrival to 101. Concern for aspiration. Broad spectrum antibiotics started. I discussed his case on arrival with his daughter Jolyne Loa who is one of his power of attorney is and she states that patient does not want resuscitation and does not want to be intubated.  I explained that his mental status is poor and his respiratory status is tenuous. His daughter confirms that he would not want to be put on the ventilator.  Portable chest x-ray shows large amount of pneumoperitoneum. D/w [facility] of surgery who will come evaluate and requests CCM involvement as patient may need to go to OR. Severe electrolyte abnormalities including hypernatremia, hypokalemia, renal failure.   Surgery has seen the patient and they feel he is not an operative candidate given his comorbidities. Discussed with the patient's daughters who are his power of attorneys. They understand he will not survive an operation. His respiratory status is poor and his mental status is poor. They agree with palliative care. Critical care at bedside  agrees she will likely not survive an operation. Patient's daughters agree with admission for palliative care and comfort.  CRITICAL CARE Performed by: Ezequiel Essex Total critical care time: 40 Critical care time was exclusive of separately billable procedures and treating other patients. Critical care was necessary to treat or prevent imminent or life-threatening deterioration. Critical care was time spent personally by me on the following activities: development of treatment plan with patient and/or surrogate as well as nursing, discussions with consultants, evaluation of patient's response to treatment, examination of patient, obtaining history from patient or surrogate, ordering and performing treatments and interventions, ordering and review of laboratory studies, ordering and review of radiographic studies, pulse oximetry and re-evaluation of patient's condition.   Ezequiel Essex, MD 07/26/13 1524

## 2013-07-26 NOTE — ED Notes (Signed)
Surgery PA and [facility]  at bedside.

## 2013-07-26 NOTE — ED Notes (Signed)
Results of troponin given to Dr. Wyvonnia Dusky

## 2013-07-26 NOTE — Consult Note (Signed)
Kurt [name] [birth year]  AT:[id number].   Requesting MD: Dr. Ezequiel Essex Chief Complaint/Reason for Consult: pneumoperitoneum HPI: This is an 80 yo black male with multiple medical problems listed below with baseline dementia.  Last week he fell and broke his left hip.  He had a hip arthroplasty done.  He was then sent to [facility] for further care.  The patient apparently become SOB and had trouble breathing at the facility.  (the patient is unable to provide a history)  He was brought to [facility] for further evaluation.  Here he is noted to have electrolyte derangement, PNA on CXR, and extensive pneumoperitoneum.  We have been asked to evaluate the patient for further recommendations.  The patient is a DNR.    ROS : unable to obtain due to decreased mental status and dementia  Family History  Problem Relation Age of Onset  . Hypertension Daughter     Past Medical History  Diagnosis Date  . Hypertension   . Hyperlipidemia   . Seizure disorder   . CKD (chronic kidney disease), stage III   . Altered mental state 08/31/2012    "came into hospital a little disoriented; it's gotten worse; this is not normal" (08/31/2012)  . Type II diabetes mellitus   . Chronic lower back pain   . Frequent falls   . brain tumor dx'd 2002    surg only; refused xrt last yr per family member  . DDD (degenerative disc disease), lumbar   . Dementia   . Seizure disorder     with left leg weakness (Todd's phenomenon)  . Dysphagia   . Assistance needed for mobility     walks with walker  . Sialoadenitis     Past Surgical History  Procedure Laterality Date  . Brain tumor excision    . 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 Hip arthroplasty Left 07/17/2013    Procedure: ARTHROPLASTY BIPOLAR HIP;  Surgeon: Tobi Bastos, MD;  Location: [facility];  Service: Orthopedics;  Laterality: Left;    Social History:  reports that he has never smoked. He has never used smokeless tobacco. He reports that he does  not drink alcohol or use illicit drugs.  Allergies: No Known Allergies   (Not in a hospital admission)  Blood pressure 115/56, pulse 79, temperature 101.1 F (38.4 C), temperature source Rectal, resp. rate 25, SpO2 93.00%. Physical Exam: General: WD, WN black male who is laying in bed on NRB with AMS HEENT: head is normocephalic, atraumatic, several scars noted from prior brain surgery.  Sclera are noninjected.  Pupils are not visualized as he keeps his eyes closed.  Ears and nose without any masses or lesions.  Mouth is pink. Heart: regular, rate, and rhythm, occasional PVCs.  Normal s1,s2. No obvious murmurs, gallops, or rubs noted.  Palpable radial and pedal pulses bilaterally Lungs: diffuse bilateral rhonchi.  NRB in place with O2 sats of 95-100% Abd: soft, some distention, diffuse tenderness, hypoactive BS, no masses, hernias, or organomegaly MS: all 4 extremities are symmetrical with no cyanosis, clubbing. Some trace lower extremity edema Skin: warm and dry with no masses, lesions, or rashes Psych: AMS and currently nonverbal.  Unable to evaluate a psych exam    Results for orders placed during the hospital encounter of 07/26/13 (from the past 48 hour(s))  CBC WITH DIFFERENTIAL     Status: Abnormal (Preliminary result)   Collection Time    07/26/13  9:30 AM      Result Value  Ref Range   WBC 32.0 (*) 4.0 - 10.5 K/uL   Comment: REPEATED TO VERIFY   RBC 2.75 (*) 4.22 - 5.81 MIL/uL   Hemoglobin 8.7 (*) 13.0 - 17.0 g/dL   HCT 26.2 (*) 39.0 - 52.0 %   MCV 95.3  78.0 - 100.0 fL   MCH 31.6  26.0 - 34.0 pg   MCHC 33.2  30.0 - 36.0 g/dL   RDW 15.5  11.5 - 15.5 %   Platelets 336  150 - 400 K/uL   Neutrophils Relative % PENDING  43 - 77 %   Neutro Abs PENDING  1.7 - 7.7 K/uL   Band Neutrophils PENDING  0 - 10 %   Lymphocytes Relative PENDING  12 - 46 %   Lymphs Abs PENDING  0.7 - 4.0 K/uL   Monocytes Relative PENDING  3 - 12 %   Monocytes Absolute PENDING  0.1 - 1.0 K/uL    Eosinophils Relative PENDING  0 - 5 %   Eosinophils Absolute PENDING  0.0 - 0.7 K/uL   Basophils Relative PENDING  0 - 1 %   Basophils Absolute PENDING  0.0 - 0.1 K/uL   WBC Morphology PENDING     RBC Morphology PENDING     Smear Review PENDING     nRBC PENDING  0 /100 WBC   Metamyelocytes Relative PENDING     Myelocytes PENDING     Promyelocytes Absolute PENDING     Blasts PENDING    POCT I-STAT 3, BLOOD GAS (G3+)     Status: Abnormal   Collection Time    07/26/13  9:38 AM      Result Value Ref Range   pH, Arterial 7.401  7.350 - 7.450   pCO2 arterial 28.7 (*) 35.0 - 45.0 mmHg   pO2, Arterial 49.0 (*) 80.0 - 100.0 mmHg   Bicarbonate 17.9 (*) 20.0 - 24.0 mEq/L   TCO2 19  0 - 100 mmol/L   O2 Saturation 86.0     Acid-base deficit 6.0 (*) 0.0 - 2.0 mmol/L   Patient temperature 98.0 F     Collection site RADIAL, ALLEN'S TEST ACCEPTABLE     Drawn by Operator     Sample type ARTERIAL    GLUCOSE, CAPILLARY     Status: Abnormal   Collection Time    07/26/13  9:43 AM      Result Value Ref Range   Glucose-Capillary 178 (*) 70 - 99 mg/dL  POCT I-STAT TROPONIN I     Status: Abnormal   Collection Time    07/26/13 10:01 AM      Result Value Ref Range   Troponin i, poc 0.18 (*) 0.00 - 0.08 ng/mL   Comment NOTIFIED PHYSICIAN     Comment 3            Comment: Due to the release kinetics of cTnI,     a negative result within the first hours     of the onset of symptoms does not rule out     myocardial infarction with certainty.     If myocardial infarction is still suspected,     repeat the test at appropriate intervals.  POCT I-STAT, CHEM 8     Status: Abnormal   Collection Time    07/26/13 10:03 AM      Result Value Ref Range   Sodium 157 (*) 137 - 147 mEq/L   Potassium 2.4 (*) 3.7 - 5.3 mEq/L   Chloride 118 (*) 96 -  112 mEq/L   BUN 97 (*) 6 - 23 mg/dL   Creatinine, Ser 5.00 (*) 0.50 - 1.35 mg/dL   Glucose, Bld 177 (*) 70 - 99 mg/dL   Calcium, Ion 1.48 (*) 1.13 - 1.30 mmol/L    TCO2 20  0 - 100 mmol/L   Hemoglobin 9.5 (*) 13.0 - 17.0 g/dL   HCT 28.0 (*) 39.0 - 52.0 %   Comment NOTIFIED PHYSICIAN    CG4 I-STAT (LACTIC ACID)     Status: None   Collection Time    07/26/13 10:03 AM      Result Value Ref Range   Lactic Acid, Venous 1.87  0.5 - 2.2 mmol/L   Dg Chest Portable 1 View  07/26/2013   CLINICAL DATA:  Shortness of Breath  EXAM: PORTABLE CHEST - 1 VIEW  COMPARISON:  July 18, 2013  FINDINGS: There is extensive pneumoperitoneum. There is consolidation in the left lower lobe. Lungs elsewhere are clear. Heart size and pulmonary vascularity are normal. Aorta is prominent but stable. There is no appreciable adenopathy.  IMPRESSION: Extensive pneumoperitoneum.  Left lower lobe airspace consolidation.  Critical Value/emergent results were called by telephone at the time of interpretation on 07/26/2013 at 9:42 AM to Dr. Ezequiel Essex , who verbally acknowledged these results.   Electronically Signed   By: Lowella Grip M.D.   On: 07/26/2013 09:42       Assessment/Plan 1. Pneumoperitoneum 2. PNA with hypoxia 3. Acute renal failure 4. Electrolyte derangement 5. Severe hypokalemia 6. Hypernatremia 7. Anemia 8. Recent hip arthroplasty 9. Leukocytosis Patient Active Problem List   Diagnosis Date Noted  . Acute kidney injury 07/20/2013  . Acute blood loss anemia 07/18/2013  . Fracture of femoral neck, left 07/17/2013  . Hip fracture 07/16/2013  . Femoral fracture 07/16/2013  . Hypoglycemia 02/23/2013  . Acute encephalopathy 02/22/2013  . Dementia 10/02/2012  . Dehydration 09/28/2012  . Dysphagia 09/28/2012  . Altered mental status 09/27/2012  . ARF (acute renal failure) 09/27/2012  . Acute Submandibular sialoadenitis 09/27/2012  . Hypernatremia 09/27/2012  . Seizure disorder, with left leg weakness (Todds phenomenon) 08/30/2012  . Hypertension   . Diabetes mellitus without complication   . Hyperlipidemia   . CKD (chronic kidney disease), stage III     Plan: 1. I have spoken to the family.  His 2 daughters are POAs.  I have explained the situation to them and the grime prognosis.  Instead of proceeding with aggressive medical care, they choose to keep the patient comfortable and make his palliative care.  All questions were answered.  The family is very understanding and appreciative.  We will sign off and defer palliative care to IM.  Thank you for this consult.    Yarissa Reining E 07/26/2013, 10:27 AM Pager: [phone]

## 2013-07-26 NOTE — Consult Note (Signed)
Patient Kurt [name]      DOB: 1928/09/25      EPP:[id number]     Consult Note from the Palliative Medicine Team at [facility] Requested by: Dr. Daleen Bo     PCP: [name], MD Reason for Consultation: [place] and options.    Phone Number:[phone]  Assessment of patients Current state: Kurt [name] is a 80 yo male admitted with acute encephalopathy and likely perforated bowel. He also has PMH of HTN, CKD, DM, dementia and seizures. He has had steady decline since his recent hip fracture repair. His family has decided to focus on comfort. I met today with his daughters: [name] [phone] HCP[employer]), [name] [phone]), and [name]" [phone] HCPOA). His daughters tell me that Kurt [name] has already been through enough and they do not want him to suffer. They say he has always been a hard worker his entire life and they believe his had fought hard to live this long. They also say that he has been married 40 years and his wife is currently at [facility] being evaluated and she suffers from Alzheimer's and is in a different facility. They believe his main concern would be to make sure they take care of their mother. He was most recently at [facility] for rehab after his hip. They say he has had pain issues there and unable to adequately rehab and progress. He has also been eating less and less. They all agree that he should not suffer and that we will focus on full comfort measures. He has prepared Living Will and has told his family what he wants for end of life and funeral arrangements. They all agree that he is ready. I will continue to follow and support and discuss disposition options once his pain is controlled and we evaluate how he does on full comfort measures overnight.    Goals of Care: 1.  Code Status: DNR   2. Scope of Treatment: 1. Vital Signs: daily  2. Respiratory/Oxygen: for comfort 3. Nutritional Support/Tube Feeds:  no 4. Antibiotics: no 5. Blood Products: no 6. IVF: KVO 7. Review of Medications to be discontinued: Minimized for comfort. 8. Labs: no 9. Telemetry: no 10. Consults: palliative   4. Disposition: Hospice facility vs GIP.   3. Symptom Management:   1. Anxiety/Agitation: Ativan prn. 2. Pain: Morphine infusion. Bolus via infusion prn.  3. Bowel Regimen: Dulcolax supp prn.  4. Fever: Acetaminophen prn.  5. Nausea/Vomiting: Ondansetron prn.  6. Terminal Secretions: Scopolamine patch. Atropine SL prn.   4. Psychosocial: Emotional support provided to daughters during difficult discussion.   5. Spiritual: Support from their personal church.   Brief HPI: 80 yo male with pneumoperitoneum on full comfort measures.   ROS: Unable to elicit - AMS.    PMH:  Past Medical History  Diagnosis Date  . Hypertension   . Hyperlipidemia   . Seizure disorder   . CKD (chronic kidney disease), stage III   . Altered mental state 08/31/2012    "came into hospital a little disoriented; it's gotten worse; this is not normal" (08/31/2012)  . Type II diabetes mellitus   . Chronic lower back pain   . Frequent falls   . brain tumor dx'd 2002    surg only; refused xrt last yr per family member  . DDD (degenerative disc disease), lumbar   . Dementia   . Seizure disorder     with left leg weakness (Todd's phenomenon)  .  Dysphagia   . Assistance needed for mobility     walks with walker  . Sialoadenitis   . TIA (transient ischemic attack)      PSH: Past Surgical History  Procedure Laterality Date  . Brain tumor excision    . 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 Hip arthroplasty Left 07/17/2013    Procedure: ARTHROPLASTY BIPOLAR HIP;  Surgeon: Tobi Bastos, MD;  Location: [facility];  Service: Orthopedics;  Laterality: Left;   I have reviewed the [place] and SH and  If appropriate update it with new information. No Known Allergies Scheduled Meds:  Continuous Infusions: . morphine 1 mg/hr  (07/26/13 1246)   PRN Meds:.HYDROmorphone (DILAUDID) injection, LORazepam    BP 105/51  Pulse 65  Temp(Src) 98.5 F (36.9 C) (Oral)  Resp 24  SpO2 98%   PPS: 20%  No intake or output data in the 24 hours ending 07/26/13 1532 LBM: 07/26/13                        Physical Exam:  General: NAD, ill appearing, obtunded HEENT: Temporal muscle wasting, eyes closed, dry mucous membranes Chest: Rhonchi throughout, bibasilar crackles CVS: RRR, S1 S2 Abdomen: Soft, tender, distended Ext: No edema, bilat feet cool to touch Neuro: Obtunded, unable to follow commands  Labs: CBC    Component Value Date/Time   WBC 32.0* 07/26/2013 0930   RBC 2.75* 07/26/2013 0930   HGB 9.5* 07/26/2013 1003   HCT 28.0* 07/26/2013 1003   PLT 336 07/26/2013 0930   MCV 95.3 07/26/2013 0930   MCH 31.6 07/26/2013 0930   MCHC 33.2 07/26/2013 0930   RDW 15.5 07/26/2013 0930   LYMPHSABS 1.6 07/26/2013 0930   MONOABS 1.6* 07/26/2013 0930   EOSABS 0.0 07/26/2013 0930   BASOSABS 0.0 07/26/2013 0930    BMET    Component Value Date/Time   NA 157* 07/26/2013 1003   K 2.4* 07/26/2013 1003   CL 118* 07/26/2013 1003   CO2 17* 07/26/2013 0930   GLUCOSE 177* 07/26/2013 1003   BUN 97* 07/26/2013 1003   CREATININE 5.00* 07/26/2013 1003   CALCIUM 10.5 07/26/2013 0930   GFRNONAA 10* 07/26/2013 0930   GFRAA 12* 07/26/2013 0930    CMP     Component Value Date/Time   NA 157* 07/26/2013 1003   K 2.4* 07/26/2013 1003   CL 118* 07/26/2013 1003   CO2 17* 07/26/2013 0930   GLUCOSE 177* 07/26/2013 1003   BUN 97* 07/26/2013 1003   CREATININE 5.00* 07/26/2013 1003   CALCIUM 10.5 07/26/2013 0930   PROT 6.9 07/26/2013 0930   ALBUMIN 2.4* 07/26/2013 0930   AST 44* 07/26/2013 0930   ALT 66* 07/26/2013 0930   ALKPHOS 52 07/26/2013 0930   BILITOT 0.6 07/26/2013 0930   GFRNONAA 10* 07/26/2013 0930   GFRAA 12* 07/26/2013 0930    Time In Time Out Total Time Spent with Patient Total Overall Time  1445 1555 47min 103min    Greater than 50%  of this  time was spent counseling and coordinating care related to the above assessment and plan.  Vinie Sill, NP Palliative Medicine Team Pager # [phone] Team Phone # [phone]

## 2013-07-26 NOTE — Consult Note (Signed)
PULMONARY / CRITICAL CARE MEDICINE  Name: Kurt [name] MRN: [id number] DOB: Jan 27, 1929    ADMISSION DATE:  07/26/2013 CONSULTATION DATE:  07/26/2013  REFERRING MD :  EDP PRIMARY SERVICE: TRH  CHIEF COMPLAINT:  Dyspnea  BRIEF PATIENT DESCRIPTION: 80 yo who recently (02/08) underwent L hip arthroplasty for hip fx and was discharged to NH presented with complaint of dyspnea and acute encephalopathy.  CXR in ED revealed extensive pneumoperitoneum and LLL consolidation.  SIGNIFICANT EVENTS / STUDIES:  2/08  Left hip arthroplasty ([name]) 2/15  Presented with dyspnea / encephalopathy, CXR revealed extensive pneumoperitoneum.  LINES / TUBES: Foley 2/17 >>>  CULTURES: 2/17  Blood  >>>  ANTIBIOTICS:  The patient is encephalopathic and unable to provide history, which was obtained for available medical records.  HISTORY OF PRESENT ILLNESS:  80 yo who recently (02/08) underwent L hip arthroplasty for hip fx and was discharged to NH presented with complaint of dyspnea and acute encephalopathy.  CXR in ED revealed extensive pneumoperitoneum and LLL consolidation.  PAST MEDICAL HISTORY :  Past Medical History  Diagnosis Date  . Hypertension   . Hyperlipidemia   . Seizure disorder   . CKD (chronic kidney disease), stage III   . Altered mental state 08/31/2012    "came into hospital a little disoriented; it's gotten worse; this is not normal" (08/31/2012)  . Type II diabetes mellitus   . Chronic lower back pain   . Frequent falls   . brain tumor dx'd 2002    surg only; refused xrt last yr per family member  . DDD (degenerative disc disease), lumbar   . Dementia   . Seizure disorder     with left leg weakness (Todd's phenomenon)  . Dysphagia   . Assistance needed for mobility     walks with walker  . Sialoadenitis    Past Surgical History  Procedure Laterality Date  . Brain tumor excision    . 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 Hip arthroplasty Left 07/17/2013   Procedure: ARTHROPLASTY BIPOLAR HIP;  Surgeon: Tobi Bastos, MD;  Location: [facility];  Service: Orthopedics;  Laterality: Left;   Prior to Admission medications   Medication Sig Start Date End Date Taking? Authorizing Provider  amLODipine (NORVASC) 2.5 MG tablet Take 2.5 mg by mouth daily.   Yes Historical Provider, MD  aspirin EC 325 MG EC tablet Take 1 tablet (325 mg total) by mouth 2 (two) times daily. 07/20/13  Yes Amber Renelda Loma, PA-C  atenolol (TENORMIN) 50 MG tablet Take 50 mg by mouth daily.   Yes Historical Provider, MD  bisacodyl (DULCOLAX) 10 MG suppository Place 1 suppository (10 mg total) rectally daily as needed for moderate constipation. 07/21/13  Yes Janece Canterbury, MD  cholecalciferol (VITAMIN D) 400 UNITS TABS tablet Take 400 Units by mouth daily.   Yes Historical Provider, MD  divalproex (DEPAKOTE SPRINKLE) 125 MG capsule Take 125-250 mg by mouth 4 (four) times daily. Take 2 capsules by mouth at 8AM, then Take 1 capsule by mouth at noon, and 1 capsule every 24 hours, as well as another 1 capsule every 24 hours.   Yes Historical Provider, MD  ferrous sulfate 325 (65 FE) MG tablet Take 1 tablet (325 mg total) by mouth 3 (three) times daily with meals. 07/21/13  Yes Janece Canterbury, MD  finasteride (PROSCAR) 5 MG tablet Take 5 mg by mouth daily.   Yes Historical Provider, MD  HYDROcodone-acetaminophen (NORCO/VICODIN) 5-325 MG per tablet Take 1-2 tablets  by mouth every 6 (six) hours as needed for moderate pain. 07/20/13  Yes Lucille Passy Babish, PA-C  levETIRAcetam (KEPPRA) 500 MG tablet Take 500 mg by mouth every 12 (twelve) hours.   Yes Historical Provider, MD  LORazepam (ATIVAN) 1 MG tablet Take 1 tablet (1 mg total) by mouth every 6 (six) hours as needed for anxiety. 07/21/13  Yes Janece Canterbury, MD  Memantine HCl ER (NAMENDA XR) 14 MG CP24 Take 1 capsule by mouth daily. For 7 days 02/18/13 07/26/13 Yes Historical Provider, MD  Memantine HCl ER (NAMENDA XR) 28 MG CP24 Take 1  capsule by mouth daily.   Yes Historical Provider, MD  Multiple Vitamin (MULTIVITAMIN WITH MINERALS) TABS tablet Take 1 tablet by mouth daily.   Yes Historical Provider, MD  pantoprazole (PROTONIX) 40 MG tablet Take 40 mg by mouth daily.   Yes Historical Provider, MD  polyethylene glycol (MIRALAX / GLYCOLAX) packet Take 17 g by mouth daily as needed for mild constipation. 07/21/13  Yes Janece Canterbury, MD  simvastatin (ZOCOR) 20 MG tablet Take 20 mg by mouth every evening.   Yes Historical Provider, MD  TUBERCULIN PPD ID Inject 0.1 mLs into the skin See admin instructions. Every evening shift, every 10 days for 11 days.   Yes Historical Provider, MD   No Known Allergies  FAMILY HISTORY:  Family History  Problem Relation Age of Onset  . Hypertension Daughter    SOCIAL HISTORY:  reports that he has never smoked. He has never used smokeless tobacco. He reports that he does not drink alcohol or use illicit drugs.  REVIEW OF SYSTEMS:  Unable to complete as pt is unable to answer questions.  INTERVAL HISTORY:  VITAL SIGNS: Temp:  [101.1 F (38.4 C)] 101.1 F (38.4 C) (02/17 0927) Pulse Rate:  [79] 79 (02/17 0927) Resp:  [25] 25 (02/17 0927) BP: (115)/(56) 115/56 mmHg (02/17 0927) SpO2:  [86 %-95 %] 93 % (02/17 0938)  HEMODYNAMICS:   PHYSICAL EXAMINATION: General:  Elderly male, ill appearing, on NRB in NAD. Neuro:  Unable to assess neuro status.  Responds to pain and verbal stimulation. HEENT:  [place]/AT.  PERRL. NRB in place. Cardiovascular:  RRR, no M/R/G. Lungs:  Resp's even and unlabored.  Coarse sounds throughout. Abdomen:  Hypoactive BS.  Tender to palpation. Mild-moderate distention, not rigid. Musculoskeletal:  No gross deformities.  No edema. Skin:  Warm and Intact.  LABS:  CBC  Recent Labs Lab 07/20/13 0410  07/21/13 0447 07/26/13 0930 07/26/13 1003  WBC 9.3  --  9.3 32.0*  --   HGB 6.5*  < > 7.6* 8.7* 9.5*  HCT 19.4*  < > 22.4* 26.2* 28.0*  PLT 126*  --  156 336   --   < > = values in this interval not displayed.  BMET  Recent Labs Lab 07/20/13 0410 07/21/13 0447 07/26/13 1003  NA 138 142 157*  K 3.9 3.2* 2.4*  CL 105 109 118*  CO2 22 21  --   BUN 53* 49* 97*  CREATININE 2.31* 1.98* 5.00*  GLUCOSE 122* 157* 177*   Electrolytes  Recent Labs Lab 07/20/13 0410 07/21/13 0447  CALCIUM 8.8 9.0   Sepsis Markers  Recent Labs Lab 07/26/13 1003  LATICACIDVEN 1.87   ABG  Recent Labs Lab 07/26/13 0938  PHART 7.401  PCO2ART 28.7*  PO2ART 49.0*   Liver Enzymes No results found for this basename: AST, ALT, ALKPHOS, BILITOT, ALBUMIN,  in the last 168 hours  Cardiac Enzymes No  results found for this basename: TROPONINI, PROBNP,  in the last 168 hours  Glucose  Recent Labs Lab 07/20/13 0719 07/20/13 1105 07/20/13 1703 07/20/13 2136 07/21/13 0719 07/26/13 0943  GLUCAP 103* 112* 145* 156* 130* 178*   Imaging Dg Chest Portable 1 View  07/26/2013   CLINICAL DATA:  Shortness of Breath  EXAM: PORTABLE CHEST - 1 VIEW  COMPARISON:  July 18, 2013  FINDINGS: There is extensive pneumoperitoneum. There is consolidation in the left lower lobe. Lungs elsewhere are clear. Heart size and pulmonary vascularity are normal. Aorta is prominent but stable. There is no appreciable adenopathy.  IMPRESSION: Extensive pneumoperitoneum.  Left lower lobe airspace consolidation.  Critical Value/emergent results were called by telephone at the time of interpretation on 07/26/2013 at 9:42 AM to Dr. Ezequiel Essex , who verbally acknowledged these results.   Electronically Signed   By: Lowella Grip M.D.   On: 07/26/2013 09:42   ASSESSMENT AND PLAN:  Likely perforated viscus Suspected peritonitis Sepsis Aspiration pneumonia Acute respiratory failure Dehydration Hypernatremia Hypokalemia AKI Hyperglycemia Palliative care encounter  --> Extensive family discussion with patients family at bedside ( two sisters who are both power of attorney),  as well as EDP, General Surgery team and PCCM.  Situation explained in depth and that although the only way to correct the issue would be an operation, the risk involved with surgery is extremely high.  Patients family acknowledged risks and agreed that it would be too much for Mr. Matthes to handle.  They have decided against surgery and would like to proceed with palliative care. They understand that unfortunately this means that Mr. Aloia will pass away, likely in the near future.   All family questions answered at bedside. PCCM will sign off, please do not hesitate to contact us if needed.  Thank you for allowing [place] to participate in Mr. Dollins's care.  Montey Hora, [facility] Pgr: [phone]  or [phone]  I have personally obtained history, examined patient, evaluated and interpreted laboratory and imaging results, reviewed medical records, formulated assessment / plan and placed orders.  CRITICAL CARE:  The patient is critically ill with multiple organ systems failure and requires high complexity decision making for assessment and support, frequent evaluation and titration of therapies, application of advanced monitoring technologies and extensive interpretation of multiple databases. Critical Care Time devoted to patient care services described in this note is 35 minutes.   Doree Fudge, MD [facility] [place] Pager: [phone]  07/26/2013, 11:23 AM

## 2013-07-26 NOTE — H&P (Signed)
[facility] History and Physical  Kurt [name] [id number] DOB: 05/26/1929 DOA: 07/26/2013  Referring physician:  PCP: [name], MD  Specialists:   Chief Complaint: change in mental status   HPI: Kurt [name] is a 80 y.o. male with PMH of HTN, HPL, CKD, DM, dementia, seizures who recently discharged to SNF post hip fracture surgery is being admitted with acute encephalopathy and found to have extensive pneumoperitoneum likely perforated bowel; he was seen by critical care, surgery and per family request he is made DNR, comfort care; patient had fever, SOB, abdominal pain; no nausea vomiting  -patient is obtunded could not provide any information   Review of Systems: The patient denies anorexia, fever, weight loss,, vision loss, decreased hearing, hoarseness, chest pain, syncope, dyspnea on exertion, peripheral edema, balance deficits, hemoptysis, abdominal pain, melena, hematochezia, severe indigestion/heartburn, hematuria, incontinence, genital sores, muscle weakness, suspicious skin lesions, transient blindness, difficulty walking, depression, unusual weight change, abnormal bleeding, enlarged lymph nodes, angioedema, and breast masses.    Past Medical History  Diagnosis Date  . Hypertension   . Hyperlipidemia   . Seizure disorder   . CKD (chronic kidney disease), stage III   . Altered mental state 08/31/2012    "came into hospital a little disoriented; it's gotten worse; this is not normal" (08/31/2012)  . Type II diabetes mellitus   . Chronic lower back pain   . Frequent falls   . brain tumor dx'd 2002    surg only; refused xrt last yr per family member  . DDD (degenerative disc disease), lumbar   . Dementia   . Seizure disorder     with left leg weakness (Todd's phenomenon)  . Dysphagia   . Assistance needed for mobility     walks with walker  . Sialoadenitis    Past Surgical History  Procedure Laterality Date  . Brain tumor excision    . Brain meningioma  excision  08/2000  . Hip arthroplasty Left 07/17/2013    Procedure: ARTHROPLASTY BIPOLAR HIP;  Surgeon: Tobi Bastos, MD;  Location: [facility];  Service: Orthopedics;  Laterality: Left;   Social History:  reports that he has never smoked. He has never used smokeless tobacco. He reports that he does not drink alcohol or use illicit drugs. NH where does patient live--home, ALF, SNF? and with whom if at home? no Can patient participate in ADLs?  No Known Allergies  Family History  Problem Relation Age of Onset  . Hypertension Daughter    no h/o CAD (be sure to complete)  Prior to Admission medications   Medication Sig Start Date End Date Taking? Authorizing Provider  amLODipine (NORVASC) 2.5 MG tablet Take 2.5 mg by mouth daily.   Yes Historical Provider, MD  aspirin EC 325 MG EC tablet Take 1 tablet (325 mg total) by mouth 2 (two) times daily. 07/20/13  Yes Amber Renelda Loma, PA-C  atenolol (TENORMIN) 50 MG tablet Take 50 mg by mouth daily.   Yes Historical Provider, MD  bisacodyl (DULCOLAX) 10 MG suppository Place 1 suppository (10 mg total) rectally daily as needed for moderate constipation. 07/21/13  Yes Janece Canterbury, MD  cholecalciferol (VITAMIN D) 400 UNITS TABS tablet Take 400 Units by mouth daily.   Yes Historical Provider, MD  divalproex (DEPAKOTE SPRINKLE) 125 MG capsule Take 125-250 mg by mouth 4 (four) times daily. Take 2 capsules by mouth at 8AM, then Take 1 capsule by mouth at noon, and 1 capsule every 24 hours, as well as  another 1 capsule every 24 hours.   Yes Historical Provider, MD  ferrous sulfate 325 (65 FE) MG tablet Take 1 tablet (325 mg total) by mouth 3 (three) times daily with meals. 07/21/13  Yes Janece Canterbury, MD  finasteride (PROSCAR) 5 MG tablet Take 5 mg by mouth daily.   Yes Historical Provider, MD  HYDROcodone-acetaminophen (NORCO/VICODIN) 5-325 MG per tablet Take 1-2 tablets by mouth every 6 (six) hours as needed for moderate pain. 07/20/13  Yes Lucille Passy Babish, PA-C  levETIRAcetam (KEPPRA) 500 MG tablet Take 500 mg by mouth every 12 (twelve) hours.   Yes Historical Provider, MD  LORazepam (ATIVAN) 1 MG tablet Take 1 tablet (1 mg total) by mouth every 6 (six) hours as needed for anxiety. 07/21/13  Yes Janece Canterbury, MD  Memantine HCl ER (NAMENDA XR) 14 MG CP24 Take 1 capsule by mouth daily. For 7 days 02/18/13 07/26/13 Yes Historical Provider, MD  Memantine HCl ER (NAMENDA XR) 28 MG CP24 Take 1 capsule by mouth daily.   Yes Historical Provider, MD  Multiple Vitamin (MULTIVITAMIN WITH MINERALS) TABS tablet Take 1 tablet by mouth daily.   Yes Historical Provider, MD  pantoprazole (PROTONIX) 40 MG tablet Take 40 mg by mouth daily.   Yes Historical Provider, MD  polyethylene glycol (MIRALAX / GLYCOLAX) packet Take 17 g by mouth daily as needed for mild constipation. 07/21/13  Yes Janece Canterbury, MD  simvastatin (ZOCOR) 20 MG tablet Take 20 mg by mouth every evening.   Yes Historical Provider, MD  TUBERCULIN PPD ID Inject 0.1 mLs into the skin See admin instructions. Every evening shift, every 10 days for 11 days.   Yes Historical Provider, MD   Physical Exam: Filed Vitals:   07/26/13 0927  BP: 115/56  Pulse: 79  Temp: 101.1 F (38.4 C)  Resp: 25     General:  Obtunded   Eyes: perrla   ENT: no oral uclers  Neck: supple   Cardiovascular: s1,s2 rrr  Respiratory: BL LL crackles   Abdomen: soft, tender, distended   Skin: no rash  Musculoskeletal: no LE edema   Psychiatric: no hallucinations   Neurologic: obtunded, does not follow commands   Labs on Admission:  Basic Metabolic Panel:  Recent Labs Lab 07/20/13 0410 07/21/13 0447 07/26/13 1003  NA 138 142 157*  K 3.9 3.2* 2.4*  CL 105 109 118*  CO2 22 21  --   GLUCOSE 122* 157* 177*  BUN 53* 49* 97*  CREATININE 2.31* 1.98* 5.00*  CALCIUM 8.8 9.0  --    Liver Function Tests: No results found for this basename: AST, ALT, ALKPHOS, BILITOT, PROT, ALBUMIN,  in the  last 168 hours No results found for this basename: LIPASE, AMYLASE,  in the last 168 hours No results found for this basename: AMMONIA,  in the last 168 hours CBC:  Recent Labs Lab 07/20/13 0410 07/20/13 1415 07/21/13 0447 07/26/13 0930 07/26/13 1003  WBC 9.3  --  9.3 32.0*  --   NEUTROABS  --   --   --  28.8*  --   HGB 6.5* 7.7* 7.6* 8.7* 9.5*  HCT 19.4* 22.0* 22.4* 26.2* 28.0*  MCV 91.1  --  91.1 95.3  --   PLT 126*  --  156 336  --    Cardiac Enzymes: No results found for this basename: CKTOTAL, CKMB, CKMBINDEX, TROPONINI,  in the last 168 hours  BNP (last 3 results) No results found for this basename: PROBNP,  in the  last 8760 hours CBG:  Recent Labs Lab 07/20/13 1105 07/20/13 1703 07/20/13 2136 07/21/13 0719 07/26/13 0943  GLUCAP 112* 145* 156* 130* 178*    Radiological Exams on Admission: Dg Chest Portable 1 View  07/26/2013   CLINICAL DATA:  Shortness of Breath  EXAM: PORTABLE CHEST - 1 VIEW  COMPARISON:  July 18, 2013  FINDINGS: There is extensive pneumoperitoneum. There is consolidation in the left lower lobe. Lungs elsewhere are clear. Heart size and pulmonary vascularity are normal. Aorta is prominent but stable. There is no appreciable adenopathy.  IMPRESSION: Extensive pneumoperitoneum.  Left lower lobe airspace consolidation.  Critical Value/emergent results were called by telephone at the time of interpretation on 07/26/2013 at 9:42 AM to Dr. Ezequiel Essex , who verbally acknowledged these results.   Electronically Signed   By: Lowella Grip M.D.   On: 07/26/2013 09:42    EKG: Independently reviewed. Sinus, rhythm, PVC  Assessment/Plan Principal Problem:   Pneumoperitoneum Active Problems:   Hypertension   Diabetes mellitus without complication   Hyperlipidemia   CKD (chronic kidney disease), stage III   ARF (acute renal failure)   Dehydration  80 y.o. male with PMH of HTN, HPL, CKD, DM, dementia, seizures who recently discharged to SNF  post hip fracture surgery is being admitted with acute encephalopathy and found to have extensive pneumoperitoneum likely perforated bowel  1. Pneumoperitoneum/sepsis/sirs/penumonia  2. Acute respiratory failure/pneumonnia  3. Hyper Na/hypo K/dehydration/AKI  Patient has very poor prognosis; d/w family at the bedside; he is DNR/comfort care -started comfort care measures  Surgery, critical care if consultant consulted, please document name and whether formally or informally consulted  Code Status: DNR (must indicate code status--if unknown or must be presumed, indicate so) Family Communication: d/w his daughters, son (indicate person spoken with, if applicable, with phone number if by telephone) Disposition Plan: likely hospice (indicate anticipated LOS)  Time spent: >35 minutes   [place], [facility] Pager [phone]  If 7PM-7AM, please contact night-coverage [email or web address] Password [email or web address] 07/26/2013, 11:18 AM

## 2013-07-27 MED ORDER — LORAZEPAM 2 MG/ML IJ SOLN
1.0000 mg | INTRAMUSCULAR | Status: AC
  Administered 2013-07-27: 1 mg via INTRAVENOUS
  Filled 2013-07-27: qty 1

## 2013-07-27 MED ORDER — LORAZEPAM 2 MG/ML IJ SOLN
1.0000 mg | INTRAMUSCULAR | Status: DC
  Administered 2013-07-27 – 2013-07-28 (×6): 1 mg via INTRAVENOUS
  Filled 2013-07-27 (×6): qty 1

## 2013-07-27 NOTE — Progress Notes (Signed)
Nutrition Brief Note  Chart reviewed. Pt now transitioning to comfort care.  No further nutrition interventions warranted at this time.  Please re-consult as needed.   Ijeoma Loor MS, RD, LDN Inpatient Registered Dietitian Pager: [phone] After-hours pager: [phone]    

## 2013-07-27 NOTE — Progress Notes (Signed)
Utilization review completed.  

## 2013-07-27 NOTE — Progress Notes (Signed)
[facility] PROGRESS NOTE  Kurt [name] [id number] DOB: 1929-01-29 DOA: 07/26/2013 PCP: [name], MD  Assessment/Plan: Sepsis -Secondary to pneumoperitoneum and pneumonia -No longer an active issue as the patient's focus of care has been changed to comfort focused Pneumoperitoneum/peritonitis -Likely due to perforated viscus -No longer an active issue as the patient's focus of care has been changed to comfort focused Pneumonia -Likely due to aspiration -No longer an active issue as the patient's focus of care has been changed to comfort focused Acute encephalopathy -Due to sepsis and renal failure Acute on chronic renal failure(CKD stage III) -Due to infectious process and volume depletion/dehydration -No longer an active issue as the patient's focus of care has been changed to comfort focused Acute respiratory failure -Secondary to pneumonia GOC -Extensive family discussion with patients family at bedside ( two sisters who are both power of attorney), as well as EDP, General Surgery team and PCCM. -Family decided against surgery -Requested palliative medicine consultation -After meeting with palliative medicine, the patient was transitioned to comfort care focused care   Family Communication:   Pt at beside Disposition Plan:   GIP        Procedures/Studies: Dg Chest 1 View  07/18/2013   CLINICAL DATA:  Status post left hip arthroplasty for fracture.  EXAM: CHEST - 1 VIEW  COMPARISON:  DG RIBS UNILATERAL W/CHEST*R* dated 05/02/2013; DG CHEST 1V PORT dated 02/22/2013; DG KNEE1- 2 VIEWS*L* dated 07/16/2013; DG HIP COMPLETE*L* dated 07/16/2013  FINDINGS: The heart size is normal. The aorta is ectatic. There is no evidence of pulmonary edema, consolidation, pneumothorax, nodule or pleural fluid.  IMPRESSION: No active disease.   Electronically Signed   By: Aletta Edouard M.D.   On: 07/18/2013 16:08   Dg Hip Complete Left  07/16/2013   CLINICAL DATA:  Left hip pain  after fall.  EXAM: LEFT HIP - COMPLETE 2+ VIEW  COMPARISON:  None available for comparison at time of study interpretation.  FINDINGS: Left subcapital femur fracture with varus angulation distal bony fragments, impaction. Femoral head is well formed and located. No dislocation.  Bone mineral density is decreased without destructive bony lesions. Periarticular soft tissue planes are nonsuspicious.  IMPRESSION: Angulated impacted left subcapital femur fracture without dislocation.   Electronically Signed   By: Elon Alas   On: 07/16/2013 22:36   Dg Knee 1-2 Views Left  07/16/2013   CLINICAL DATA:  Fall with left knee pain.  EXAM: LEFT KNEE - 1-2 VIEW  COMPARISON:  None.  FINDINGS: Oblique images were not obtained due to limited patient mobility because of a hip fracture.  Bones are osteopenic. There is no evidence of acute fracture or dislocation. No joint effusion is seen. Patellar enthesophytes are present. Medial and lateral compartment joint spaces are maintained. Vascular calcifications are present.  IMPRESSION: No evidence of acute osseous abnormality involving the left knee.   Electronically Signed   By: Logan Bores   On: 07/16/2013 22:38   US Renal  07/19/2013   CLINICAL DATA:  Worsening creatinine.  EXAM: RENAL/URINARY TRACT ULTRASOUND COMPLETE  COMPARISON:  09/27/2012  FINDINGS: Right Kidney:  Length: 13.5 cm. There multiple cysts. Normal parenchymal echogenicity. No solid renal masses. Largest cyst arises from the lower pole measuring 9.2 cm in greatest dimension. 4 cm cyst arises from the upper pole. No hydronephrosis.  Left Kidney:  Length: 11.9 cm. Normal echogenicity. Multiple cysts. There are 2 upper pole cysts, the larger measuring 5 cm with the smaller cyst just below  this measuring 3.9 cm no solid renal masses, no stones and no hydronephrosis.  Bladder:  Decompressed by a Foley catheter  Incidental note is made of gallstones.  IMPRESSION: 1. Normal renal sizes and overall parenchymal  echogenicity. 2. No hydronephrosis. 3. Bilateral renal cysts, essentially stable.   Electronically Signed   By: Lajean Manes M.D.   On: 07/19/2013 15:25   Dg Pelvis Portable  07/17/2013   CLINICAL DATA:  Surgery for a left femoral neck fracture.  EXAM: PORTABLE PELVIS 1-2 VIEWS  COMPARISON:  None.  FINDINGS: Left hip prosthesis in satisfactory position and alignment. No fracture or dislocation seen on a single frontal view. Diffuse osteopenia.  IMPRESSION: Satisfactory postoperative appearance of a left hip prosthesis.   Electronically Signed   By: Enrique Sack M.D.   On: 07/17/2013 13:30   Dg Chest Portable 1 View  07/26/2013   CLINICAL DATA:  Shortness of Breath  EXAM: PORTABLE CHEST - 1 VIEW  COMPARISON:  July 18, 2013  FINDINGS: There is extensive pneumoperitoneum. There is consolidation in the left lower lobe. Lungs elsewhere are clear. Heart size and pulmonary vascularity are normal. Aorta is prominent but stable. There is no appreciable adenopathy.  IMPRESSION: Extensive pneumoperitoneum.  Left lower lobe airspace consolidation.  Critical Value/emergent results were called by telephone at the time of interpretation on 07/26/2013 at 9:42 AM to Dr. Ezequiel Essex , who verbally acknowledged these results.   Electronically Signed   By: Lowella Grip M.D.   On: 07/26/2013 09:42         Subjective: Patient is resting comfortably with nonrebreather. No signs of respiratory distress, uncontrolled pain, vomiting  Objective: Filed Vitals:   07/26/13 1100 07/26/13 1145 07/26/13 1518 07/27/13 0843  BP: 128/53 136/53 105/51 119/37  Pulse:  76 65 58  Temp:   98.5 F (36.9 C) 98.1 F (36.7 C)  TempSrc:   Oral Oral  Resp: 24 22 24 22   SpO2:  96% 98% 92%    Intake/Output Summary (Last 24 hours) at 07/27/13 1923 Last data filed at 07/27/13 1450  Gross per 24 hour  Intake 269.23 ml  Output    400 ml  Net -130.77 ml   Weight change:  Exam:   General:  Pt is not in acute  distress  HEENT: No icterus, No thrush, [place]/AT  Cardiovascular: RRR, S1/S2, no rubs, no gallops  Respiratory: Bibasilar crackles, right greater than left, no wheezing  Abdomen: Soft/+BS,, non distended, no guarding  Extremities: 1+LE edema, No lymphangitis, No petechiae, No rashes, no synovitis  Data Reviewed: Basic Metabolic Panel:  Recent Labs Lab 07/21/13 0447 07/26/13 0930 07/26/13 1003  NA 142 156* 157*  K 3.2* 2.7* 2.4*  CL 109 117* 118*  CO2 21 17*  --   GLUCOSE 157* 179* 177*  BUN 49* 93* 97*  CREATININE 1.98* 4.79* 5.00*  CALCIUM 9.0 10.5  --    Liver Function Tests:  Recent Labs Lab 07/26/13 0930  AST 44*  ALT 66*  ALKPHOS 52  BILITOT 0.6  PROT 6.9  ALBUMIN 2.4*   No results found for this basename: LIPASE, AMYLASE,  in the last 168 hours No results found for this basename: AMMONIA,  in the last 168 hours CBC:  Recent Labs Lab 07/21/13 0447 07/26/13 0930 07/26/13 1003  WBC 9.3 32.0*  --   NEUTROABS  --  28.8*  --   HGB 7.6* 8.7* 9.5*  HCT 22.4* 26.2* 28.0*  MCV 91.1 95.3  --  PLT 156 336  --    Cardiac Enzymes:  Recent Labs Lab 07/26/13 0930  TROPONINI <0.30   BNP: No components found with this basename: POCBNP,  CBG:  Recent Labs Lab 07/20/13 2136 07/21/13 0719 07/26/13 0943  GLUCAP 156* 130* 178*    Recent Results (from the past 240 hour(s))  URINE CULTURE     Status: None   Collection Time    07/18/13  3:52 PM      Result Value Ref Range Status   Specimen Description URINE, RANDOM   Final   Special Requests NONE   Final   Culture  Setup Time     Final   Value: 07/18/2013 21:01     Performed at [place]     Final   Value: NO GROWTH     Performed at [facility]   Culture     Final   Value: NO GROWTH     Performed at [facility]   Report Status 07/19/2013 FINAL   Final  CLOSTRIDIUM DIFFICILE BY PCR     Status: None   Collection Time    07/19/13 10:51 PM      Result  Value Ref Range Status   C difficile by pcr NEGATIVE  NEGATIVE Final   Comment: Performed at [facility], BLOOD (ROUTINE X 2)     Status: None   Collection Time    07/26/13  9:30 AM      Result Value Ref Range Status   Specimen Description BLOOD RIGHT FOREARM   Final   Special Requests BOTTLES DRAWN AEROBIC AND ANAEROBIC 10CC   Final   Culture  Setup Time     Final   Value: 07/26/2013 14:00     Performed at [facility]   Culture     Final   Value:        BLOOD CULTURE RECEIVED NO GROWTH TO DATE CULTURE WILL BE HELD FOR 5 DAYS BEFORE ISSUING A FINAL NEGATIVE REPORT     Performed at [facility]   Report Status PENDING   Incomplete  CULTURE, BLOOD (ROUTINE X 2)     Status: None   Collection Time    07/26/13  9:45 AM      Result Value Ref Range Status   Specimen Description BLOOD HAND RIGHT   Final   Special Requests BOTTLES DRAWN AEROBIC AND ANAEROBIC 8CC   Final   Culture  Setup Time     Final   Value: 07/26/2013 14:01     Performed at [facility]   Culture     Final   Value:        BLOOD CULTURE RECEIVED NO GROWTH TO DATE CULTURE WILL BE HELD FOR 5 DAYS BEFORE ISSUING A FINAL NEGATIVE REPORT     Performed at [facility]   Report Status PENDING   Incomplete  MRSA PCR SCREENING     Status: Abnormal   Collection Time    07/26/13  4:47 PM      Result Value Ref Range Status   MRSA by PCR POSITIVE (*) NEGATIVE Final   Comment:            The GeneXpert MRSA Assay (FDA     approved for NASAL specimens     only), is one component of a     comprehensive MRSA colonization     surveillance program. It is not  intended to diagnose MRSA     infection nor to guide or     monitor treatment for     MRSA infections.     RESULT CALLED TO, READ BACK BY AND VERIFIED WITH:     T.Knighton,RN AT 2255 BY L.PITT 07/26/13     Scheduled Meds: . LORazepam  1 mg Intravenous Q4H  . scopolamine  1 patch Transdermal Q72H   Continuous  Infusions: . sodium chloride 20 mL/hr at 07/26/13 1745  . morphine 2 mg/hr (07/27/13 1226)     Gweneth Fredlund, Shonte, DO  [facility] Pager [phone]  If 7PM-7AM, please contact night-coverage [email or web address] Password [email or web address] 07/27/2013, 7:23 PM   LOS: 1 day

## 2013-07-27 NOTE — Progress Notes (Signed)
Progress Note from the Palliative Medicine Team at [facility]: Kurt [name] appears slightly agitated this morning and is "grunting" with each breath. I have asked nursing to give him a dose of Ativan and then to follow up with bolus via Morphine infusion if he is not relieved with the Ativan. I spoke briefly with his daughter, Kurt [name], who says that the family is unable to get to the hospital this morning due to weather and road conditions but they are planning on being here as soon as they are able. I updated her on her father that I believe him to be declining since I saw him yesterday afternoon which we expected with his assumed perforated bowel and renal failure. He is less responsive and more agitated. I will continue to follow and support Kurt [name] and his family.  This afternoon Kurt [name] continues to appear agitated and uncomfortable so I will increase his morphine infusion and will schedule his Ativan as nursing believed this helped. I will continue to monitor symptoms closely.   Objective: No Known Allergies Scheduled Meds: . LORazepam  1 mg Intravenous NOW  . scopolamine  1 patch Transdermal Q72H   Continuous Infusions: . sodium chloride 20 mL/hr at 07/26/13 1745  . morphine 1 mg/hr (07/26/13 1246)   PRN Meds:.acetaminophen, atropine, bisacodyl, LORazepam, morphine, ondansetron (ZOFRAN) IV  BP 105/51  Pulse 65  Temp(Src) 98.5 F (36.9 C) (Oral)  Resp 24  SpO2 98%   PPS: 10%  Pain Score: non-verbal    Intake/Output Summary (Last 24 hours) at 07/27/13 0827 Last data filed at 07/27/13 5427  Gross per 24 hour  Intake 269.23 ml  Output    400 ml  Net -130.77 ml      LBM: 07/26/13      Physical Exam:  General: NAD, ill appearing, unresponsive HEENT: Temporal muscle wasting, eyes closed, dry mucous membranes Chest: Rhonchi and rales throughout, no labored breathing or tachypnea CVS: RRR, S1 S2 Abdomen: Tender, slightly distended Ext: No edema, bilat feet  cool to touch Neuro: Unresponsive, unable to follow commands, agitated  Labs: CBC    Component Value Date/Time   WBC 32.0* 07/26/2013 0930   RBC 2.75* 07/26/2013 0930   HGB 9.5* 07/26/2013 1003   HCT 28.0* 07/26/2013 1003   PLT 336 07/26/2013 0930   MCV 95.3 07/26/2013 0930   MCH 31.6 07/26/2013 0930   MCHC 33.2 07/26/2013 0930   RDW 15.5 07/26/2013 0930   LYMPHSABS 1.6 07/26/2013 0930   MONOABS 1.6* 07/26/2013 0930   EOSABS 0.0 07/26/2013 0930   BASOSABS 0.0 07/26/2013 0930    BMET    Component Value Date/Time   NA 157* 07/26/2013 1003   K 2.4* 07/26/2013 1003   CL 118* 07/26/2013 1003   CO2 17* 07/26/2013 0930   GLUCOSE 177* 07/26/2013 1003   BUN 97* 07/26/2013 1003   CREATININE 5.00* 07/26/2013 1003   CALCIUM 10.5 07/26/2013 0930   GFRNONAA 10* 07/26/2013 0930   GFRAA 12* 07/26/2013 0930    CMP     Component Value Date/Time   NA 157* 07/26/2013 1003   K 2.4* 07/26/2013 1003   CL 118* 07/26/2013 1003   CO2 17* 07/26/2013 0930   GLUCOSE 177* 07/26/2013 1003   BUN 97* 07/26/2013 1003   CREATININE 5.00* 07/26/2013 1003   CALCIUM 10.5 07/26/2013 0930   PROT 6.9 07/26/2013 0930   ALBUMIN 2.4* 07/26/2013 0930   AST 44* 07/26/2013 0930   ALT 66* 07/26/2013 0930  ALKPHOS 52 07/26/2013 0930   BILITOT 0.6 07/26/2013 0930   GFRNONAA 10* 07/26/2013 0930   GFRAA 12* 07/26/2013 0930    Assessment and Plan: 1. Code Status: DNR 2. Symptom Control:  1. Anxiety/Agitation: Ativan prn.  2. Pain: Morphine infusion. Bolus via infusion prn.  3. Bowel Regimen: Dulcolax supp prn.  4. Fever: Acetaminophen prn.  5. Nausea/Vomiting: Ondansetron prn.  6. Terminal Secretions: Scopolamine patch. Atropine SL prn.  3. Psycho/Social: Emotional support provided to patient and family. 4. Spiritual: Support from their personal church. 5. Disposition: GIP vs hospice facility.   Time In Time Out Total Time Spent with Patient Total Overall Time  0830 0900 33min 30min    Greater than 50%  of this time was spent  counseling and coordinating care related to the above assessment and plan.  Vinie Sill, NP Palliative Medicine Team Pager # [phone] Team Phone # [phone]   1

## 2013-07-27 NOTE — Progress Notes (Signed)
   CARE MANAGEMENT NOTE 07/27/2013  Patient:  ORVEL, CUTSFORTH   Account Number:  [id number]  Date Initiated:  07/27/2013  Documentation initiated by:  Lizabeth Leyden  Subjective/Objective Assessment:   admitted from [facility] with AMS, ? perforated bowel  comfort measures     Action/Plan:   Palliative consult: GIP vs Hospice   Anticipated DC Date:  2013/08/07   Anticipated DC Plan:  [place]  In-house referral  Clinical Social Worker      DC Planning Services  CM consult      Choice offered to / List presented to:             Status of service:  In process, will continue to follow Medicare Important Message given?   (If response is "NO", the following Medicare IM given date fields will be blank) Date Medicare IM given:   Date Additional Medicare IM given:    Discharge Disposition:    Per UR Regulation:  Reviewed for med. necessity/level of care/duration of stay  If discussed at [place] of Stay Meetings, dates discussed:    Comments:  08-07-13  [place], [place]  [phone] CM referral: GIP choice  Call to daughter Arbutus Leas [phone] regarding choice for GIP.  She selected [facility], NCM reviewed the process with referral.  Dr Tat will refer to Palliative Team as attending.  [facility]/ [facility] notified via text of referral.

## 2013-07-28 ENCOUNTER — Inpatient Hospital Stay (HOSPITAL_COMMUNITY)
Admission: AD | Admit: 2013-07-28 | Discharge: 2013-08-07 | DRG: 871 | Disposition: E | Source: Ambulatory Visit | Attending: Internal Medicine | Admitting: Internal Medicine

## 2013-07-28 DIAGNOSIS — G9340 Encephalopathy, unspecified: Secondary | ICD-10-CM | POA: Diagnosis present

## 2013-07-28 DIAGNOSIS — Z66 Do not resuscitate: Secondary | ICD-10-CM | POA: Diagnosis present

## 2013-07-28 DIAGNOSIS — J189 Pneumonia, unspecified organism: Secondary | ICD-10-CM | POA: Diagnosis present

## 2013-07-28 DIAGNOSIS — E785 Hyperlipidemia, unspecified: Secondary | ICD-10-CM | POA: Diagnosis present

## 2013-07-28 DIAGNOSIS — N183 Chronic kidney disease, stage 3 unspecified: Secondary | ICD-10-CM | POA: Diagnosis present

## 2013-07-28 DIAGNOSIS — R0600 Dyspnea, unspecified: Secondary | ICD-10-CM

## 2013-07-28 DIAGNOSIS — F0390 Unspecified dementia without behavioral disturbance: Secondary | ICD-10-CM | POA: Diagnosis present

## 2013-07-28 DIAGNOSIS — Z8673 Personal history of transient ischemic attack (TIA), and cerebral infarction without residual deficits: Secondary | ICD-10-CM

## 2013-07-28 DIAGNOSIS — E119 Type 2 diabetes mellitus without complications: Secondary | ICD-10-CM | POA: Diagnosis present

## 2013-07-28 DIAGNOSIS — K631 Perforation of intestine (nontraumatic): Secondary | ICD-10-CM | POA: Diagnosis present

## 2013-07-28 DIAGNOSIS — R0609 Other forms of dyspnea: Secondary | ICD-10-CM

## 2013-07-28 DIAGNOSIS — K668 Other specified disorders of peritoneum: Secondary | ICD-10-CM | POA: Diagnosis present

## 2013-07-28 DIAGNOSIS — R0989 Other specified symptoms and signs involving the circulatory and respiratory systems: Secondary | ICD-10-CM

## 2013-07-28 DIAGNOSIS — G40909 Epilepsy, unspecified, not intractable, without status epilepticus: Secondary | ICD-10-CM | POA: Diagnosis present

## 2013-07-28 DIAGNOSIS — A419 Sepsis, unspecified organism: Principal | ICD-10-CM | POA: Diagnosis present

## 2013-07-28 DIAGNOSIS — I129 Hypertensive chronic kidney disease with stage 1 through stage 4 chronic kidney disease, or unspecified chronic kidney disease: Secondary | ICD-10-CM | POA: Diagnosis present

## 2013-07-28 DIAGNOSIS — Z515 Encounter for palliative care: Secondary | ICD-10-CM

## 2013-07-28 DIAGNOSIS — R6520 Severe sepsis without septic shock: Secondary | ICD-10-CM

## 2013-07-28 MED ORDER — ONDANSETRON HCL 4 MG PO TABS: 4.0000 mg | ORAL_TABLET | Freq: Four times a day (QID) | ORAL | Status: DC | PRN

## 2013-07-28 MED ORDER — SCOPOLAMINE 1 MG/3DAYS TD PT72
1.0000 | MEDICATED_PATCH | TRANSDERMAL | Status: DC
  Administered 2013-07-28: 1.5 mg via TRANSDERMAL
  Filled 2013-07-28: qty 1

## 2013-07-28 MED ORDER — ONDANSETRON HCL 4 MG/2ML IJ SOLN: 4.0000 mg | Freq: Four times a day (QID) | INTRAMUSCULAR | Status: DC | PRN

## 2013-07-28 MED ORDER — ACETAMINOPHEN 650 MG RE SUPP: 650.0000 mg | Freq: Four times a day (QID) | RECTAL | Status: DC | PRN

## 2013-07-28 MED ORDER — MORPHINE BOLUS VIA INFUSION
1.0000 mg | INTRAVENOUS | Status: DC | PRN
  Filled 2013-07-28: qty 1

## 2013-07-28 MED ORDER — DEXTROSE 5 % IV SOLN
2.0000 mg/h | INTRAVENOUS | Status: DC
  Filled 2013-07-28 (×2): qty 10

## 2013-07-28 MED ORDER — ACETAMINOPHEN 325 MG PO TABS: 650.0000 mg | ORAL_TABLET | Freq: Four times a day (QID) | ORAL | Status: DC | PRN

## 2013-07-28 MED ORDER — SODIUM CHLORIDE 0.9 % IV SOLN: INTRAVENOUS | Status: DC

## 2013-07-28 MED ORDER — ATROPINE SULFATE 1 % OP SOLN
4.0000 [drp] | OPHTHALMIC | Status: DC | PRN
  Filled 2013-07-28: qty 2

## 2013-07-28 MED ORDER — LORAZEPAM 2 MG/ML IJ SOLN
1.0000 mg | INTRAMUSCULAR | Status: DC
  Administered 2013-07-28 (×2): 1 mg via INTRAVENOUS
  Filled 2013-07-28 (×2): qty 1

## 2013-08-01 LAB — CULTURE, BLOOD (ROUTINE X 2)
CULTURE: NO GROWTH
CULTURE: NO GROWTH

## 2013-08-07 NOTE — Discharge Summary (Addendum)
Death Summary  Kurt [name] ZMO:[id number] DOB: [birth year] DOA: 08/23/13  PCP: [name], MD PCP/Office notified:  Will be in the am  Admit date: 2013-08-23 Date of Death: 23-Aug-2013  Final Diagnoses:  Active Problems:   Perforation of intestine Sepsis Pneumonia Dyspnea Agitation   History of present illness: 80 yr old African [place] male admitted with perforated viscus.  Family elected comfort care.   Hospital Course: Patient was transitioned to comfort care and admited to [facility] hospice care. He required continuous opiates to assist with pain and dyspnea.  The patient was found by  Nursing at 752 pm without pulse or respirations.  His family was contacted.   Time: 752 pm  Signed:  Dejanee Thibeaux L. Lovena Le, MD MBA The Palliative Medicine Team at [facility] Phone: [phone] Pager: [phone]

## 2013-08-07 NOTE — Progress Notes (Signed)
[facility] Work note: Met with patient's daughter/general POA Arbutus Leas to explain hospice benefit and support services, confirm interest and complete [facility] admission paper work. Forms delivered to [facility] in admitting at 10:25. Dr. Carles Collet, Dr. Lovena Le, [facility], [place] and unit secretary aware. Requested comfort cart be refreshed at request of daughter Pamala Hurry.   Family experiencing multiple stressors with Mrs. Stankus also being hospitalized and years of coordinating care. Pamala Hurry expressed being at peace knowing they are honoring his wishes not to be hooked to machines and no suffering. They report good support/daily visits  from church family. Offer for hospital or [facility] chaplain declined. Funeral arrangements in place with [facility]. [facility] RN and SW continue to follow with Dr. Lovena Le as attending. Please do not hesitate to contact me for support to this family. Thank you. Erling Conte LCSW [phone]

## 2013-08-07 NOTE — Discharge Summary (Signed)
Physician Discharge Summary  Kurt [name] [id number] DOB: [birth year] DOA: 07/26/2013  PCP: [name], MD  Admit date: 07/26/2013 Discharge date: 2013/08/17  Patient is being Transferred to GIP/Hospice Care.  Discharge Diagnoses:  Principal Problem:   Pneumoperitoneum Active Problems:   Hypertension   Diabetes mellitus without complication   Hyperlipidemia   CKD (chronic kidney disease), stage III   ARF (acute renal failure)   Dehydration   Perforated viscus   Palliative care encounter   Sepsis   Aspiration pneumonia   Acute renal failure   Perforated bowel Sepsis  -Secondary to pneumoperitoneum and pneumonia  -No longer an active issue as the patient's focus of care has been changed to comfort focused  Pneumoperitoneum/peritonitis  -Likely due to perforated viscus  -No longer an active issue as the patient's focus of care has been changed to comfort focused  Pneumonia  -Likely due to aspiration  -No longer an active issue as the patient's focus of care has been changed to comfort focused  Acute encephalopathy  -Due to sepsis and renal failure  Acute on chronic renal failure(CKD stage III)  -Due to infectious process and volume depletion/dehydration  -No longer an active issue as the patient's focus of care has been changed to comfort focused  Acute respiratory failure  -Secondary to pneumonia  GOC  -Extensive family discussion with patient's family at bedside ( two sisters who are both power of attorney), as well as EDP, General Surgery team and PCCM.  -Family decided against surgery  -Requested palliative medicine consultation  -After meeting with palliative medicine, the patient was transitioned to comfort care focused care   Discharge Condition: stable  Disposition: GIP  Diet: pleasure feeds if able Wt Readings from Last 3 Encounters:  07/16/13 89.812 kg (198 lb)  07/16/13 89.812 kg (198 lb)  02/22/13 90.1 kg (198 lb 10.2 oz)    History of present  illness:  80 y.o. male with PMH of HTN, HPL, CKD, DM, dementia, seizures who recently discharged to SNF post hip fracture surgery--L hip bipolar arthroplasty.by [name] on 07/17/13.  EMS brought pt with shortness of breath, altered mental status, cough congestion. Patient has a history of dementia but is normally able to care and a conversation. On the morning of admission he had difficulty breathing and was increasingly confused and unable to talk.  In the ED, the patient was noted to be hypoxemic and encephalopathic. Workup revealed a pneumoperitoneum as well as pneumonia concerning for aspiration. The patient was also noted to be in acute renal failure. Critical care and general surgery were consulted. A discussion was undertaken with the family, and per the family's request, the patient was made DO NOT RESUSCITATE and transitioned to comfort care. Palliative medicine was consulted the clip articles of care and with symptom management. All nonessential medications were discontinued. They agreed for transition of the patient to hospice care. Dr. Billey Chang will assume care of the patient. The patient will continue her morphine drip, scheduled Ativan, and scopolamine.      Consultants: PCCM General Surgery Palliative Medicine  Discharge Exam: Filed Vitals:   August 17, 2013 0532  BP: 95/49  Pulse: 86  Temp: 99.3 F (37.4 C)  Resp: 11   Filed Vitals:   07/26/13 1145 07/26/13 1518 07/27/13 0843 08/17/13 0532  BP: 136/53 105/51 119/37 95/49  Pulse: 76 65 58 86  Temp:  98.5 F (36.9 C) 98.1 F (36.7 C) 99.3 F (37.4 C)  TempSrc:  Oral Oral Axillary  Resp: 22  24 22 11   SpO2: 96% 98% 92% 92%   General: Patient resting comfortably without any distress Cardiovascular: RRR, no rub, no gallop, no S3 Respiratory: Bibasilar crackles no wheeze, no rhonchi Abdomen:soft,  nondistended, positive bowel sounds Extremities: No edema, No lymphangitis, no petechiae  Discharge Instructions      Medication List    STOP taking these medications       amLODipine 2.5 MG tablet  Commonly known as:  NORVASC     aspirin 325 MG EC tablet     atenolol 50 MG tablet  Commonly known as:  TENORMIN     bisacodyl 10 MG suppository  Commonly known as:  DULCOLAX     cholecalciferol 400 UNITS Tabs tablet  Commonly known as:  VITAMIN D     divalproex 125 MG capsule  Commonly known as:  DEPAKOTE SPRINKLE     ferrous sulfate 325 (65 FE) MG tablet     finasteride 5 MG tablet  Commonly known as:  PROSCAR     HYDROcodone-acetaminophen 5-325 MG per tablet  Commonly known as:  NORCO/VICODIN     levETIRAcetam 500 MG tablet  Commonly known as:  KEPPRA     LORazepam 1 MG tablet  Commonly known as:  ATIVAN     multivitamin with minerals Tabs tablet     NAMENDA XR 14 MG Cp24  Generic drug:  Memantine HCl ER     NAMENDA XR 28 MG Cp24  Generic drug:  Memantine HCl ER     pantoprazole 40 MG tablet  Commonly known as:  PROTONIX     polyethylene glycol packet  Commonly known as:  MIRALAX / GLYCOLAX     simvastatin 20 MG tablet  Commonly known as:  ZOCOR     TUBERCULIN PPD ID       MEDICATIONS TO CONTINUE Morphine IV continuous infusion--2 mg per hour Scopolamine 1.5 mg transdermal patch q. 72 hours Atropine ophthalmic solution, 4 drops sublingual when necessary secretions Ativan 1 mg every 4 hours IV Zofran 4 mg every 6 hours when necessary nausea vomiting Tylenol suppository 650 mg every 4 hours when necessary fever  The results of significant diagnostics from this hospitalization (including imaging, microbiology, ancillary and laboratory) are listed below for reference.    Significant Diagnostic Studies: Dg Chest 1 View  07/18/2013   CLINICAL DATA:  Status post left hip arthroplasty for fracture.  EXAM: CHEST - 1 VIEW  COMPARISON:  DG RIBS UNILATERAL W/CHEST*R* dated 05/02/2013; DG CHEST 1V PORT dated 02/22/2013; DG KNEE1- 2 VIEWS*L* dated 07/16/2013; DG HIP COMPLETE*L*  dated 07/16/2013  FINDINGS: The heart size is normal. The aorta is ectatic. There is no evidence of pulmonary edema, consolidation, pneumothorax, nodule or pleural fluid.  IMPRESSION: No active disease.   Electronically Signed   By: Aletta Edouard M.D.   On: 07/18/2013 16:08   Dg Hip Complete Left  07/16/2013   CLINICAL DATA:  Left hip pain after fall.  EXAM: LEFT HIP - COMPLETE 2+ VIEW  COMPARISON:  None available for comparison at time of study interpretation.  FINDINGS: Left subcapital femur fracture with varus angulation distal bony fragments, impaction. Femoral head is well formed and located. No dislocation.  Bone mineral density is decreased without destructive bony lesions. Periarticular soft tissue planes are nonsuspicious.  IMPRESSION: Angulated impacted left subcapital femur fracture without dislocation.   Electronically Signed   By: Elon Alas   On: 07/16/2013 22:36   Dg Knee 1-2 Views Left  07/16/2013  CLINICAL DATA:  Fall with left knee pain.  EXAM: LEFT KNEE - 1-2 VIEW  COMPARISON:  None.  FINDINGS: Oblique images were not obtained due to limited patient mobility because of a hip fracture.  Bones are osteopenic. There is no evidence of acute fracture or dislocation. No joint effusion is seen. Patellar enthesophytes are present. Medial and lateral compartment joint spaces are maintained. Vascular calcifications are present.  IMPRESSION: No evidence of acute osseous abnormality involving the left knee.   Electronically Signed   By: Logan Bores   On: 07/16/2013 22:38   US Renal  07/19/2013   CLINICAL DATA:  Worsening creatinine.  EXAM: RENAL/URINARY TRACT ULTRASOUND COMPLETE  COMPARISON:  09/27/2012  FINDINGS: Right Kidney:  Length: 13.5 cm. There multiple cysts. Normal parenchymal echogenicity. No solid renal masses. Largest cyst arises from the lower pole measuring 9.2 cm in greatest dimension. 4 cm cyst arises from the upper pole. No hydronephrosis.  Left Kidney:  Length: 11.9 cm.  Normal echogenicity. Multiple cysts. There are 2 upper pole cysts, the larger measuring 5 cm with the smaller cyst just below this measuring 3.9 cm no solid renal masses, no stones and no hydronephrosis.  Bladder:  Decompressed by a Foley catheter  Incidental note is made of gallstones.  IMPRESSION: 1. Normal renal sizes and overall parenchymal echogenicity. 2. No hydronephrosis. 3. Bilateral renal cysts, essentially stable.   Electronically Signed   By: Lajean Manes M.D.   On: 07/19/2013 15:25   Dg Pelvis Portable  07/17/2013   CLINICAL DATA:  Surgery for a left femoral neck fracture.  EXAM: PORTABLE PELVIS 1-2 VIEWS  COMPARISON:  None.  FINDINGS: Left hip prosthesis in satisfactory position and alignment. No fracture or dislocation seen on a single frontal view. Diffuse osteopenia.  IMPRESSION: Satisfactory postoperative appearance of a left hip prosthesis.   Electronically Signed   By: Enrique Sack M.D.   On: 07/17/2013 13:30   Dg Chest Portable 1 View  07/26/2013   CLINICAL DATA:  Shortness of Breath  EXAM: PORTABLE CHEST - 1 VIEW  COMPARISON:  July 18, 2013  FINDINGS: There is extensive pneumoperitoneum. There is consolidation in the left lower lobe. Lungs elsewhere are clear. Heart size and pulmonary vascularity are normal. Aorta is prominent but stable. There is no appreciable adenopathy.  IMPRESSION: Extensive pneumoperitoneum.  Left lower lobe airspace consolidation.  Critical Value/emergent results were called by telephone at the time of interpretation on 07/26/2013 at 9:42 AM to Dr. Ezequiel Essex , who verbally acknowledged these results.   Electronically Signed   By: Lowella Grip M.D.   On: 07/26/2013 09:42     Microbiology: Recent Results (from the past 240 hour(s))  URINE CULTURE     Status: None   Collection Time    07/18/13  3:52 PM      Result Value Ref Range Status   Specimen Description URINE, RANDOM   Final   Special Requests NONE   Final   Culture  Setup Time     Final    Value: 07/18/2013 21:01     Performed at [place]     Final   Value: NO GROWTH     Performed at [facility]   Culture     Final   Value: NO GROWTH     Performed at [facility]   Report Status 07/19/2013 FINAL   Final  CLOSTRIDIUM DIFFICILE BY PCR     Status: None  Collection Time    07/19/13 10:51 PM      Result Value Ref Range Status   C difficile by pcr NEGATIVE  NEGATIVE Final   Comment: Performed at [place], BLOOD (ROUTINE X 2)     Status: None   Collection Time    07/26/13  9:30 AM      Result Value Ref Range Status   Specimen Description BLOOD RIGHT FOREARM   Final   Special Requests BOTTLES DRAWN AEROBIC AND ANAEROBIC 10CC   Final   Culture  Setup Time     Final   Value: 07/26/2013 14:00     Performed at [facility]   Culture     Final   Value:        BLOOD CULTURE RECEIVED NO GROWTH TO DATE CULTURE WILL BE HELD FOR 5 DAYS BEFORE ISSUING A FINAL NEGATIVE REPORT     Performed at [facility]   Report Status PENDING   Incomplete  CULTURE, BLOOD (ROUTINE X 2)     Status: None   Collection Time    07/26/13  9:45 AM      Result Value Ref Range Status   Specimen Description BLOOD HAND RIGHT   Final   Special Requests BOTTLES DRAWN AEROBIC AND ANAEROBIC 8CC   Final   Culture  Setup Time     Final   Value: 07/26/2013 14:01     Performed at [facility]   Culture     Final   Value:        BLOOD CULTURE RECEIVED NO GROWTH TO DATE CULTURE WILL BE HELD FOR 5 DAYS BEFORE ISSUING A FINAL NEGATIVE REPORT     Performed at [facility]   Report Status PENDING   Incomplete  MRSA PCR SCREENING     Status: Abnormal   Collection Time    07/26/13  4:47 PM      Result Value Ref Range Status   MRSA by PCR POSITIVE (*) NEGATIVE Final   Comment:            The GeneXpert MRSA Assay (FDA     approved for NASAL specimens     only), is one component of a     comprehensive MRSA  colonization     surveillance program. It is not     intended to diagnose MRSA     infection nor to guide or     monitor treatment for     MRSA infections.     RESULT CALLED TO, READ BACK BY AND VERIFIED WITH:     T.Golding,RN AT 2255 BY L.PITT 07/26/13     Labs: Basic Metabolic Panel:  Recent Labs Lab 07/26/13 0930 07/26/13 1003  NA 156* 157*  K 2.7* 2.4*  CL 117* 118*  CO2 17*  --   GLUCOSE 179* 177*  BUN 93* 97*  CREATININE 4.79* 5.00*  CALCIUM 10.5  --    Liver Function Tests:  Recent Labs Lab 07/26/13 0930  AST 44*  ALT 66*  ALKPHOS 52  BILITOT 0.6  PROT 6.9  ALBUMIN 2.4*   No results found for this basename: LIPASE, AMYLASE,  in the last 168 hours No results found for this basename: AMMONIA,  in the last 168 hours CBC:  Recent Labs Lab 07/26/13 0930 07/26/13 1003  WBC 32.0*  --   NEUTROABS 28.8*  --   HGB 8.7* 9.5*  HCT 26.2* 28.0*  MCV 95.3  --  PLT 336  --    Cardiac Enzymes:  Recent Labs Lab 07/26/13 0930  TROPONINI <0.30   BNP: No components found with this basename: POCBNP,  CBG:  Recent Labs Lab 07/26/13 0943  GLUCAP 178*    Time coordinating discharge:  Greater than 30 minutes  Signed:  Daphene Chisholm, Deandrew, DO [facility] Pager: [phone] 08/02/13, 9:59 AM

## 2013-08-07 NOTE — Progress Notes (Signed)
GIP Status [facility] Rm 6E03 D [facility] and Palliative Care of [facility] RN Visit- M. Wynetta Emery, RN  Pt admitted to [facility] and Palliative Care of [facility] GIP Status level of care. HQ:[id number] of the intestine (569.83); met briefly with daughters yesterday at request of CMRN, to discuss GIP option and plan was to move forward today -daughters shared the struggle patient has had over the last month and voiced this sudden decline has been stressful for the whole family; Pamala Hurry shared they are pleased with the adjustments made to medications to help with his pain and agitation. Patient seen again today early evening; chart reviewed and spoke with Gwenlyn Perking Staff RN; pt unresponsive to voice, touch,continues on NRB Mask RR=12-14; noted intermittent 5-10 second pauses, appears comfortable- no family at bedside. [facility] team continue to follow with Dr Lovena Le, attending; Please contact [place] @ [phone] with any hospice needs    Danton Sewer, RN Hospice [phone]

## 2013-08-07 NOTE — Progress Notes (Signed)
Morphine bag with 25ml. Witnessed by J. Elisha Ponder RN and R. Lacy Duverney. Velora Mediate

## 2013-08-07 NOTE — Progress Notes (Signed)
I have reviewed this case with our NP and agree with the Assessment and Plan as stated.  Arayah Krouse L. Araminta Zorn, MD MBA The Palliative Medicine Team at [place] Team Phone: [phone] Pager: [phone]   

## 2013-08-07 NOTE — H&P (Signed)
History and Physical  ERRICK [name] HUD:[id number] DOB: 14-Oct-1928 DOA: 2013/08/01  Referring physician: Dr. Carles Collet PCP: [name], MD   Chief Complaint: Altered mental status   History of Present Illness: Kurt [name] is an 80 y.o. male admitted with altered mental status.  Patient has multiple medical problems including HTN,CKD, dementia, and seizure disorder.  He recently had hip fracture repair.  He was found to have pneumoperitoneum likely from a perforated bowel.  Family elected full comfort care and they elected to transition to hospice care.  Review of Systems: Unable to be obtained due to altered mental status.  Past Medical History Past Medical History  Diagnosis Date  . Hypertension   . Hyperlipidemia   . Seizure disorder   . CKD (chronic kidney disease), stage III   . Altered mental state 08/31/2012    "came into hospital a little disoriented; it's gotten worse; this is not normal" (08/31/2012)  . Type II diabetes mellitus   . Chronic lower back pain   . Frequent falls   . brain tumor dx'd 2002    surg only; refused xrt last yr per family member  . DDD (degenerative disc disease), lumbar   . Dementia   . Seizure disorder     with left leg weakness (Todd's phenomenon)  . Dysphagia   . Assistance needed for mobility     walks with walker  . Sialoadenitis   . TIA (transient ischemic attack)      Past Surgical History Past Surgical History  Procedure Laterality Date  . Brain tumor excision    . 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 Hip arthroplasty Left 07/17/2013    Procedure: ARTHROPLASTY BIPOLAR HIP;  Surgeon: Tobi Bastos, MD;  Location: [facility];  Service: Orthopedics;  Laterality: Left;     Social History: History   Social History  . Marital Status: Married    Spouse Name: N/A    Number of Children: 79  Three daughters , one son  . Years of Education: N/A   Occupational History  . Worked for [employer] History Main Topics   . Smoking status: Never Smoker   . Smokeless tobacco: Never Used  . Alcohol Use: No  . Drug Use: No  . Sexual Activity: Not on file   Other Topics Concern  . Not on file   Social History Narrative  . No narrative on file    Family History:  Family History  Problem Relation Age of Onset  . Hypertension Daughter     Allergies: Review of patient's allergies indicates no known allergies.  Meds: Prior to Admission medications   Not on File    Physical Exam: Filed Vitals:   08-01-13 1952  Weight: 89.812 kg (198 lb)     Physical Exam: 99.3 98/49 p 86 R 11 94 % Weight 89.812 kg (198 lb). Gen: No acute distress, shallow, slow breathing Head: temporal wasting Eyes: Pupils not examined, mm dry Chest: Lungs decreased shallow respirations CV: Heart sounds  Distant , S1, S2 Abdomen: disteneded quiet bowel sounds Extremities: Extremities still, trace none pitting edema Skin: Warm and dry,  No mottling Neuro: Sedated, not responsive to tactile or verbal stimuli   Labs on Admission:  Basic Metabolic Panel:  Recent Labs Lab 07/26/13 0930 07/26/13 1003  NA 156* 157*  K 2.7* 2.4*  CL 117* 118*  CO2 17*  --   GLUCOSE 179* 177*  BUN 93* 97*  CREATININE 4.79* 5.00*  CALCIUM 10.5  --    Liver Function Tests:  Recent Labs Lab 07/26/13 0930  AST 44*  ALT 66*  ALKPHOS 52  BILITOT 0.6  PROT 6.9  ALBUMIN 2.4*     CBC:  Recent Labs Lab 07/26/13 0930 07/26/13 1003  WBC 32.0*  --   NEUTROABS 28.8*  --   HGB 8.7* 9.5*  HCT 26.2* 28.0*  MCV 95.3  --   PLT 336  --    Cardiac Enzymes:  Recent Labs Lab 07/26/13 0930  TROPONINI <0.30    BNP (last 3 results)  Recent Labs  07/26/13 0930  PROBNP 3707.0*   CBG:  Recent Labs Lab 07/26/13 0943  GLUCAP 178*    Radiological Exams on Admission: Extensive pneumoperitoneum.  Left lower lobe airspace consolidation.     Assessment/Plan Active Problems:   Perforation of intestine  Comfort care  titrate morphine  Agitation: scheduled ativan Dyspnea: titrate morphine DVT prophylaxis:  None. Comfort care  Code Status: DNR Family Communication:  Spoke with his three daughters at the bedside Disposition Plan:  Expecting a hospital death  Time spent: 1105-1130 pm  Tallula Grindle L. Lovena Le, MD MBA The Palliative Medicine Team at [facility] Phone: [phone] Pager: [phone]

## 2013-08-07 NOTE — Progress Notes (Signed)
Pt passed away at 1952, not witnessed. Absent breath sounds, pulseless. Death verified by J. Elisha Ponder RN and R. Lacy Duverney. Geni Bers MD notified, [name] (daughter) notified. Velora Mediate

## 2013-08-07 NOTE — Progress Notes (Signed)
Mr. Innis appears more comfortable today with Morphine infusion and scheduled Ativan. I check with his family and they agree. They are very supportive of him and each other while we make him comfortable. They understand that he could pass at any time. They ask about changing his hip dressing which has some oozing and I asked nursing who is following up by obtaining a clean dressing from orthopedic unit to redress his incision. We will continue to support this patient and his family holistically.  Vinie Sill, NP Palliative Medicine Team Pager # [phone] Team Phone # [phone]

## 2013-08-07 NOTE — H&P (Signed)
Patient Kurt [name]      DOB: [birth year]      KKD:[id number]  Patient seen and examined.  Accept to GIP status with Hospice and [place].  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  He loved children and his family.  Exam reveals : shallow slow breaths , not in distress.  Full note to follow. Emotional support offered.     Discussed with Dr. Carles Collet  Total Time: 1834 am to 1130 am   Ziya Coonrod L. Lovena Le, MD MBA The Palliative Medicine Team at [facility] Phone: [phone] Pager: [phone]

## 2013-08-07 DEATH — deceased

## 2013-08-23 NOTE — Consult Note (Signed)
I have reviewed and discussed the care of this patient in detail with the nurse practitioner including pertinent patient records, physical exam findings and data. I agree with details of this encounter.  

## 2014-08-08 DIAGNOSIS — R269 Unspecified abnormalities of gait and mobility: Secondary | ICD-10-CM | POA: Insufficient documentation

## 2014-11-06 IMAGING — CT CT HEAD W/O CM
1 of 2 series · 13 of 30 positions shown, 17 images · non-contrast
Comparison: Head CT from 05/06/2009.  Brain MRI from 05/11/2009.

CLINICAL DATA: Left-sided weakness.  History of brain tumor.

CT HEAD WITHOUT CONTRAST
TECHNIQUE: Contiguous axial images were obtained from the base of
the skull through the vertex without contrast.

[Series 2: brain · axial · 0.47mm/px · z∈[+176,+303]mm · 13 of 28 slices shown, 17 images]
[im 2/28  brain]
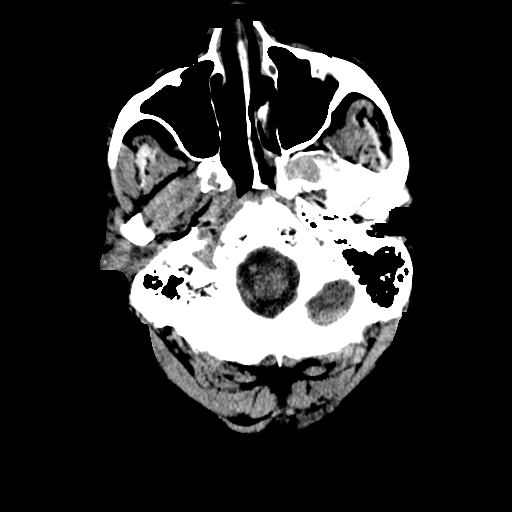
[im 2/28  bone]
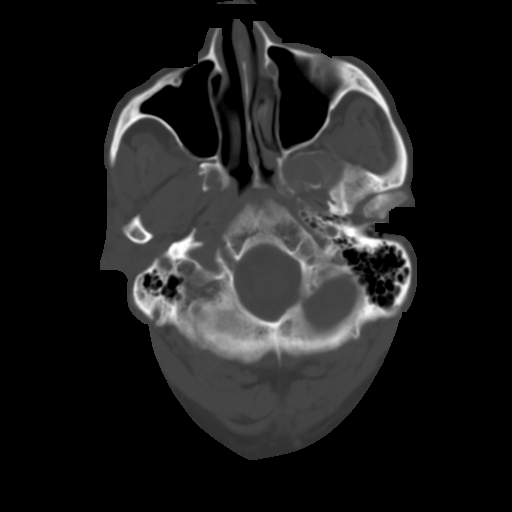
[im 4/28  brain]
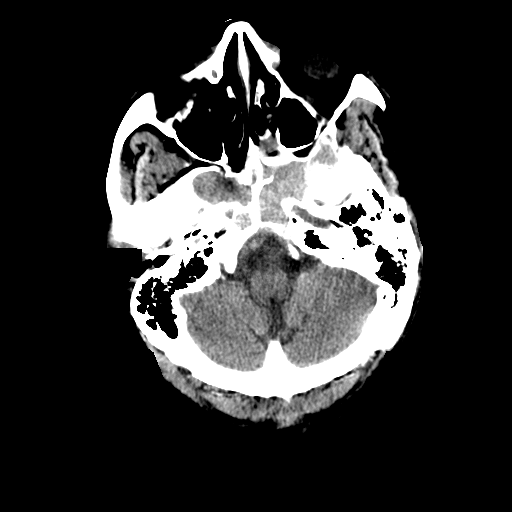
[im 6/28  brain]
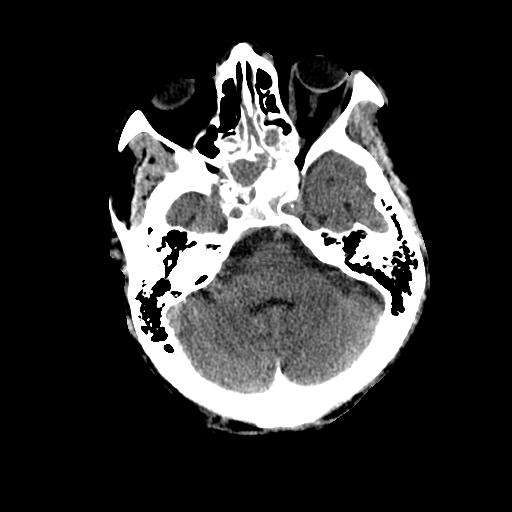
[im 8/28  brain]
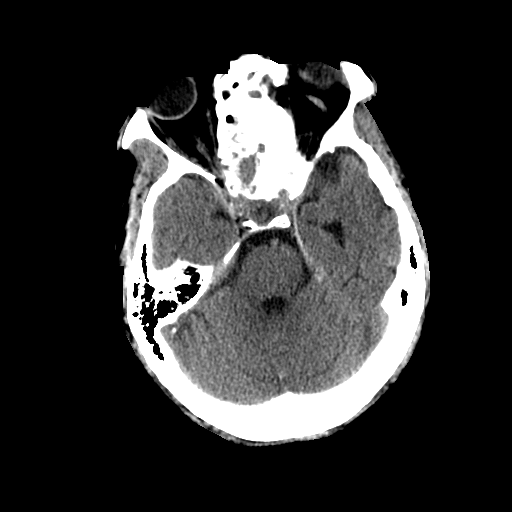
[im 10/28  brain]
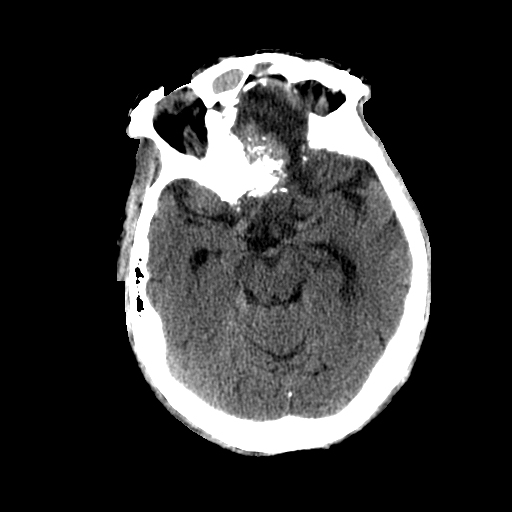
[im 10/28  bone]
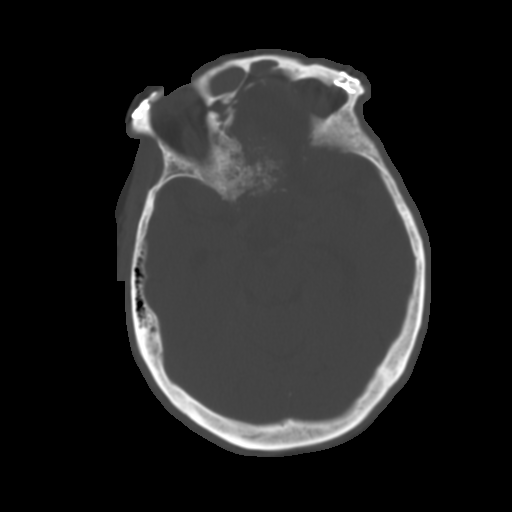
[im 12/28  brain]
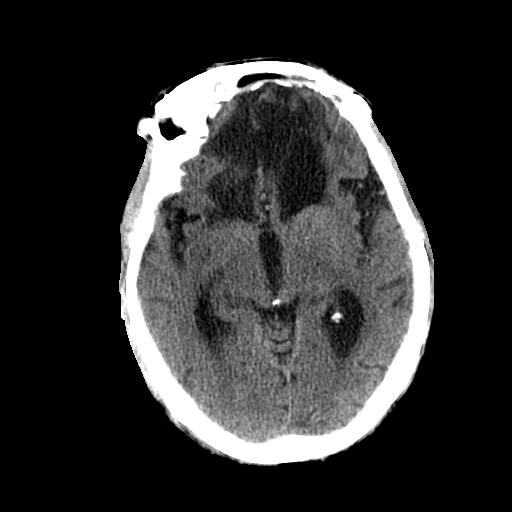
[im 14/28  brain]
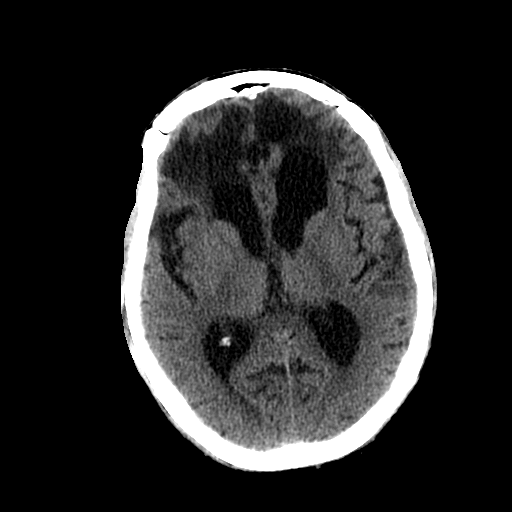
[im 16/28  brain]
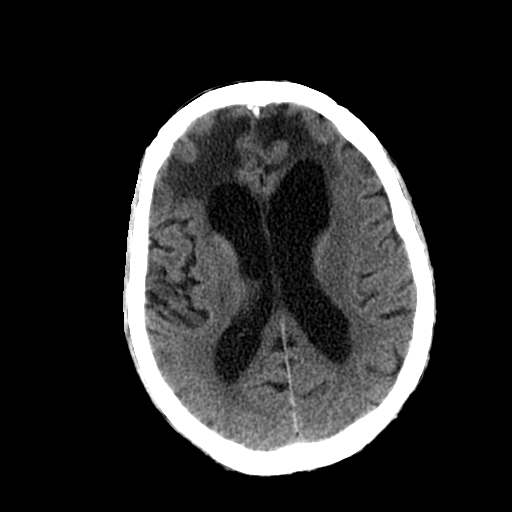
[im 18/28  brain]
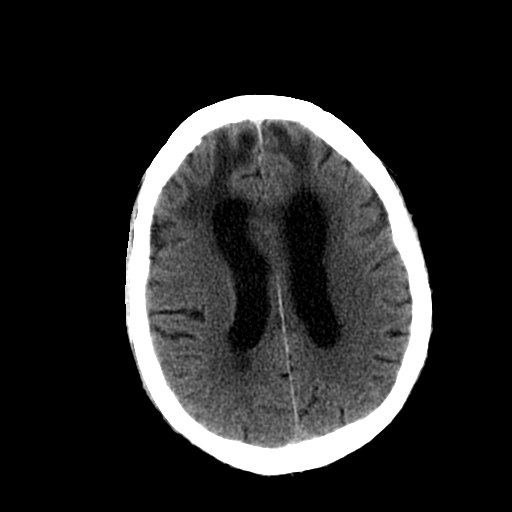
[im 18/28  bone]
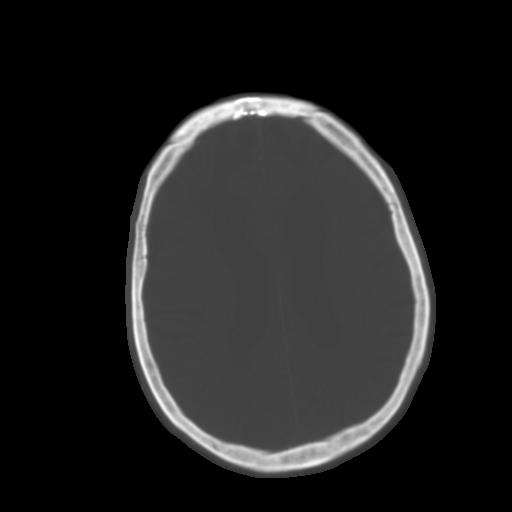
[im 20/28  brain]
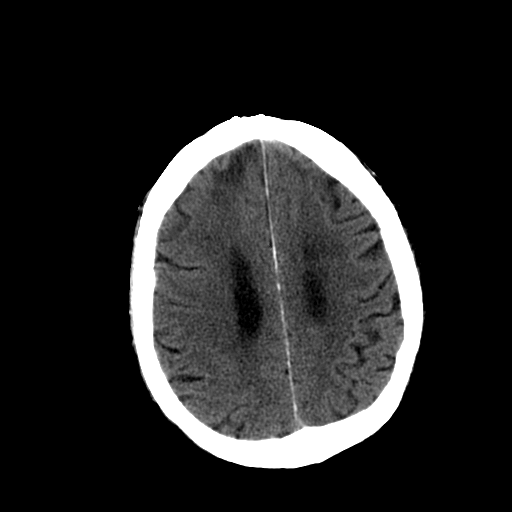
[im 22/28  brain]
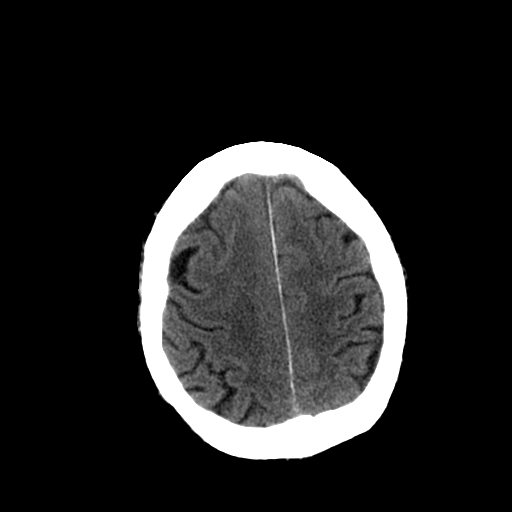
[im 24/28  brain]
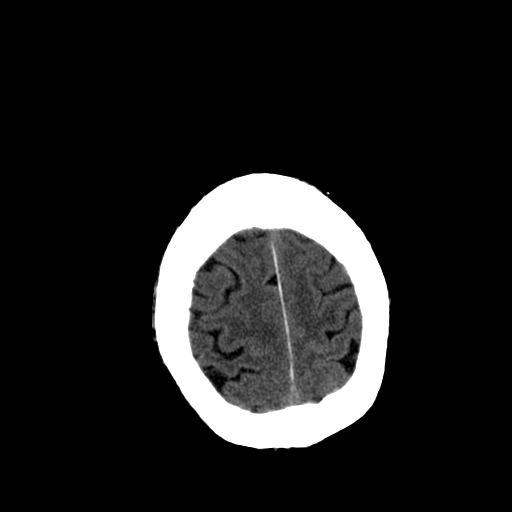
[im 26/28  brain]
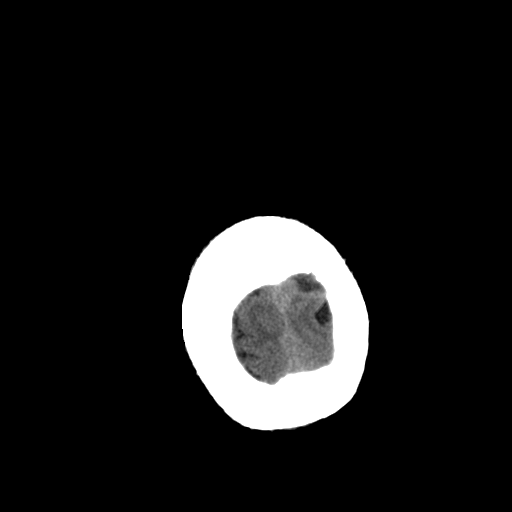
[im 26/28  bone]
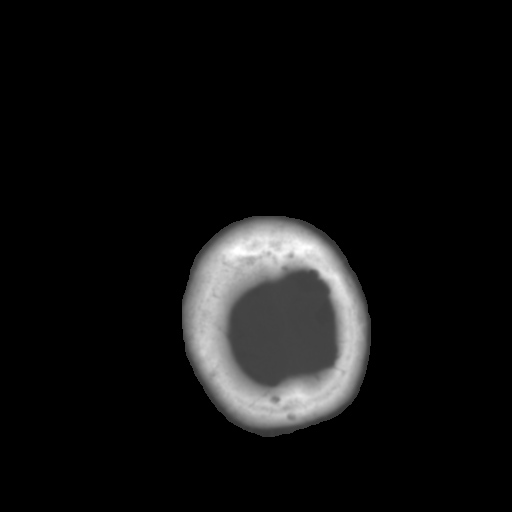

[13 of 30 positions shown; findings below may reference images not displayed]

FINDINGS: There is no evidence for an acute hemorrhage.  No
hydrocephalus.  The bifrontal encephalomalacia with ex vacuo
dilatation of both frontal horns is stable.

There is evidence of prior frontal craniotomy. There is progression
of tumor along the superior cribiform plate, extending into the
floor of the frontal skull.

The sphenoid sinuses are opacified.  On this CT scan from
05/06/2009, the right sphenoid sinus was opacified on today's
study, both sphenoid sinuses are completely opacified and there is
extension of the material in the posterior ethmoid air cells.  MRI
from 05/11/2009 indicated that this represented residual tumor
which has apparently progressed in the interval.  There is no
interval substantial enlargement of the sphenoid sinuses and no
gross destruction of the sphenoid or posterior ethmoid sinuses is
evident.
IMPRESSION: Interval progression of tumor in the inferior frontal region with
further expansion into the sphenoid sinuses and posterior ethmoid
air cells.

Extensive postoperative encephalomalacia in the frontal lobes with
ex vacuo dilatation of the frontal horns.  This is not
substantially changed in the interval.

## 2015-10-02 IMAGING — CR DG CHEST 1V PORT
1 series · 1 of 1 positions shown · non-contrast
Comparison: July 18, 2013

CLINICAL DATA: Shortness of Breath

EXAM:
PORTABLE CHEST - 1 VIEW

[AP]
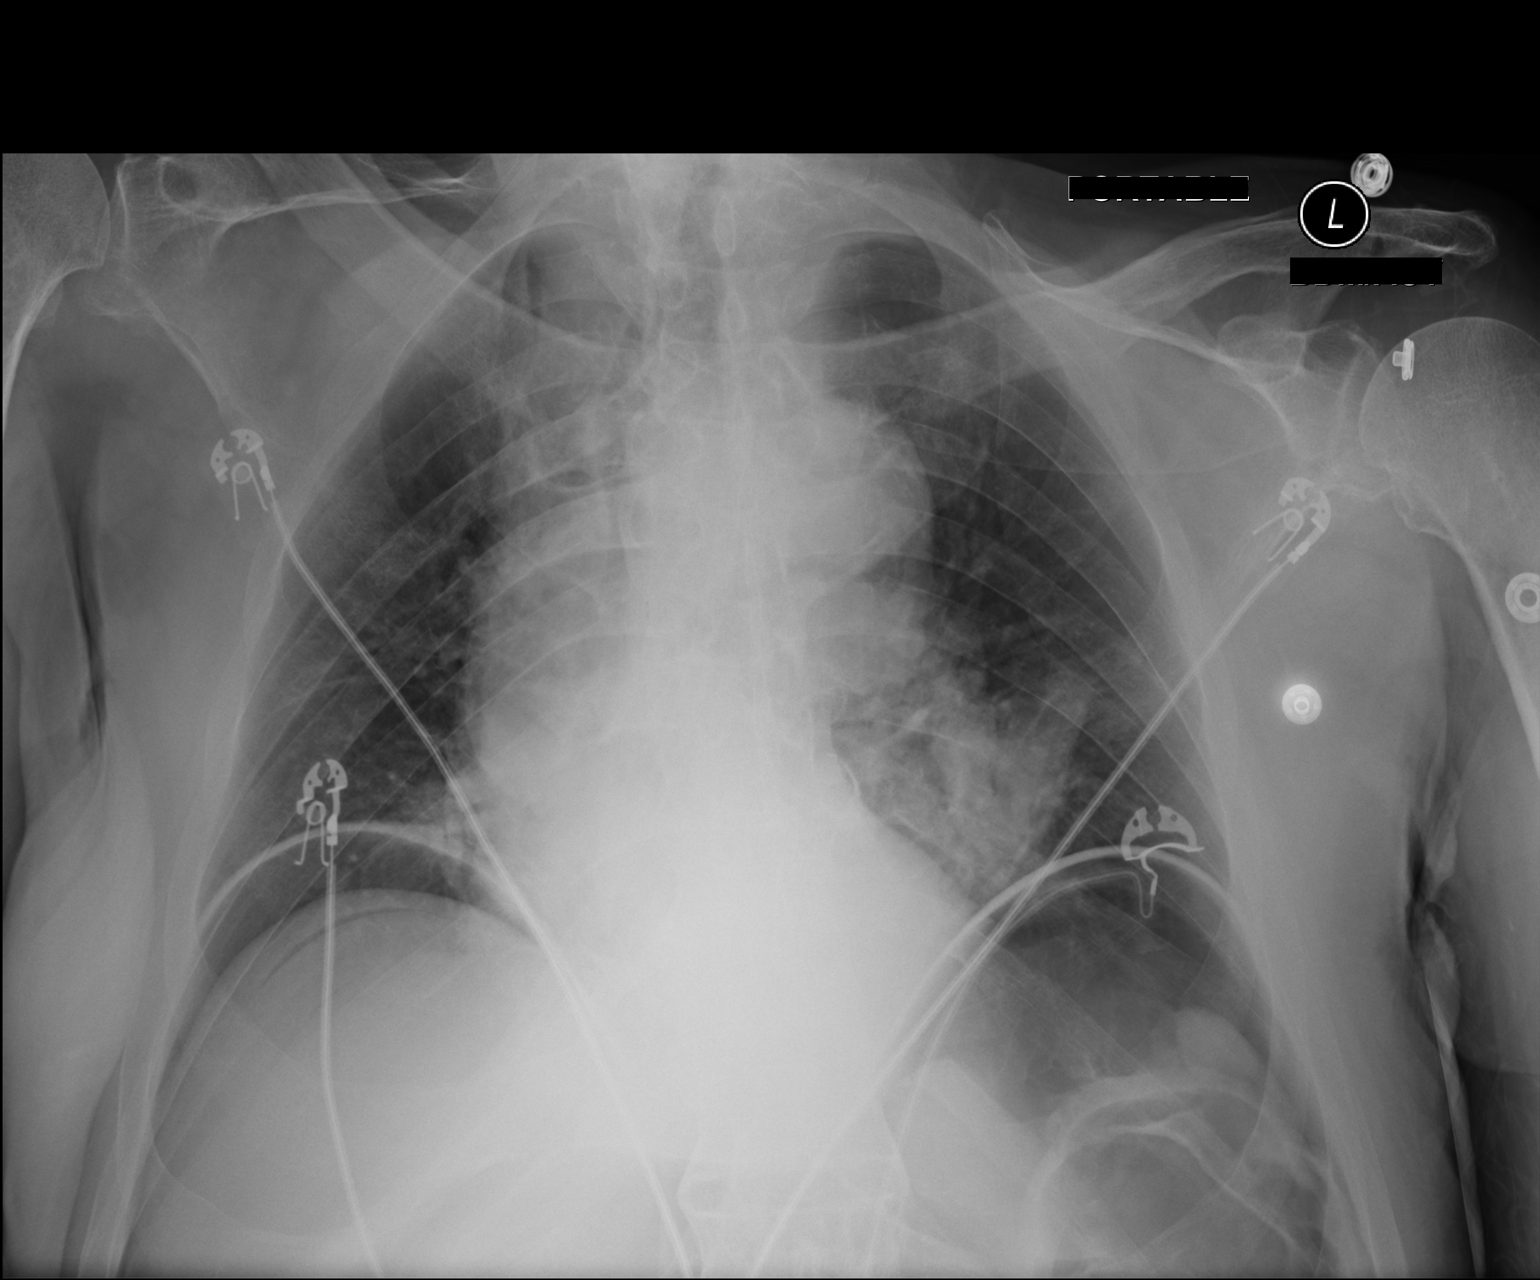

[1 of 1 positions shown; findings below may reference images not displayed]

FINDINGS: There is extensive pneumoperitoneum. There is consolidation in the
left lower lobe. Lungs elsewhere are clear. Heart size and pulmonary
vascularity are normal. Aorta is prominent but stable. There is no
appreciable adenopathy.
IMPRESSION: Extensive pneumoperitoneum.

Left lower lobe airspace consolidation.

Critical Value/emergent results were called by telephone at the time
of interpretation on 07/26/2013 at [DATE] to Dr. MICHAEL F BAUTISTA ,
who verbally acknowledged these results.
